# Patient Record
Sex: Female | Born: 1955 | Race: White | Hispanic: No | Smoking: Current every day smoker
Health system: Southern US, Community
[De-identification: ages and names within clinical notes are randomized; demographics above are authoritative.]

## PROBLEM LIST (undated history)

## (undated) DIAGNOSIS — F319 Bipolar disorder, unspecified: Secondary | ICD-10-CM

## (undated) DIAGNOSIS — F309 Manic episode, unspecified: Secondary | ICD-10-CM

## (undated) DIAGNOSIS — M6281 Muscle weakness (generalized): Secondary | ICD-10-CM

## (undated) DIAGNOSIS — F419 Anxiety disorder, unspecified: Secondary | ICD-10-CM

## (undated) DIAGNOSIS — R52 Pain, unspecified: Secondary | ICD-10-CM

## (undated) DIAGNOSIS — J449 Chronic obstructive pulmonary disease, unspecified: Secondary | ICD-10-CM

## (undated) DIAGNOSIS — J45909 Unspecified asthma, uncomplicated: Secondary | ICD-10-CM

## (undated) DIAGNOSIS — I10 Essential (primary) hypertension: Secondary | ICD-10-CM

## (undated) DIAGNOSIS — R569 Unspecified convulsions: Secondary | ICD-10-CM

## (undated) DIAGNOSIS — K5900 Constipation, unspecified: Secondary | ICD-10-CM

## (undated) HISTORY — DX: Anxiety disorder, unspecified: F41.9

## (undated) HISTORY — DX: Pain, unspecified: R52

## (undated) HISTORY — DX: Muscle weakness (generalized): M62.81

## (undated) HISTORY — DX: Unspecified asthma, uncomplicated: J45.909

## (undated) HISTORY — DX: Chronic obstructive pulmonary disease, unspecified: J44.9

## (undated) HISTORY — PX: APPENDECTOMY: SHX54

## (undated) HISTORY — PX: WRIST SURGERY: SHX841

## (undated) HISTORY — PX: OTHER SURGICAL HISTORY: SHX169

## (undated) HISTORY — DX: Manic episode, unspecified: F30.9

## (undated) HISTORY — DX: Essential (primary) hypertension: I10

## (undated) HISTORY — PX: BACK SURGERY: SHX140

---

## 2012-03-09 HISTORY — PX: HIP FRACTURE SURGERY: SHX118

## 2014-03-13 DIAGNOSIS — R52 Pain, unspecified: Secondary | ICD-10-CM | POA: Diagnosis not present

## 2014-03-13 DIAGNOSIS — M25511 Pain in right shoulder: Secondary | ICD-10-CM | POA: Diagnosis not present

## 2014-03-13 DIAGNOSIS — M25561 Pain in right knee: Secondary | ICD-10-CM | POA: Diagnosis not present

## 2014-03-13 DIAGNOSIS — M79621 Pain in right upper arm: Secondary | ICD-10-CM | POA: Diagnosis not present

## 2014-03-13 DIAGNOSIS — F319 Bipolar disorder, unspecified: Secondary | ICD-10-CM | POA: Diagnosis not present

## 2014-03-13 DIAGNOSIS — F4325 Adjustment disorder with mixed disturbance of emotions and conduct: Secondary | ICD-10-CM | POA: Diagnosis not present

## 2014-03-15 DIAGNOSIS — R404 Transient alteration of awareness: Secondary | ICD-10-CM | POA: Diagnosis not present

## 2014-03-15 DIAGNOSIS — F319 Bipolar disorder, unspecified: Secondary | ICD-10-CM | POA: Diagnosis not present

## 2014-03-15 DIAGNOSIS — Z720 Tobacco use: Secondary | ICD-10-CM | POA: Diagnosis not present

## 2014-03-20 DIAGNOSIS — R825 Elevated urine levels of drugs, medicaments and biological substances: Secondary | ICD-10-CM | POA: Diagnosis not present

## 2014-03-27 DIAGNOSIS — R400 Somnolence: Secondary | ICD-10-CM | POA: Diagnosis not present

## 2014-03-28 DIAGNOSIS — F4325 Adjustment disorder with mixed disturbance of emotions and conduct: Secondary | ICD-10-CM | POA: Diagnosis not present

## 2014-03-28 DIAGNOSIS — F319 Bipolar disorder, unspecified: Secondary | ICD-10-CM | POA: Diagnosis not present

## 2014-04-10 DIAGNOSIS — F319 Bipolar disorder, unspecified: Secondary | ICD-10-CM | POA: Diagnosis not present

## 2014-04-10 DIAGNOSIS — F4325 Adjustment disorder with mixed disturbance of emotions and conduct: Secondary | ICD-10-CM | POA: Diagnosis not present

## 2014-04-24 DIAGNOSIS — R52 Pain, unspecified: Secondary | ICD-10-CM | POA: Diagnosis not present

## 2014-04-24 DIAGNOSIS — K5909 Other constipation: Secondary | ICD-10-CM | POA: Diagnosis not present

## 2014-04-24 DIAGNOSIS — S82141D Displaced bicondylar fracture of right tibia, subsequent encounter for closed fracture with routine healing: Secondary | ICD-10-CM | POA: Diagnosis not present

## 2014-04-24 DIAGNOSIS — F319 Bipolar disorder, unspecified: Secondary | ICD-10-CM | POA: Diagnosis not present

## 2014-04-27 DIAGNOSIS — R52 Pain, unspecified: Secondary | ICD-10-CM | POA: Diagnosis not present

## 2014-04-27 DIAGNOSIS — F319 Bipolar disorder, unspecified: Secondary | ICD-10-CM | POA: Diagnosis not present

## 2014-04-27 DIAGNOSIS — J449 Chronic obstructive pulmonary disease, unspecified: Secondary | ICD-10-CM | POA: Diagnosis not present

## 2014-04-27 DIAGNOSIS — R251 Tremor, unspecified: Secondary | ICD-10-CM | POA: Diagnosis not present

## 2014-05-01 DIAGNOSIS — Z5181 Encounter for therapeutic drug level monitoring: Secondary | ICD-10-CM | POA: Diagnosis not present

## 2014-05-01 DIAGNOSIS — R0602 Shortness of breath: Secondary | ICD-10-CM | POA: Diagnosis not present

## 2014-05-01 DIAGNOSIS — F3132 Bipolar disorder, current episode depressed, moderate: Secondary | ICD-10-CM | POA: Diagnosis not present

## 2014-05-01 DIAGNOSIS — E785 Hyperlipidemia, unspecified: Secondary | ICD-10-CM | POA: Diagnosis not present

## 2014-05-01 DIAGNOSIS — Z79899 Other long term (current) drug therapy: Secondary | ICD-10-CM | POA: Diagnosis not present

## 2014-05-05 DIAGNOSIS — R030 Elevated blood-pressure reading, without diagnosis of hypertension: Secondary | ICD-10-CM | POA: Diagnosis not present

## 2014-05-05 DIAGNOSIS — G4700 Insomnia, unspecified: Secondary | ICD-10-CM | POA: Diagnosis not present

## 2014-05-05 DIAGNOSIS — R51 Headache: Secondary | ICD-10-CM | POA: Diagnosis not present

## 2014-05-05 DIAGNOSIS — Z79899 Other long term (current) drug therapy: Secondary | ICD-10-CM | POA: Diagnosis not present

## 2014-05-05 DIAGNOSIS — R531 Weakness: Secondary | ICD-10-CM | POA: Diagnosis not present

## 2014-05-05 DIAGNOSIS — G8929 Other chronic pain: Secondary | ICD-10-CM | POA: Diagnosis not present

## 2014-05-05 DIAGNOSIS — S0990XA Unspecified injury of head, initial encounter: Secondary | ICD-10-CM | POA: Diagnosis not present

## 2014-05-05 DIAGNOSIS — F419 Anxiety disorder, unspecified: Secondary | ICD-10-CM | POA: Diagnosis not present

## 2014-05-05 DIAGNOSIS — R262 Difficulty in walking, not elsewhere classified: Secondary | ICD-10-CM | POA: Diagnosis not present

## 2014-05-05 DIAGNOSIS — Z79891 Long term (current) use of opiate analgesic: Secondary | ICD-10-CM | POA: Diagnosis not present

## 2014-05-05 DIAGNOSIS — Z043 Encounter for examination and observation following other accident: Secondary | ICD-10-CM | POA: Diagnosis not present

## 2014-05-05 DIAGNOSIS — F329 Major depressive disorder, single episode, unspecified: Secondary | ICD-10-CM | POA: Diagnosis not present

## 2014-05-05 DIAGNOSIS — J449 Chronic obstructive pulmonary disease, unspecified: Secondary | ICD-10-CM | POA: Diagnosis not present

## 2014-05-05 DIAGNOSIS — Z720 Tobacco use: Secondary | ICD-10-CM | POA: Diagnosis not present

## 2014-05-06 DIAGNOSIS — Z79899 Other long term (current) drug therapy: Secondary | ICD-10-CM | POA: Diagnosis not present

## 2014-05-06 DIAGNOSIS — S40011A Contusion of right shoulder, initial encounter: Secondary | ICD-10-CM | POA: Diagnosis not present

## 2014-05-06 DIAGNOSIS — S4991XA Unspecified injury of right shoulder and upper arm, initial encounter: Secondary | ICD-10-CM | POA: Diagnosis not present

## 2014-05-06 DIAGNOSIS — R531 Weakness: Secondary | ICD-10-CM | POA: Diagnosis not present

## 2014-05-06 DIAGNOSIS — T426X1A Poisoning by other antiepileptic and sedative-hypnotic drugs, accidental (unintentional), initial encounter: Secondary | ICD-10-CM | POA: Diagnosis not present

## 2014-05-06 DIAGNOSIS — F419 Anxiety disorder, unspecified: Secondary | ICD-10-CM | POA: Diagnosis not present

## 2014-05-06 DIAGNOSIS — S0990XA Unspecified injury of head, initial encounter: Secondary | ICD-10-CM | POA: Diagnosis not present

## 2014-05-06 DIAGNOSIS — R7989 Other specified abnormal findings of blood chemistry: Secondary | ICD-10-CM | POA: Diagnosis not present

## 2014-05-06 DIAGNOSIS — S299XXA Unspecified injury of thorax, initial encounter: Secondary | ICD-10-CM | POA: Diagnosis not present

## 2014-05-06 DIAGNOSIS — J449 Chronic obstructive pulmonary disease, unspecified: Secondary | ICD-10-CM | POA: Diagnosis not present

## 2014-05-06 DIAGNOSIS — Z720 Tobacco use: Secondary | ICD-10-CM | POA: Diagnosis not present

## 2014-05-06 DIAGNOSIS — F319 Bipolar disorder, unspecified: Secondary | ICD-10-CM | POA: Diagnosis not present

## 2014-05-06 DIAGNOSIS — S20211A Contusion of right front wall of thorax, initial encounter: Secondary | ICD-10-CM | POA: Diagnosis not present

## 2014-05-06 DIAGNOSIS — G894 Chronic pain syndrome: Secondary | ICD-10-CM | POA: Diagnosis not present

## 2014-05-06 DIAGNOSIS — M25511 Pain in right shoulder: Secondary | ICD-10-CM | POA: Diagnosis not present

## 2014-05-07 DIAGNOSIS — F319 Bipolar disorder, unspecified: Secondary | ICD-10-CM | POA: Diagnosis not present

## 2014-05-07 DIAGNOSIS — F419 Anxiety disorder, unspecified: Secondary | ICD-10-CM | POA: Diagnosis not present

## 2014-05-08 DIAGNOSIS — K5900 Constipation, unspecified: Secondary | ICD-10-CM | POA: Diagnosis not present

## 2014-05-08 DIAGNOSIS — F319 Bipolar disorder, unspecified: Secondary | ICD-10-CM | POA: Diagnosis not present

## 2014-05-08 DIAGNOSIS — M6281 Muscle weakness (generalized): Secondary | ICD-10-CM | POA: Diagnosis not present

## 2014-05-08 DIAGNOSIS — G4700 Insomnia, unspecified: Secondary | ICD-10-CM | POA: Diagnosis not present

## 2014-05-08 DIAGNOSIS — Z01419 Encounter for gynecological examination (general) (routine) without abnormal findings: Secondary | ICD-10-CM | POA: Diagnosis not present

## 2014-05-08 DIAGNOSIS — F419 Anxiety disorder, unspecified: Secondary | ICD-10-CM | POA: Diagnosis not present

## 2014-05-08 DIAGNOSIS — Z9181 History of falling: Secondary | ICD-10-CM | POA: Diagnosis not present

## 2014-05-08 DIAGNOSIS — R262 Difficulty in walking, not elsewhere classified: Secondary | ICD-10-CM | POA: Diagnosis not present

## 2014-05-08 DIAGNOSIS — Z1231 Encounter for screening mammogram for malignant neoplasm of breast: Secondary | ICD-10-CM | POA: Diagnosis not present

## 2014-05-10 DIAGNOSIS — Z9181 History of falling: Secondary | ICD-10-CM | POA: Diagnosis not present

## 2014-05-10 DIAGNOSIS — R262 Difficulty in walking, not elsewhere classified: Secondary | ICD-10-CM | POA: Diagnosis not present

## 2014-05-10 DIAGNOSIS — Z1231 Encounter for screening mammogram for malignant neoplasm of breast: Secondary | ICD-10-CM | POA: Diagnosis not present

## 2014-05-10 DIAGNOSIS — R251 Tremor, unspecified: Secondary | ICD-10-CM | POA: Diagnosis not present

## 2014-05-10 DIAGNOSIS — M25521 Pain in right elbow: Secondary | ICD-10-CM | POA: Diagnosis not present

## 2014-05-10 DIAGNOSIS — M6281 Muscle weakness (generalized): Secondary | ICD-10-CM | POA: Diagnosis not present

## 2014-05-14 DIAGNOSIS — R262 Difficulty in walking, not elsewhere classified: Secondary | ICD-10-CM | POA: Diagnosis not present

## 2014-05-14 DIAGNOSIS — M6281 Muscle weakness (generalized): Secondary | ICD-10-CM | POA: Diagnosis not present

## 2014-05-14 DIAGNOSIS — Z9181 History of falling: Secondary | ICD-10-CM | POA: Diagnosis not present

## 2014-05-16 DIAGNOSIS — R262 Difficulty in walking, not elsewhere classified: Secondary | ICD-10-CM | POA: Diagnosis not present

## 2014-05-16 DIAGNOSIS — M6281 Muscle weakness (generalized): Secondary | ICD-10-CM | POA: Diagnosis not present

## 2014-05-16 DIAGNOSIS — R569 Unspecified convulsions: Secondary | ICD-10-CM | POA: Diagnosis not present

## 2014-05-16 DIAGNOSIS — Z9181 History of falling: Secondary | ICD-10-CM | POA: Diagnosis not present

## 2014-05-21 DIAGNOSIS — R262 Difficulty in walking, not elsewhere classified: Secondary | ICD-10-CM | POA: Diagnosis not present

## 2014-05-21 DIAGNOSIS — M6281 Muscle weakness (generalized): Secondary | ICD-10-CM | POA: Diagnosis not present

## 2014-05-21 DIAGNOSIS — Z9181 History of falling: Secondary | ICD-10-CM | POA: Diagnosis not present

## 2014-05-24 DIAGNOSIS — Z9181 History of falling: Secondary | ICD-10-CM | POA: Diagnosis not present

## 2014-05-24 DIAGNOSIS — M6281 Muscle weakness (generalized): Secondary | ICD-10-CM | POA: Diagnosis not present

## 2014-05-24 DIAGNOSIS — R262 Difficulty in walking, not elsewhere classified: Secondary | ICD-10-CM | POA: Diagnosis not present

## 2014-05-28 DIAGNOSIS — F419 Anxiety disorder, unspecified: Secondary | ICD-10-CM | POA: Diagnosis not present

## 2014-05-28 DIAGNOSIS — F319 Bipolar disorder, unspecified: Secondary | ICD-10-CM | POA: Diagnosis not present

## 2014-05-29 DIAGNOSIS — M6281 Muscle weakness (generalized): Secondary | ICD-10-CM | POA: Diagnosis not present

## 2014-05-29 DIAGNOSIS — R262 Difficulty in walking, not elsewhere classified: Secondary | ICD-10-CM | POA: Diagnosis not present

## 2014-05-30 DIAGNOSIS — F3132 Bipolar disorder, current episode depressed, moderate: Secondary | ICD-10-CM | POA: Diagnosis not present

## 2014-05-30 DIAGNOSIS — Z79899 Other long term (current) drug therapy: Secondary | ICD-10-CM | POA: Diagnosis not present

## 2014-05-30 DIAGNOSIS — Z5181 Encounter for therapeutic drug level monitoring: Secondary | ICD-10-CM | POA: Diagnosis not present

## 2014-05-30 DIAGNOSIS — R0602 Shortness of breath: Secondary | ICD-10-CM | POA: Diagnosis not present

## 2014-05-30 DIAGNOSIS — E785 Hyperlipidemia, unspecified: Secondary | ICD-10-CM | POA: Diagnosis not present

## 2014-06-06 DIAGNOSIS — Z79891 Long term (current) use of opiate analgesic: Secondary | ICD-10-CM | POA: Diagnosis not present

## 2014-06-06 DIAGNOSIS — S8991XA Unspecified injury of right lower leg, initial encounter: Secondary | ICD-10-CM | POA: Diagnosis not present

## 2014-06-06 DIAGNOSIS — Z23 Encounter for immunization: Secondary | ICD-10-CM | POA: Diagnosis not present

## 2014-06-06 DIAGNOSIS — Z7982 Long term (current) use of aspirin: Secondary | ICD-10-CM | POA: Diagnosis not present

## 2014-06-06 DIAGNOSIS — D62 Acute posthemorrhagic anemia: Secondary | ICD-10-CM | POA: Diagnosis not present

## 2014-06-06 DIAGNOSIS — W19XXXD Unspecified fall, subsequent encounter: Secondary | ICD-10-CM | POA: Diagnosis not present

## 2014-06-06 DIAGNOSIS — S299XXA Unspecified injury of thorax, initial encounter: Secondary | ICD-10-CM | POA: Diagnosis not present

## 2014-06-06 DIAGNOSIS — R1311 Dysphagia, oral phase: Secondary | ICD-10-CM | POA: Diagnosis not present

## 2014-06-06 DIAGNOSIS — S72414A Nondisplaced unspecified condyle fracture of lower end of right femur, initial encounter for closed fracture: Secondary | ICD-10-CM | POA: Diagnosis not present

## 2014-06-06 DIAGNOSIS — S72141A Displaced intertrochanteric fracture of right femur, initial encounter for closed fracture: Secondary | ICD-10-CM | POA: Diagnosis not present

## 2014-06-06 DIAGNOSIS — I739 Peripheral vascular disease, unspecified: Secondary | ICD-10-CM | POA: Diagnosis not present

## 2014-06-06 DIAGNOSIS — W19XXXA Unspecified fall, initial encounter: Secondary | ICD-10-CM | POA: Diagnosis not present

## 2014-06-06 DIAGNOSIS — R404 Transient alteration of awareness: Secondary | ICD-10-CM | POA: Diagnosis not present

## 2014-06-06 DIAGNOSIS — M25561 Pain in right knee: Secondary | ICD-10-CM | POA: Diagnosis not present

## 2014-06-06 DIAGNOSIS — S199XXA Unspecified injury of neck, initial encounter: Secondary | ICD-10-CM | POA: Diagnosis not present

## 2014-06-06 DIAGNOSIS — Z8709 Personal history of other diseases of the respiratory system: Secondary | ICD-10-CM | POA: Diagnosis not present

## 2014-06-06 DIAGNOSIS — S79911A Unspecified injury of right hip, initial encounter: Secondary | ICD-10-CM | POA: Diagnosis not present

## 2014-06-06 DIAGNOSIS — Z87891 Personal history of nicotine dependence: Secondary | ICD-10-CM | POA: Diagnosis not present

## 2014-06-06 DIAGNOSIS — M6281 Muscle weakness (generalized): Secondary | ICD-10-CM | POA: Diagnosis not present

## 2014-06-06 DIAGNOSIS — F418 Other specified anxiety disorders: Secondary | ICD-10-CM | POA: Diagnosis not present

## 2014-06-06 DIAGNOSIS — Z79899 Other long term (current) drug therapy: Secondary | ICD-10-CM | POA: Diagnosis not present

## 2014-06-06 DIAGNOSIS — F0390 Unspecified dementia without behavioral disturbance: Secondary | ICD-10-CM | POA: Diagnosis not present

## 2014-06-06 DIAGNOSIS — J449 Chronic obstructive pulmonary disease, unspecified: Secondary | ICD-10-CM | POA: Diagnosis not present

## 2014-06-06 DIAGNOSIS — M25559 Pain in unspecified hip: Secondary | ICD-10-CM | POA: Diagnosis not present

## 2014-06-06 DIAGNOSIS — R262 Difficulty in walking, not elsewhere classified: Secondary | ICD-10-CM | POA: Diagnosis not present

## 2014-06-06 DIAGNOSIS — Z9181 History of falling: Secondary | ICD-10-CM | POA: Diagnosis not present

## 2014-06-06 DIAGNOSIS — D649 Anemia, unspecified: Secondary | ICD-10-CM | POA: Diagnosis not present

## 2014-06-06 DIAGNOSIS — Z7401 Bed confinement status: Secondary | ICD-10-CM | POA: Diagnosis not present

## 2014-06-06 DIAGNOSIS — F319 Bipolar disorder, unspecified: Secondary | ICD-10-CM | POA: Diagnosis not present

## 2014-06-06 DIAGNOSIS — S72141D Displaced intertrochanteric fracture of right femur, subsequent encounter for closed fracture with routine healing: Secondary | ICD-10-CM | POA: Diagnosis not present

## 2014-06-12 DIAGNOSIS — I10 Essential (primary) hypertension: Secondary | ICD-10-CM | POA: Diagnosis not present

## 2014-06-12 DIAGNOSIS — D62 Acute posthemorrhagic anemia: Secondary | ICD-10-CM | POA: Diagnosis not present

## 2014-06-12 DIAGNOSIS — F411 Generalized anxiety disorder: Secondary | ICD-10-CM | POA: Diagnosis not present

## 2014-06-12 DIAGNOSIS — Z8709 Personal history of other diseases of the respiratory system: Secondary | ICD-10-CM | POA: Diagnosis not present

## 2014-06-12 DIAGNOSIS — S72141A Displaced intertrochanteric fracture of right femur, initial encounter for closed fracture: Secondary | ICD-10-CM | POA: Diagnosis not present

## 2014-06-12 DIAGNOSIS — M6281 Muscle weakness (generalized): Secondary | ICD-10-CM | POA: Diagnosis not present

## 2014-06-12 DIAGNOSIS — Z7401 Bed confinement status: Secondary | ICD-10-CM | POA: Diagnosis not present

## 2014-06-12 DIAGNOSIS — J449 Chronic obstructive pulmonary disease, unspecified: Secondary | ICD-10-CM | POA: Diagnosis not present

## 2014-06-12 DIAGNOSIS — W19XXXD Unspecified fall, subsequent encounter: Secondary | ICD-10-CM | POA: Diagnosis not present

## 2014-06-12 DIAGNOSIS — G20 Parkinson's disease: Secondary | ICD-10-CM | POA: Diagnosis not present

## 2014-06-12 DIAGNOSIS — I2510 Atherosclerotic heart disease of native coronary artery without angina pectoris: Secondary | ICD-10-CM | POA: Diagnosis not present

## 2014-06-12 DIAGNOSIS — S72414A Nondisplaced unspecified condyle fracture of lower end of right femur, initial encounter for closed fracture: Secondary | ICD-10-CM | POA: Diagnosis not present

## 2014-06-12 DIAGNOSIS — Z87891 Personal history of nicotine dependence: Secondary | ICD-10-CM | POA: Diagnosis not present

## 2014-06-12 DIAGNOSIS — Z79899 Other long term (current) drug therapy: Secondary | ICD-10-CM | POA: Diagnosis not present

## 2014-06-12 DIAGNOSIS — D649 Anemia, unspecified: Secondary | ICD-10-CM | POA: Diagnosis not present

## 2014-06-12 DIAGNOSIS — S72141D Displaced intertrochanteric fracture of right femur, subsequent encounter for closed fracture with routine healing: Secondary | ICD-10-CM | POA: Diagnosis not present

## 2014-06-12 DIAGNOSIS — Z9181 History of falling: Secondary | ICD-10-CM | POA: Diagnosis not present

## 2014-06-12 DIAGNOSIS — R262 Difficulty in walking, not elsewhere classified: Secondary | ICD-10-CM | POA: Diagnosis not present

## 2014-06-12 DIAGNOSIS — F418 Other specified anxiety disorders: Secondary | ICD-10-CM | POA: Diagnosis not present

## 2014-06-12 DIAGNOSIS — M25559 Pain in unspecified hip: Secondary | ICD-10-CM | POA: Diagnosis not present

## 2014-06-12 DIAGNOSIS — F319 Bipolar disorder, unspecified: Secondary | ICD-10-CM | POA: Diagnosis not present

## 2014-06-12 DIAGNOSIS — Z7982 Long term (current) use of aspirin: Secondary | ICD-10-CM | POA: Diagnosis not present

## 2014-06-12 DIAGNOSIS — Z79891 Long term (current) use of opiate analgesic: Secondary | ICD-10-CM | POA: Diagnosis not present

## 2014-06-12 DIAGNOSIS — R1311 Dysphagia, oral phase: Secondary | ICD-10-CM | POA: Diagnosis not present

## 2014-06-12 DIAGNOSIS — R404 Transient alteration of awareness: Secondary | ICD-10-CM | POA: Diagnosis not present

## 2014-06-12 DIAGNOSIS — Z23 Encounter for immunization: Secondary | ICD-10-CM | POA: Diagnosis not present

## 2014-06-14 DIAGNOSIS — S72141D Displaced intertrochanteric fracture of right femur, subsequent encounter for closed fracture with routine healing: Secondary | ICD-10-CM | POA: Diagnosis not present

## 2014-06-14 DIAGNOSIS — I10 Essential (primary) hypertension: Secondary | ICD-10-CM | POA: Diagnosis not present

## 2014-06-14 DIAGNOSIS — D649 Anemia, unspecified: Secondary | ICD-10-CM | POA: Diagnosis not present

## 2014-06-14 DIAGNOSIS — J449 Chronic obstructive pulmonary disease, unspecified: Secondary | ICD-10-CM | POA: Diagnosis not present

## 2014-07-04 DIAGNOSIS — G20 Parkinson's disease: Secondary | ICD-10-CM | POA: Diagnosis not present

## 2014-07-04 DIAGNOSIS — F411 Generalized anxiety disorder: Secondary | ICD-10-CM | POA: Diagnosis not present

## 2014-07-04 DIAGNOSIS — I10 Essential (primary) hypertension: Secondary | ICD-10-CM | POA: Diagnosis not present

## 2014-07-04 DIAGNOSIS — S72141D Displaced intertrochanteric fracture of right femur, subsequent encounter for closed fracture with routine healing: Secondary | ICD-10-CM | POA: Diagnosis not present

## 2014-07-09 DIAGNOSIS — D649 Anemia, unspecified: Secondary | ICD-10-CM | POA: Diagnosis not present

## 2014-07-09 DIAGNOSIS — I2510 Atherosclerotic heart disease of native coronary artery without angina pectoris: Secondary | ICD-10-CM | POA: Diagnosis not present

## 2014-07-09 DIAGNOSIS — F319 Bipolar disorder, unspecified: Secondary | ICD-10-CM | POA: Diagnosis not present

## 2014-07-12 DIAGNOSIS — S72141D Displaced intertrochanteric fracture of right femur, subsequent encounter for closed fracture with routine healing: Secondary | ICD-10-CM | POA: Diagnosis not present

## 2014-07-12 DIAGNOSIS — I10 Essential (primary) hypertension: Secondary | ICD-10-CM | POA: Diagnosis not present

## 2014-07-12 DIAGNOSIS — J449 Chronic obstructive pulmonary disease, unspecified: Secondary | ICD-10-CM | POA: Diagnosis not present

## 2014-07-12 DIAGNOSIS — D649 Anemia, unspecified: Secondary | ICD-10-CM | POA: Diagnosis not present

## 2014-07-17 DIAGNOSIS — M25552 Pain in left hip: Secondary | ICD-10-CM | POA: Diagnosis not present

## 2014-07-17 DIAGNOSIS — S299XXA Unspecified injury of thorax, initial encounter: Secondary | ICD-10-CM | POA: Diagnosis not present

## 2014-07-17 DIAGNOSIS — S79911A Unspecified injury of right hip, initial encounter: Secondary | ICD-10-CM | POA: Diagnosis not present

## 2014-07-17 DIAGNOSIS — Z9181 History of falling: Secondary | ICD-10-CM | POA: Diagnosis not present

## 2014-07-17 DIAGNOSIS — S7001XA Contusion of right hip, initial encounter: Secondary | ICD-10-CM | POA: Diagnosis not present

## 2014-07-17 DIAGNOSIS — S79912A Unspecified injury of left hip, initial encounter: Secondary | ICD-10-CM | POA: Diagnosis not present

## 2014-07-17 DIAGNOSIS — R51 Headache: Secondary | ICD-10-CM | POA: Diagnosis not present

## 2014-07-17 DIAGNOSIS — G319 Degenerative disease of nervous system, unspecified: Secondary | ICD-10-CM | POA: Diagnosis not present

## 2014-07-17 DIAGNOSIS — S0990XA Unspecified injury of head, initial encounter: Secondary | ICD-10-CM | POA: Diagnosis not present

## 2014-07-17 DIAGNOSIS — M79651 Pain in right thigh: Secondary | ICD-10-CM | POA: Diagnosis not present

## 2014-07-17 DIAGNOSIS — S79921A Unspecified injury of right thigh, initial encounter: Secondary | ICD-10-CM | POA: Diagnosis not present

## 2014-07-17 DIAGNOSIS — M25551 Pain in right hip: Secondary | ICD-10-CM | POA: Diagnosis not present

## 2014-07-26 DIAGNOSIS — F319 Bipolar disorder, unspecified: Secondary | ICD-10-CM | POA: Diagnosis not present

## 2014-07-26 DIAGNOSIS — F419 Anxiety disorder, unspecified: Secondary | ICD-10-CM | POA: Diagnosis not present

## 2014-07-27 DIAGNOSIS — J449 Chronic obstructive pulmonary disease, unspecified: Secondary | ICD-10-CM | POA: Diagnosis not present

## 2014-07-27 DIAGNOSIS — R269 Unspecified abnormalities of gait and mobility: Secondary | ICD-10-CM | POA: Diagnosis not present

## 2014-07-27 DIAGNOSIS — M25551 Pain in right hip: Secondary | ICD-10-CM | POA: Diagnosis not present

## 2014-07-27 DIAGNOSIS — F319 Bipolar disorder, unspecified: Secondary | ICD-10-CM | POA: Diagnosis not present

## 2014-08-23 DIAGNOSIS — F319 Bipolar disorder, unspecified: Secondary | ICD-10-CM | POA: Diagnosis not present

## 2014-08-23 DIAGNOSIS — I10 Essential (primary) hypertension: Secondary | ICD-10-CM | POA: Diagnosis not present

## 2014-08-23 DIAGNOSIS — R251 Tremor, unspecified: Secondary | ICD-10-CM | POA: Diagnosis not present

## 2014-08-23 DIAGNOSIS — J449 Chronic obstructive pulmonary disease, unspecified: Secondary | ICD-10-CM | POA: Diagnosis not present

## 2014-08-27 DIAGNOSIS — B351 Tinea unguium: Secondary | ICD-10-CM | POA: Diagnosis not present

## 2014-08-27 DIAGNOSIS — E7220 Disorder of urea cycle metabolism, unspecified: Secondary | ICD-10-CM | POA: Diagnosis not present

## 2014-08-27 DIAGNOSIS — Z79899 Other long term (current) drug therapy: Secondary | ICD-10-CM | POA: Diagnosis not present

## 2014-08-27 DIAGNOSIS — Z5181 Encounter for therapeutic drug level monitoring: Secondary | ICD-10-CM | POA: Diagnosis not present

## 2014-08-27 DIAGNOSIS — M79675 Pain in left toe(s): Secondary | ICD-10-CM | POA: Diagnosis not present

## 2014-08-27 DIAGNOSIS — M79674 Pain in right toe(s): Secondary | ICD-10-CM | POA: Diagnosis not present

## 2014-08-27 DIAGNOSIS — I10 Essential (primary) hypertension: Secondary | ICD-10-CM | POA: Diagnosis not present

## 2014-09-03 DIAGNOSIS — F319 Bipolar disorder, unspecified: Secondary | ICD-10-CM | POA: Diagnosis not present

## 2014-09-03 DIAGNOSIS — F419 Anxiety disorder, unspecified: Secondary | ICD-10-CM | POA: Diagnosis not present

## 2014-09-28 DIAGNOSIS — J449 Chronic obstructive pulmonary disease, unspecified: Secondary | ICD-10-CM | POA: Diagnosis not present

## 2014-09-28 DIAGNOSIS — I10 Essential (primary) hypertension: Secondary | ICD-10-CM | POA: Diagnosis not present

## 2014-10-03 DIAGNOSIS — F319 Bipolar disorder, unspecified: Secondary | ICD-10-CM | POA: Diagnosis not present

## 2014-10-03 DIAGNOSIS — F419 Anxiety disorder, unspecified: Secondary | ICD-10-CM | POA: Diagnosis not present

## 2014-10-19 DIAGNOSIS — S62613A Displaced fracture of proximal phalanx of left middle finger, initial encounter for closed fracture: Secondary | ICD-10-CM | POA: Diagnosis not present

## 2014-10-19 DIAGNOSIS — S0180XA Unspecified open wound of other part of head, initial encounter: Secondary | ICD-10-CM | POA: Diagnosis not present

## 2014-10-19 DIAGNOSIS — S0990XA Unspecified injury of head, initial encounter: Secondary | ICD-10-CM | POA: Diagnosis not present

## 2014-10-19 DIAGNOSIS — F419 Anxiety disorder, unspecified: Secondary | ICD-10-CM | POA: Diagnosis not present

## 2014-10-19 DIAGNOSIS — S6992XA Unspecified injury of left wrist, hand and finger(s), initial encounter: Secondary | ICD-10-CM | POA: Diagnosis not present

## 2014-10-19 DIAGNOSIS — S62623A Displaced fracture of medial phalanx of left middle finger, initial encounter for closed fracture: Secondary | ICD-10-CM | POA: Diagnosis not present

## 2014-10-19 DIAGNOSIS — F319 Bipolar disorder, unspecified: Secondary | ICD-10-CM | POA: Diagnosis not present

## 2014-10-19 DIAGNOSIS — Z79899 Other long term (current) drug therapy: Secondary | ICD-10-CM | POA: Diagnosis not present

## 2014-10-19 DIAGNOSIS — S0101XA Laceration without foreign body of scalp, initial encounter: Secondary | ICD-10-CM | POA: Diagnosis not present

## 2014-10-19 DIAGNOSIS — J449 Chronic obstructive pulmonary disease, unspecified: Secondary | ICD-10-CM | POA: Diagnosis not present

## 2014-10-23 DIAGNOSIS — S7001XA Contusion of right hip, initial encounter: Secondary | ICD-10-CM | POA: Diagnosis not present

## 2014-10-23 DIAGNOSIS — M25521 Pain in right elbow: Secondary | ICD-10-CM | POA: Diagnosis not present

## 2014-10-23 DIAGNOSIS — S61209A Unspecified open wound of unspecified finger without damage to nail, initial encounter: Secondary | ICD-10-CM | POA: Diagnosis not present

## 2014-10-23 DIAGNOSIS — M25551 Pain in right hip: Secondary | ICD-10-CM | POA: Diagnosis not present

## 2014-10-25 DIAGNOSIS — S0100XD Unspecified open wound of scalp, subsequent encounter: Secondary | ICD-10-CM | POA: Diagnosis not present

## 2014-10-25 DIAGNOSIS — S7001XD Contusion of right hip, subsequent encounter: Secondary | ICD-10-CM | POA: Diagnosis not present

## 2014-10-25 DIAGNOSIS — I10 Essential (primary) hypertension: Secondary | ICD-10-CM | POA: Diagnosis not present

## 2014-10-30 DIAGNOSIS — F319 Bipolar disorder, unspecified: Secondary | ICD-10-CM | POA: Diagnosis not present

## 2014-10-30 DIAGNOSIS — F419 Anxiety disorder, unspecified: Secondary | ICD-10-CM | POA: Diagnosis not present

## 2014-11-02 DIAGNOSIS — F419 Anxiety disorder, unspecified: Secondary | ICD-10-CM | POA: Diagnosis not present

## 2014-11-02 DIAGNOSIS — F319 Bipolar disorder, unspecified: Secondary | ICD-10-CM | POA: Diagnosis not present

## 2014-11-06 DIAGNOSIS — S61209D Unspecified open wound of unspecified finger without damage to nail, subsequent encounter: Secondary | ICD-10-CM | POA: Diagnosis not present

## 2014-11-06 DIAGNOSIS — F419 Anxiety disorder, unspecified: Secondary | ICD-10-CM | POA: Diagnosis not present

## 2014-11-06 DIAGNOSIS — F319 Bipolar disorder, unspecified: Secondary | ICD-10-CM | POA: Diagnosis not present

## 2014-11-06 DIAGNOSIS — M79674 Pain in right toe(s): Secondary | ICD-10-CM | POA: Diagnosis not present

## 2014-11-06 DIAGNOSIS — B351 Tinea unguium: Secondary | ICD-10-CM | POA: Diagnosis not present

## 2014-11-06 DIAGNOSIS — S7001XD Contusion of right hip, subsequent encounter: Secondary | ICD-10-CM | POA: Diagnosis not present

## 2014-11-06 DIAGNOSIS — M25521 Pain in right elbow: Secondary | ICD-10-CM | POA: Diagnosis not present

## 2014-11-06 DIAGNOSIS — M79675 Pain in left toe(s): Secondary | ICD-10-CM | POA: Diagnosis not present

## 2014-11-07 DIAGNOSIS — F419 Anxiety disorder, unspecified: Secondary | ICD-10-CM | POA: Diagnosis not present

## 2014-11-07 DIAGNOSIS — F319 Bipolar disorder, unspecified: Secondary | ICD-10-CM | POA: Diagnosis not present

## 2014-11-13 DIAGNOSIS — F319 Bipolar disorder, unspecified: Secondary | ICD-10-CM | POA: Diagnosis not present

## 2014-11-13 DIAGNOSIS — F419 Anxiety disorder, unspecified: Secondary | ICD-10-CM | POA: Diagnosis not present

## 2014-12-05 DIAGNOSIS — J449 Chronic obstructive pulmonary disease, unspecified: Secondary | ICD-10-CM | POA: Diagnosis not present

## 2014-12-05 DIAGNOSIS — M6281 Muscle weakness (generalized): Secondary | ICD-10-CM | POA: Diagnosis not present

## 2014-12-05 DIAGNOSIS — F319 Bipolar disorder, unspecified: Secondary | ICD-10-CM | POA: Diagnosis not present

## 2014-12-05 DIAGNOSIS — F17200 Nicotine dependence, unspecified, uncomplicated: Secondary | ICD-10-CM | POA: Diagnosis not present

## 2014-12-20 ENCOUNTER — Encounter (HOSPITAL_COMMUNITY): Payer: Self-pay

## 2014-12-20 ENCOUNTER — Emergency Department (HOSPITAL_COMMUNITY): Payer: Medicare Other

## 2014-12-20 ENCOUNTER — Emergency Department (HOSPITAL_COMMUNITY)
Admission: EM | Admit: 2014-12-20 | Discharge: 2014-12-20 | Disposition: A | Discharge status: Home / Self Care | Payer: Medicare Other | Attending: Emergency Medicine | Admitting: Emergency Medicine

## 2014-12-20 DIAGNOSIS — Z720 Tobacco use: Secondary | ICD-10-CM | POA: Diagnosis not present

## 2014-12-20 DIAGNOSIS — S20212A Contusion of left front wall of thorax, initial encounter: Secondary | ICD-10-CM | POA: Insufficient documentation

## 2014-12-20 DIAGNOSIS — F319 Bipolar disorder, unspecified: Secondary | ICD-10-CM | POA: Insufficient documentation

## 2014-12-20 DIAGNOSIS — Y92007 Garden or yard of unspecified non-institutional (private) residence as the place of occurrence of the external cause: Secondary | ICD-10-CM | POA: Insufficient documentation

## 2014-12-20 DIAGNOSIS — Z79899 Other long term (current) drug therapy: Secondary | ICD-10-CM | POA: Insufficient documentation

## 2014-12-20 DIAGNOSIS — Y998 Other external cause status: Secondary | ICD-10-CM | POA: Insufficient documentation

## 2014-12-20 DIAGNOSIS — Y9389 Activity, other specified: Secondary | ICD-10-CM | POA: Diagnosis not present

## 2014-12-20 DIAGNOSIS — W010XXA Fall on same level from slipping, tripping and stumbling without subsequent striking against object, initial encounter: Secondary | ICD-10-CM | POA: Diagnosis not present

## 2014-12-20 DIAGNOSIS — S299XXA Unspecified injury of thorax, initial encounter: Secondary | ICD-10-CM | POA: Diagnosis present

## 2014-12-20 DIAGNOSIS — Z7982 Long term (current) use of aspirin: Secondary | ICD-10-CM | POA: Diagnosis not present

## 2014-12-20 DIAGNOSIS — J449 Chronic obstructive pulmonary disease, unspecified: Secondary | ICD-10-CM | POA: Diagnosis not present

## 2014-12-20 HISTORY — DX: Bipolar disorder, unspecified: F31.9

## 2014-12-20 MED ORDER — HYDROCODONE-ACETAMINOPHEN 5-325 MG PO TABS
1.0000 | ORAL_TABLET | Freq: Once | ORAL | Status: AC
  Administered 2014-12-20: 1 via ORAL
  Filled 2014-12-20: qty 1

## 2014-12-20 NOTE — ED Notes (Signed)
Patient states she tripped in her yard and fell onto her left side. Patient c/o rib pain and sob. Denies any other injuries.

## 2014-12-20 NOTE — Discharge Instructions (Signed)
Contusion There is no broken ribs.  Use your tramadol as needed for pain. Followup with your doctor. Return to the ED if you develop new or worsening symptoms. A contusion is a deep bruise. Contusions are the result of a blunt injury to tissues and muscle fibers under the skin. The injury causes bleeding under the skin. The skin overlying the contusion may turn blue, purple, or yellow. Minor injuries will give you a painless contusion, but more severe contusions may stay painful and swollen for a few weeks.  CAUSES  This condition is usually caused by a blow, trauma, or direct force to an area of the body. SYMPTOMS  Symptoms of this condition include:  Swelling of the injured area.  Pain and tenderness in the injured area.  Discoloration. The area may have redness and then turn blue, purple, or yellow. DIAGNOSIS  This condition is diagnosed based on a physical exam and medical history. An X-ray, CT scan, or MRI may be needed to determine if there are any associated injuries, such as broken bones (fractures). TREATMENT  Specific treatment for this condition depends on what area of the body was injured. In general, the best treatment for a contusion is resting, icing, applying pressure to (compression), and elevating the injured area. This is often called the RICE strategy. Over-the-counter anti-inflammatory medicines may also be recommended for pain control.  HOME CARE INSTRUCTIONS   Rest the injured area.  If directed, apply ice to the injured area:  Put ice in a plastic bag.  Place a towel between your skin and the bag.  Leave the ice on for 20 minutes, 2-3 times per day.  If directed, apply light compression to the injured area using an elastic bandage. Make sure the bandage is not wrapped too tightly. Remove and reapply the bandage as directed by your health care provider.  If possible, raise (elevate) the injured area above the level of your heart while you are sitting or lying  down.  Take over-the-counter and prescription medicines only as told by your health care provider. SEEK MEDICAL CARE IF:  Your symptoms do not improve after several days of treatment.  Your symptoms get worse.  You have difficulty moving the injured area. SEEK IMMEDIATE MEDICAL CARE IF:   You have severe pain.  You have numbness in a hand or foot.  Your hand or foot turns pale or cold.   This information is not intended to replace advice given to you by your health care provider. Make sure you discuss any questions you have with your health care provider.   Document Released: 12/03/2004 Document Revised: 11/14/2014 Document Reviewed: 07/11/2014 Elsevier Interactive Patient Education [employer].

## 2014-12-20 NOTE — ED Notes (Signed)
Pt alert & oriented x4, stable gait. Patient  given discharge instructions, paperwork & prescription(s). Patient verbalized understanding. Pt left department w/ no further questions. 

## 2014-12-20 NOTE — ED Provider Notes (Signed)
CSN: [id number]     Arrival date & time 12/20/14  1939 History   First MD Initiated Contact with Patient 12/20/14 2032     Chief Complaint  Patient presents with  . Fall     (Consider location/radiation/quality/duration/timing/severity/associated sxs/prior Treatment) HPI Comments: Mechanical fall over a metal pipe tripping and landing on left side. This happened around 10 AM. Did not hit head or lose consciousness. Complains of pain to left breast, ribs or shortness of breath. Taking Tylenol without relief. Denies head, neck, back, chest or abdominal pain. No blood thinner use.  Patient is a 59 y.o. female presenting with fall. The history is provided by the patient.  Fall Pertinent negatives include no chest pain, no abdominal pain, no headaches and no shortness of breath.    Past Medical History  Diagnosis Date  . Bipolar 1 disorder [facility])    Past Surgical History  Procedure Laterality Date  . Back surgery    . Appendectomy     History reviewed. No pertinent family history. Social History  Substance Use Topics  . Smoking status: Current Every Day Smoker -- 1.00 packs/day    Types: Cigarettes  . Smokeless tobacco: None  . Alcohol Use: No   OB History    No data available     Review of Systems  Constitutional: Negative for fever, activity change and appetite change.  HENT: Negative for congestion and postnasal drip.   Eyes: Negative for visual disturbance.  Respiratory: Negative for cough, chest tightness and shortness of breath.   Cardiovascular: Negative for chest pain and leg swelling.  Gastrointestinal: Negative for nausea, vomiting and abdominal pain.  Genitourinary: Negative for dysuria, hematuria, vaginal bleeding and vaginal discharge.  Musculoskeletal: Positive for myalgias and arthralgias. Negative for back pain.  Skin: Negative for wound.  Neurological: Negative for dizziness, syncope, weakness and headaches.  A complete 10 system review of systems was  obtained and all systems are negative except as noted in the HPI and PMH.      Allergies  Neurontin and Septra ds  Home Medications   Prior to Admission medications   Medication Sig Start Date End Date Taking? Authorizing Provider  acetaminophen (TYLENOL) 325 MG tablet Take 975 mg by mouth every 8 (eight) hours as needed for mild pain.   Yes Historical Provider, MD  albuterol (PROVENTIL HFA;VENTOLIN HFA) 108 (90 BASE) MCG/ACT inhaler Inhale 2 puffs into the lungs every 6 (six) hours as needed for wheezing or shortness of breath.   Yes Historical Provider, MD  aspirin 325 MG EC tablet Take 325 mg by mouth 2 (two) times daily.   Yes Historical Provider, MD  B Complex-Folic Acid (B COMPLEX-VITAMIN B12 PO) Take 1 tablet by mouth daily.   Yes Historical Provider, MD  buPROPion (WELLBUTRIN XL) 300 MG 24 hr tablet Take 300 mg by mouth daily.   Yes Historical Provider, MD  calcium-vitamin D (OSCAL WITH D) 500-200 MG-UNIT tablet Take 1 tablet by mouth 2 (two) times daily.   Yes Historical Provider, MD  carbidopa-levodopa (SINEMET IR) 10-100 MG tablet Take 1 tablet by mouth 3 (three) times daily.   Yes Historical Provider, MD  divalproex (DEPAKOTE SPRINKLES) 125 MG capsule Take 500 mg by mouth 2 (two) times daily.   Yes Historical Provider, MD  docusate sodium (COLACE) 100 MG capsule Take 100 mg by mouth daily.   Yes Historical Provider, MD  haloperidol (HALDOL) 1 MG tablet Take 1 mg by mouth 2 (two) times daily.   Yes Historical  Provider, MD  hydrALAZINE (APRESOLINE) 25 MG tablet Take 25 mg by mouth 4 (four) times daily.   Yes Historical Provider, MD  lithium 300 MG tablet Take 300 mg by mouth 2 (two) times daily.   Yes Historical Provider, MD  LORazepam (ATIVAN) 0.5 MG tablet Take 0.25 mg by mouth every 12 (twelve) hours as needed for anxiety.   Yes Historical Provider, MD  Multiple Vitamins-Minerals (MULTIVITAMIN WITH MINERALS) tablet Take 1 tablet by mouth daily.   Yes Historical Provider, MD   PARoxetine (PAXIL) 10 MG tablet Take 10 mg by mouth daily.   Yes Historical Provider, MD  senna (SENOKOT) 8.6 MG TABS tablet Take 1 tablet by mouth daily as needed for mild constipation.   Yes Historical Provider, MD  traMADol (ULTRAM) 50 MG tablet Take 75 mg by mouth every 6 (six) hours as needed for moderate pain.   Yes Historical Provider, MD   BP 127/65 mmHg  Pulse 65  Temp(Src) 98.1 F (36.7 C) (Oral)  Resp 18  Ht 5\' 6"  (1.676 m)  Wt 152 lb (68.947 kg)  BMI 24.55 kg/m2  SpO2 98% Physical Exam  Constitutional: She is oriented to person, place, and time. She appears well-developed and well-nourished. No distress.  HENT:  Head: Normocephalic and atraumatic.  Mouth/Throat: Oropharynx is clear and moist. No oropharyngeal exudate.  Eyes: Conjunctivae and EOM are normal. Pupils are equal, round, and reactive to light.  Neck: Normal range of motion. Neck supple.  No meningismus.  Cardiovascular: Normal rate, regular rhythm, normal heart sounds and intact distal pulses.   No murmur heard. Pulmonary/Chest: Effort normal and breath sounds normal. No respiratory distress. She exhibits tenderness.  Chaperone present. Tenderness to left breast and anterior and lateral ribs. No ecchymosis. No crepitance. Equal breath sounds bilaterally  Abdominal: Soft. There is no tenderness. There is no rebound and no guarding.  Musculoskeletal: Normal range of motion. She exhibits no edema or tenderness.  Neurological: She is alert and oriented to person, place, and time. No cranial nerve deficit. She exhibits normal muscle tone. Coordination normal.  No ataxia on finger to nose bilaterally. No pronator drift. 5/5 strength throughout. CN 2-12 intact. Negative Romberg. Equal grip strength. Sensation intact. Gait is normal.   Skin: Skin is warm.  Psychiatric: She has a normal mood and affect. Her behavior is normal.  Nursing note and vitals reviewed.   ED Course  Procedures (including critical care  time) Labs Review Labs Reviewed - No data to display  Imaging Review Dg Chest 2 View  12/20/2014  CLINICAL DATA:  59 year old female with acute left-sided chest and rib pain following fall today. Initial encounter. EXAM: CHEST  2 VIEW COMPARISON:  None. FINDINGS: The cardiomediastinal silhouette is unremarkable. COPD identified. There is no evidence of focal airspace disease, pulmonary edema, suspicious pulmonary nodule/mass, pleural effusion, or pneumothorax. No acute bony abnormalities are identified. Lower cervical spinal surgical hardware noted. IMPRESSION: COPD without evidence of acute cardiopulmonary disease. Electronically Signed   By: Margarette Canada M.D.   On: 12/20/2014 20:29   I have personally reviewed and evaluated these images and lab results as part of my medical decision-making.   EKG Interpretation None      MDM   Final diagnoses:  Rib contusion, left, initial encounter   Mechanical fall with left rib and breast pain. No loss of consciousness. No head injury. No crepitance. Equal breath sounds.  X-ray in triage is negative for fracture or pneumothorax. Treated with pain medication.  Treat supportively  as contusion.  Rest, NSAIDs, ice, anti-inflammatories, follow-up with PCP. No hypoxia. Return precautions discussed.     Ezequiel Essex, MD 12/20/14 2118

## 2015-01-06 DIAGNOSIS — Z23 Encounter for immunization: Secondary | ICD-10-CM | POA: Diagnosis not present

## 2015-01-15 DIAGNOSIS — Z23 Encounter for immunization: Secondary | ICD-10-CM | POA: Diagnosis not present

## 2015-01-15 DIAGNOSIS — J449 Chronic obstructive pulmonary disease, unspecified: Secondary | ICD-10-CM | POA: Diagnosis not present

## 2015-01-15 DIAGNOSIS — F319 Bipolar disorder, unspecified: Secondary | ICD-10-CM | POA: Diagnosis not present

## 2015-01-15 DIAGNOSIS — F17200 Nicotine dependence, unspecified, uncomplicated: Secondary | ICD-10-CM | POA: Diagnosis not present

## 2015-03-26 DIAGNOSIS — F329 Major depressive disorder, single episode, unspecified: Secondary | ICD-10-CM | POA: Insufficient documentation

## 2015-04-10 ENCOUNTER — Other Ambulatory Visit (HOSPITAL_COMMUNITY): Payer: Self-pay | Admitting: Internal Medicine

## 2015-04-10 DIAGNOSIS — F17200 Nicotine dependence, unspecified, uncomplicated: Secondary | ICD-10-CM | POA: Diagnosis not present

## 2015-04-10 DIAGNOSIS — Z1231 Encounter for screening mammogram for malignant neoplasm of breast: Secondary | ICD-10-CM

## 2015-04-10 DIAGNOSIS — I10 Essential (primary) hypertension: Secondary | ICD-10-CM | POA: Diagnosis not present

## 2015-04-10 DIAGNOSIS — J449 Chronic obstructive pulmonary disease, unspecified: Secondary | ICD-10-CM | POA: Diagnosis not present

## 2015-04-10 DIAGNOSIS — F3164 Bipolar disorder, current episode mixed, severe, with psychotic features: Secondary | ICD-10-CM | POA: Diagnosis not present

## 2015-04-17 ENCOUNTER — Encounter: Payer: Self-pay | Admitting: *Deleted

## 2015-04-18 ENCOUNTER — Encounter: Payer: Self-pay | Admitting: Obstetrics & Gynecology

## 2015-04-18 ENCOUNTER — Ambulatory Visit (INDEPENDENT_AMBULATORY_CARE_PROVIDER_SITE_OTHER): Payer: Medicare Other | Admitting: Obstetrics & Gynecology

## 2015-04-18 ENCOUNTER — Other Ambulatory Visit (HOSPITAL_COMMUNITY)
Admission: RE | Admit: 2015-04-18 | Discharge: 2015-04-18 | Disposition: A | Discharge status: Home / Self Care | Payer: Medicare Other | Source: Ambulatory Visit | Attending: Obstetrics & Gynecology | Admitting: Obstetrics & Gynecology

## 2015-04-18 VITALS — BP 130/80 | HR 72 | Ht 65.0 in | Wt 160.0 lb

## 2015-04-18 DIAGNOSIS — Z01419 Encounter for gynecological examination (general) (routine) without abnormal findings: Secondary | ICD-10-CM | POA: Diagnosis not present

## 2015-04-18 DIAGNOSIS — Z1151 Encounter for screening for human papillomavirus (HPV): Secondary | ICD-10-CM | POA: Insufficient documentation

## 2015-04-18 DIAGNOSIS — Z124 Encounter for screening for malignant neoplasm of cervix: Secondary | ICD-10-CM

## 2015-04-19 ENCOUNTER — Ambulatory Visit (HOSPITAL_COMMUNITY)
Admission: RE | Admit: 2015-04-19 | Discharge: 2015-04-19 | Disposition: A | Discharge status: Home / Self Care | Payer: Medicare Other | Source: Ambulatory Visit | Attending: Internal Medicine | Admitting: Internal Medicine

## 2015-04-19 DIAGNOSIS — Z1231 Encounter for screening mammogram for malignant neoplasm of breast: Secondary | ICD-10-CM | POA: Insufficient documentation

## 2015-04-19 LAB — CYTOLOGY - PAP

## 2015-05-08 NOTE — Progress Notes (Signed)
Patient ID: Karen Keller, female   DOB: June 04, 1955, 60 y.o.   MRN: [id number] Subjective:     Karen Keller is a 60 y.o. female here for a routine exam.  No LMP recorded. Patient is postmenopausal. No obstetric history on file. Birth Control Method:  postmenopausal Menstrual Calendar(currently): amenorrhiec  Current complaints: none.   Current acute medical issues:     Recent Gynecologic History No LMP recorded. Patient is postmenopausal. Last Pap: 2014,  normal Last mammogram: ,    Past Medical History  Diagnosis Date  . Bipolar 1 disorder ([place])   . COPD (chronic obstructive pulmonary disease) ([place])   . Muscle weakness (generalized)   . Asthma   . Anxiety   . Manic episode, unspecified ([place])   . Pain   . Hypertension     Past Surgical History  Procedure Laterality Date  . Back surgery    . Appendectomy    . Closed fracture of shaft of right tibia      OB History    No data available      Social History   Social History  . Marital Status: Divorced    Spouse Name: N/A  . Number of Children: N/A  . Years of Education: N/A   Social History Main Topics  . Smoking status: Current Every Day Smoker -- 1.00 packs/day    Types: Cigarettes  . Smokeless tobacco: None  . Alcohol Use: No  . Drug Use: No  . Sexual Activity: Not Asked   Other Topics Concern  . None   Social History Narrative    Family History  Problem Relation Age of Onset  . Heart disease Father      Current outpatient prescriptions:  .  acetaminophen (TYLENOL) 325 MG tablet, Take 975 mg by mouth every 8 (eight) hours as needed for mild pain., Disp: , Rfl:  .  aspirin 325 MG EC tablet, Take 325 mg by mouth 2 (two) times daily., Disp: , Rfl:  .  buPROPion (WELLBUTRIN XL) 300 MG 24 hr tablet, Take 300 mg by mouth daily., Disp: , Rfl:  .  carbidopa-levodopa (SINEMET IR) 10-100 MG tablet, Take 1 tablet by mouth 3 (three) times daily., Disp: , Rfl:  .  divalproex (DEPAKOTE SPRINKLES) 125  MG capsule, Take 500 mg by mouth 2 (two) times daily., Disp: , Rfl:  .  docusate sodium (COLACE) 100 MG capsule, Take 100 mg by mouth daily., Disp: , Rfl:  .  haloperidol (HALDOL) 1 MG tablet, Take 1 mg by mouth 2 (two) times daily., Disp: , Rfl:  .  hydrALAZINE (APRESOLINE) 25 MG tablet, Take 25 mg by mouth 4 (four) times daily., Disp: , Rfl:  .  lithium 300 MG tablet, Take 300 mg by mouth 2 (two) times daily., Disp: , Rfl:  .  PARoxetine (PAXIL) 10 MG tablet, Take 10 mg by mouth daily., Disp: , Rfl:  .  polyethylene glycol (MIRALAX / GLYCOLAX) packet, Take 17 g by mouth daily., Disp: , Rfl:  .  traMADol (ULTRAM) 50 MG tablet, Take 75 mg by mouth every 6 (six) hours as needed for moderate pain., Disp: , Rfl:   Review of Systems  Review of Systems  Constitutional: Negative for fever, chills, weight loss, malaise/fatigue and diaphoresis.  HENT: Negative for hearing loss, ear pain, nosebleeds, congestion, sore throat, neck pain, tinnitus and ear discharge.   Eyes: Negative for blurred vision, double vision, photophobia, pain, discharge and redness.  Respiratory: Negative for cough, hemoptysis, sputum production, shortness  of breath, wheezing and stridor.   Cardiovascular: Negative for chest pain, palpitations, orthopnea, claudication, leg swelling and PND.  Gastrointestinal: negative for abdominal pain. Negative for heartburn, nausea, vomiting, diarrhea, constipation, blood in stool and melena.  Genitourinary: Negative for dysuria, urgency, frequency, hematuria and flank pain.  Musculoskeletal: Negative for myalgias, back pain, joint pain and falls.  Skin: Negative for itching and rash.  Neurological: Negative for dizziness, tingling, tremors, sensory change, speech change, focal weakness, seizures, loss of consciousness, weakness and headaches.  Endo/Heme/Allergies: Negative for environmental allergies and polydipsia. Does not bruise/bleed easily.  Psychiatric/Behavioral: Negative for  depression, suicidal ideas, hallucinations, memory loss and substance abuse. The patient is not nervous/anxious and does not have insomnia.        Objective:  Blood pressure 130/80, pulse 72, height 5\' 5"  (1.651 m), weight 160 lb (72.576 kg).   Physical Exam  Vitals reviewed. Constitutional: She is oriented to person, place, and time. She appears well-developed and well-nourished.  HENT:  Head: Normocephalic and atraumatic.        Right Ear: External ear normal.  Left Ear: External ear normal.  Nose: Nose normal.  Mouth/Throat: Oropharynx is clear and moist.  Eyes: Conjunctivae and EOM are normal. Pupils are equal, round, and reactive to light. Right eye exhibits no discharge. Left eye exhibits no discharge. No scleral icterus.  Neck: Normal range of motion. Neck supple. No tracheal deviation present. No thyromegaly present.  Cardiovascular: Normal rate, regular rhythm, normal heart sounds and intact distal pulses.  Exam reveals no gallop and no friction rub.   No murmur heard. Respiratory: Effort normal and breath sounds normal. No respiratory distress. She has no wheezes. She has no rales. She exhibits no tenderness.  GI: Soft. Bowel sounds are normal. She exhibits no distension and no mass. There is no tenderness. There is no rebound and no guarding.  Genitourinary:  Breasts no masses skin changes or nipple changes bilaterally      Vulva is normal without lesions Vagina is pink moist without discharge Cervix normal in appearance and pap is done Uterus is normal size shape and contour Adnexa is negative with normal sized ovaries   Musculoskeletal: Normal range of motion. She exhibits no edema and no tenderness.  Neurological: She is alert and oriented to person, place, and time. She has normal reflexes. She displays normal reflexes. No cranial nerve deficit. She exhibits normal muscle tone. Coordination normal.  Skin: Skin is warm and dry. No rash noted. No erythema. No pallor.   Psychiatric: She has a normal mood and affect. Her behavior is normal. Judgment and thought content normal.       Assessment:    Healthy female exam.    Plan:    Mammogram ordered. Follow up in: 3 years.    No orders of the defined types were placed in this encounter.    No orders of the defined types were placed in this encounter.

## 2015-07-10 DIAGNOSIS — N3941 Urge incontinence: Secondary | ICD-10-CM | POA: Diagnosis not present

## 2015-07-10 DIAGNOSIS — F17200 Nicotine dependence, unspecified, uncomplicated: Secondary | ICD-10-CM | POA: Diagnosis not present

## 2015-07-10 DIAGNOSIS — N390 Urinary tract infection, site not specified: Secondary | ICD-10-CM | POA: Diagnosis not present

## 2015-07-10 DIAGNOSIS — J449 Chronic obstructive pulmonary disease, unspecified: Secondary | ICD-10-CM | POA: Diagnosis not present

## 2015-07-10 DIAGNOSIS — F319 Bipolar disorder, unspecified: Secondary | ICD-10-CM | POA: Diagnosis not present

## 2015-10-10 ENCOUNTER — Encounter (HOSPITAL_COMMUNITY): Payer: Self-pay

## 2015-10-10 ENCOUNTER — Emergency Department (HOSPITAL_COMMUNITY)
Admission: EM | Admit: 2015-10-10 | Discharge: 2015-10-10 | Disposition: A | Discharge status: Home / Self Care | Payer: Medicare Other | Attending: Emergency Medicine | Admitting: Emergency Medicine

## 2015-10-10 ENCOUNTER — Emergency Department (HOSPITAL_COMMUNITY): Payer: Medicare Other

## 2015-10-10 DIAGNOSIS — Z7982 Long term (current) use of aspirin: Secondary | ICD-10-CM | POA: Diagnosis not present

## 2015-10-10 DIAGNOSIS — R109 Unspecified abdominal pain: Secondary | ICD-10-CM | POA: Diagnosis not present

## 2015-10-10 DIAGNOSIS — Z79899 Other long term (current) drug therapy: Secondary | ICD-10-CM | POA: Diagnosis not present

## 2015-10-10 DIAGNOSIS — R091 Pleurisy: Secondary | ICD-10-CM | POA: Diagnosis not present

## 2015-10-10 DIAGNOSIS — I10 Essential (primary) hypertension: Secondary | ICD-10-CM | POA: Insufficient documentation

## 2015-10-10 DIAGNOSIS — A156 Tuberculous pleurisy: Secondary | ICD-10-CM | POA: Diagnosis not present

## 2015-10-10 DIAGNOSIS — R079 Chest pain, unspecified: Secondary | ICD-10-CM | POA: Diagnosis not present

## 2015-10-10 DIAGNOSIS — J449 Chronic obstructive pulmonary disease, unspecified: Secondary | ICD-10-CM | POA: Insufficient documentation

## 2015-10-10 DIAGNOSIS — R0789 Other chest pain: Secondary | ICD-10-CM | POA: Diagnosis not present

## 2015-10-10 LAB — BASIC METABOLIC PANEL
Anion gap: 3 — ABNORMAL LOW (ref 5–15)
BUN: 16 mg/dL (ref 6–20)
CO2: 28 mmol/L (ref 22–32)
Calcium: 10.1 mg/dL (ref 8.9–10.3)
Chloride: 107 mmol/L (ref 101–111)
Creatinine, Ser: 0.84 mg/dL (ref 0.44–1.00)
GFR calc Af Amer: >60 mL/min (ref >60)
GFR calc non Af Amer: >60 mL/min (ref >60)
Glucose, Bld: 88 mg/dL (ref 65–99)
Potassium: 4.2 mmol/L (ref 3.5–5.1)
Sodium: 138 mmol/L (ref 135–145)

## 2015-10-10 LAB — CBC
HCT: 34.9 % — ABNORMAL LOW (ref 36.0–46.0)
HEMOGLOBIN: 11.8 g/dL — AB (ref 12.0–15.0)
MCH: 36 pg — AB (ref 26.0–34.0)
MCHC: 33.8 g/dL (ref 30.0–36.0)
MCV: 106.4 fL — ABNORMAL HIGH (ref 78.0–100.0)
PLATELETS: 211 10*3/uL (ref 150–400)
RBC: 3.28 MIL/uL — ABNORMAL LOW (ref 3.87–5.11)
RDW: 13.7 % (ref 11.5–15.5)
WBC: 8 10*3/uL (ref 4.0–10.5)

## 2015-10-10 LAB — I-STAT TROPONIN, ED: TROPONIN I, POC: 0.01 ng/mL (ref 0.00–0.08)

## 2015-10-10 LAB — D-DIMER, QUANTITATIVE: D-Dimer, Quant: 0.32 ug/mL-FEU (ref 0.00–0.50)

## 2015-10-10 MED ORDER — HYDROMORPHONE HCL 1 MG/ML IJ SOLN
1.0000 mg | Freq: Once | INTRAMUSCULAR | Status: AC
  Administered 2015-10-10: 1 mg via INTRAVENOUS
  Filled 2015-10-10: qty 1

## 2015-10-10 MED ORDER — HYDROCODONE-ACETAMINOPHEN 5-325 MG PO TABS: 1.0000 | ORAL_TABLET | Freq: Four times a day (QID) | ORAL | 0 refills | Status: DC | PRN

## 2015-10-10 NOTE — ED Notes (Signed)
Patient transported to X-ray 

## 2015-10-10 NOTE — ED Triage Notes (Signed)
Pt reports onset of mid sternal chest pain last night, states it is sharp pain worse with movement and deep breath.  Pt was given 1 nitro and 4 baby aspirin by ems without change in pain level.  Pt reports pain 9/10

## 2015-10-10 NOTE — ED Notes (Signed)
   10/10/15 1913  Chest Pain Assessment  Occurrence Last night  Chronicity New  Chest Pain Location Central chest  Pain Descriptors / Indicators Stabbing  Associated Symptoms Other (Comment) (increase in pain when takes a deep breath. )  Risk Factors Smoking history  pt states chest pain that started last night gotten worse over the day. Increase in pain when she takes a deep breath.

## 2015-10-10 NOTE — Discharge Instructions (Signed)
Follow up with your md next week for recheck °

## 2015-10-10 NOTE — ED Notes (Signed)
Pt alert & oriented x4, stable gait. Patient  given discharge instructions, paperwork & prescription(s).Patient informed not to drive, operate any equipment & handel any important documents 4 hours after taking pain medication. Patient  verbalized understanding. Pt left department w/ no further questions. 

## 2015-10-10 NOTE — ED Provider Notes (Signed)
[facility] DEPT Provider Note   CSN: [id number] Arrival date & time: 10/10/15  1903  First Provider Contact:  First MD Initiated Contact with Patient 10/10/15 1930        History   Chief Complaint Chief Complaint  Patient presents with  . Chest Pain    HPI Karen Keller is a 60 y.o. female.  Patient states she's been having some chest pains off and on. The pain is worse with inspiration   The history is provided by the patient. No language interpreter was used.  Chest Pain   This is a new problem. The current episode started more than 2 days ago. The problem occurs daily. The problem has not changed since onset.The pain is associated with breathing. The pain is present in the substernal region. The pain is at a severity of 4/10. The pain is mild. The quality of the pain is described as brief. The pain does not radiate. The symptoms are aggravated by deep breathing. Associated symptoms include abdominal pain. Pertinent negatives include no back pain, no cough and no headaches.  Pertinent negatives for past medical history include no seizures.    Past Medical History:  Diagnosis Date  . Anxiety   . Asthma   . Bipolar 1 disorder ([place])   . COPD (chronic obstructive pulmonary disease) ([place])   . Hypertension   . Manic episode, unspecified ([place])   . Muscle weakness (generalized)   . Pain     There are no active problems to display for this patient.   Past Surgical History:  Procedure Laterality Date  . APPENDECTOMY    . BACK SURGERY    . closed fracture of shaft of right tibia      OB History    No data available       Home Medications    Prior to Admission medications   Medication Sig Start Date End Date Taking? Authorizing Provider  acetaminophen (TYLENOL) 325 MG tablet Take 975 mg by mouth every 8 (eight) hours.    Yes Historical Provider, MD  aspirin 325 MG EC tablet Take 325 mg by mouth 2 (two) times daily.   Yes Historical Provider, MD  buPROPion  (WELLBUTRIN XL) 300 MG 24 hr tablet Take 300 mg by mouth daily.   Yes Historical Provider, MD  carbidopa-levodopa (SINEMET IR) 10-100 MG tablet Take 1 tablet by mouth 3 (three) times daily.   Yes Historical Provider, MD  divalproex (DEPAKOTE SPRINKLES) 125 MG capsule Take 500 mg by mouth 2 (two) times daily.   Yes Historical Provider, MD  docusate sodium (COLACE) 100 MG capsule Take 100 mg by mouth daily.   Yes Historical Provider, MD  haloperidol (HALDOL) 1 MG tablet Take 1 mg by mouth 2 (two) times daily.   Yes Historical Provider, MD  hydrALAZINE (APRESOLINE) 25 MG tablet Take 25 mg by mouth 4 (four) times daily.   Yes Historical Provider, MD  lithium 300 MG tablet Take 300 mg by mouth 2 (two) times daily.   Yes Historical Provider, MD  PARoxetine (PAXIL) 10 MG tablet Take 10 mg by mouth daily.   Yes Historical Provider, MD  polyethylene glycol (MIRALAX / GLYCOLAX) packet Take 17 g by mouth daily.   Yes Historical Provider, MD  traMADol (ULTRAM) 50 MG tablet Take 50 mg by mouth 2 (two) times daily.    Yes Historical Provider, MD  HYDROcodone-acetaminophen (NORCO/VICODIN) 5-325 MG tablet Take 1 tablet by mouth every 6 (six) hours as needed. 10/10/15   Karen Keller  Roderic Palau, MD    Family History Family History  Problem Relation Age of Onset  . Heart disease Father     Social History Social History  Substance Use Topics  . Smoking status: Current Every Day Smoker    Packs/day: 1.00    Types: Cigarettes  . Smokeless tobacco: Never Used  . Alcohol use No     Allergies   Neurontin [gabapentin] and Septra ds [sulfamethoxazole-trimethoprim]   Review of Systems Review of Systems  Constitutional: Negative for appetite change and fatigue.  HENT: Negative for congestion, ear discharge and sinus pressure.   Eyes: Negative for discharge.  Respiratory: Negative for cough.   Cardiovascular: Positive for chest pain.  Gastrointestinal: Positive for abdominal pain. Negative for diarrhea.    Genitourinary: Negative for frequency and hematuria.  Musculoskeletal: Negative for back pain.  Skin: Negative for rash.  Neurological: Negative for seizures and headaches.  Psychiatric/Behavioral: Negative for hallucinations.     Physical Exam Updated Vital Signs BP 135/79   Pulse 60   Temp 98.1 F (36.7 C) (Oral)   Resp 21   SpO2 94%   Physical Exam  Constitutional: She is oriented to person, place, and time. She appears well-developed.  HENT:  Head: Normocephalic.  Eyes: Conjunctivae and EOM are normal. No scleral icterus.  Neck: Neck supple. No thyromegaly present.  Cardiovascular: Normal rate and regular rhythm.  Exam reveals no gallop and no friction rub.   No murmur heard. Pulmonary/Chest: No stridor. She has no wheezes. She has no rales. She exhibits no tenderness.  Abdominal: She exhibits no distension. There is no tenderness. There is no rebound.  Musculoskeletal: Normal range of motion. She exhibits no edema.  Lymphadenopathy:    She has no cervical adenopathy.  Neurological: She is oriented to person, place, and time. She exhibits normal muscle tone. Coordination normal.  Skin: No rash noted. No erythema.  Psychiatric: She has a normal mood and affect. Her behavior is normal.     ED Treatments / Results  Labs (all labs ordered are listed, but only abnormal results are displayed) Labs Reviewed  BASIC METABOLIC PANEL - Abnormal; Notable for the following:       Result Value   Anion gap 3 (*)    All other components within normal limits  CBC - Abnormal; Notable for the following:    RBC 3.28 (*)    Hemoglobin 11.8 (*)    HCT 34.9 (*)    MCV 106.4 (*)    MCH 36.0 (*)    All other components within normal limits  D-DIMER, QUANTITATIVE (NOT AT [facility])  I-STAT TROPOININ, ED    EKG  EKG Interpretation  Date/Time:  Thursday October 10 2015 19:05:51 EDT Ventricular Rate:  58 PR Interval:    QRS Duration: 107 QT Interval:  502 QTC Calculation: 494 R  Axis:   34 Text Interpretation:  Sinus rhythm Nonspecific T abnormalities, anterior leads Borderline prolonged QT interval Confirmed by Edyn Popoca  MD, Tanda Morrissey [phone]) on 10/10/2015 7:30:43 PM       Radiology Dg Chest 2 View  Result Date: 10/10/2015 CLINICAL DATA:  Initial evaluation for acute midsternal chest pain. EXAM: CHEST  2 VIEW COMPARISON:  Prior radiograph from 12/20/2014. FINDINGS: Mild cardiomegaly is stable from prior. Mediastinal silhouette within normal limits. Lungs are normally inflated. Changes related COPD noted. Scattered left basilar atelectasis/ scarring present, similar to prior. No other focal airspace disease. No pulmonary edema or pleural effusion. No pneumothorax. No acute osseous abnormality.  Cervical ACDF  noted. IMPRESSION: 1. No active cardiopulmonary disease. 2. Mild left basilar atelectasis/scarring, similar to prior. 3. COPD. Electronically Signed   By: Jeannine Boga M.D.   On: 10/10/2015 19:33    Procedures Procedures (including critical care time)  Medications Ordered in ED Medications  HYDROmorphone (DILAUDID) injection 1 mg (1 mg Intravenous Given 10/10/15 2038)     Initial Impression / Assessment and Plan / ED Course  I have reviewed the triage vital signs and the nursing notes.  Pertinent labs & imaging results that were available during my care of the patient were reviewed by me and considered in my medical decision making (see chart for details).  Clinical Course    Labs EKG chest x-ray unremarkable. Suspect pleuritis patient given medicine for pain and will follow-up with PCP  Final Clinical Impressions(s) / ED Diagnoses   Final diagnoses:  Pleuritis    New Prescriptions New Prescriptions   HYDROCODONE-ACETAMINOPHEN (NORCO/VICODIN) 5-325 MG TABLET    Take 1 tablet by mouth every 6 (six) hours as needed.  Milton Ferguson, MD 10/10/15 2251

## 2015-10-16 DIAGNOSIS — R0789 Other chest pain: Secondary | ICD-10-CM | POA: Diagnosis not present

## 2015-10-16 DIAGNOSIS — F319 Bipolar disorder, unspecified: Secondary | ICD-10-CM | POA: Diagnosis not present

## 2015-10-16 DIAGNOSIS — J449 Chronic obstructive pulmonary disease, unspecified: Secondary | ICD-10-CM | POA: Diagnosis not present

## 2015-10-16 DIAGNOSIS — F17200 Nicotine dependence, unspecified, uncomplicated: Secondary | ICD-10-CM | POA: Diagnosis not present

## 2015-10-25 ENCOUNTER — Emergency Department (HOSPITAL_COMMUNITY)
Admission: EM | Admit: 2015-10-25 | Discharge: 2015-10-25 | Disposition: A | Discharge status: Home / Self Care | Payer: Medicare Other | Attending: Emergency Medicine | Admitting: Emergency Medicine

## 2015-10-25 ENCOUNTER — Emergency Department (HOSPITAL_COMMUNITY): Payer: Medicare Other

## 2015-10-25 ENCOUNTER — Encounter (HOSPITAL_COMMUNITY): Payer: Self-pay

## 2015-10-25 DIAGNOSIS — Y92002 Bathroom of unspecified non-institutional (private) residence single-family (private) house as the place of occurrence of the external cause: Secondary | ICD-10-CM | POA: Insufficient documentation

## 2015-10-25 DIAGNOSIS — R52 Pain, unspecified: Secondary | ICD-10-CM | POA: Diagnosis not present

## 2015-10-25 DIAGNOSIS — S40022A Contusion of left upper arm, initial encounter: Secondary | ICD-10-CM

## 2015-10-25 DIAGNOSIS — J45909 Unspecified asthma, uncomplicated: Secondary | ICD-10-CM | POA: Insufficient documentation

## 2015-10-25 DIAGNOSIS — S5012XA Contusion of left forearm, initial encounter: Secondary | ICD-10-CM | POA: Diagnosis not present

## 2015-10-25 DIAGNOSIS — Z79899 Other long term (current) drug therapy: Secondary | ICD-10-CM | POA: Diagnosis not present

## 2015-10-25 DIAGNOSIS — I10 Essential (primary) hypertension: Secondary | ICD-10-CM | POA: Insufficient documentation

## 2015-10-25 DIAGNOSIS — Y999 Unspecified external cause status: Secondary | ICD-10-CM | POA: Insufficient documentation

## 2015-10-25 DIAGNOSIS — W19XXXA Unspecified fall, initial encounter: Secondary | ICD-10-CM | POA: Diagnosis not present

## 2015-10-25 DIAGNOSIS — M79602 Pain in left arm: Secondary | ICD-10-CM | POA: Diagnosis not present

## 2015-10-25 DIAGNOSIS — Y939 Activity, unspecified: Secondary | ICD-10-CM | POA: Diagnosis not present

## 2015-10-25 DIAGNOSIS — Z7982 Long term (current) use of aspirin: Secondary | ICD-10-CM | POA: Insufficient documentation

## 2015-10-25 DIAGNOSIS — S59912A Unspecified injury of left forearm, initial encounter: Secondary | ICD-10-CM | POA: Diagnosis not present

## 2015-10-25 DIAGNOSIS — F17210 Nicotine dependence, cigarettes, uncomplicated: Secondary | ICD-10-CM | POA: Diagnosis not present

## 2015-10-25 DIAGNOSIS — M7989 Other specified soft tissue disorders: Secondary | ICD-10-CM | POA: Diagnosis not present

## 2015-10-25 DIAGNOSIS — J449 Chronic obstructive pulmonary disease, unspecified: Secondary | ICD-10-CM | POA: Insufficient documentation

## 2015-10-25 DIAGNOSIS — S4992XA Unspecified injury of left shoulder and upper arm, initial encounter: Secondary | ICD-10-CM | POA: Diagnosis present

## 2015-10-25 DIAGNOSIS — S6992XA Unspecified injury of left wrist, hand and finger(s), initial encounter: Secondary | ICD-10-CM | POA: Diagnosis not present

## 2015-10-25 MED ORDER — FENTANYL CITRATE (PF) 100 MCG/2ML IJ SOLN
50.0000 ug | INTRAMUSCULAR | Status: DC | PRN
  Administered 2015-10-25: 50 ug via INTRAVENOUS
  Filled 2015-10-25: qty 2

## 2015-10-25 NOTE — ED Provider Notes (Signed)
[facility] DEPT Provider Note   CSN: [id number] Arrival date & time: 10/25/15  1543     History   Chief Complaint Chief Complaint  Patient presents with  . Arm Pain    HPI Karen Keller is a 60 y.o. female.  60 yo female with bipolar, htn presents after mechanical fall with left arm injury and deformity.  No head injury or loc.  Pain with rom.     Arm Pain  Pertinent negatives include no chest pain, no abdominal pain, no headaches and no shortness of breath.    Past Medical History:  Diagnosis Date  . Anxiety   . Asthma   . Bipolar 1 disorder ([place])   . COPD (chronic obstructive pulmonary disease) ([place])   . Hypertension   . Manic episode, unspecified ([place])   . Muscle weakness (generalized)   . Pain     There are no active problems to display for this patient.   Past Surgical History:  Procedure Laterality Date  . APPENDECTOMY    . BACK SURGERY    . closed fracture of shaft of right tibia      OB History    No data available       Home Medications    Prior to Admission medications   Medication Sig Start Date End Date Taking? Authorizing Provider  acetaminophen (TYLENOL) 325 MG tablet Take 975 mg by mouth every 8 (eight) hours.    Yes Historical Provider, MD  albuterol (PROAIR HFA) 108 (90 Base) MCG/ACT inhaler Inhale 2 puffs into the lungs every 4 (four) hours as needed for wheezing or shortness of breath.   Yes Historical Provider, MD  aspirin 325 MG EC tablet Take 325 mg by mouth 2 (two) times daily.   Yes Historical Provider, MD  B Complex-Biotin-FA (B-COMPLEX PO) Take 1 tablet by mouth daily.   Yes Historical Provider, MD  buPROPion (WELLBUTRIN XL) 300 MG 24 hr tablet Take 300 mg by mouth daily.   Yes Historical Provider, MD  calcium-vitamin D (OSCAL WITH D) 500-200 MG-UNIT tablet Take 1 tablet by mouth 2 (two) times daily with a meal.   Yes Historical Provider, MD  carbidopa-levodopa (SINEMET IR) 10-100 MG tablet Take 1 tablet by mouth 3  (three) times daily.   Yes Historical Provider, MD  divalproex (DEPAKOTE SPRINKLES) 125 MG capsule Take 500 mg by mouth 2 (two) times daily.   Yes Historical Provider, MD  docusate sodium (COLACE) 100 MG capsule Take 100 mg by mouth daily.   Yes Historical Provider, MD  haloperidol (HALDOL) 1 MG tablet Take 1 mg by mouth 2 (two) times daily.   Yes Historical Provider, MD  hydrALAZINE (APRESOLINE) 25 MG tablet Take 25 mg by mouth 4 (four) times daily.   Yes Historical Provider, MD  lithium 300 MG tablet Take 300 mg by mouth 2 (two) times daily.   Yes Historical Provider, MD  LORazepam (ATIVAN) 0.5 MG tablet Take 0.5 mg by mouth every 12 (twelve) hours as needed for anxiety.   Yes Historical Provider, MD  Multiple Vitamin (MULTIVITAMIN WITH MINERALS) TABS tablet Take 1 tablet by mouth daily.   Yes Historical Provider, MD  naproxen (NAPROSYN) 500 MG tablet Take 500 mg by mouth 2 (two) times daily as needed for mild pain ((chest pain)).   Yes Historical Provider, MD  oxybutynin (DITROPAN-XL) 10 MG 24 hr tablet Take 10 mg by mouth daily.   Yes Historical Provider, MD  PARoxetine (PAXIL) 10 MG tablet Take 10 mg by  mouth daily. Takes 10mg  and 40mg  for a total of 40mg  daily   Yes Historical Provider, MD  PARoxetine (PAXIL) 40 MG tablet Take 40 mg by mouth daily. Takes 10mg  and 40mg  for a total of 40mg  daily   Yes Historical Provider, MD  polyethylene glycol (MIRALAX / GLYCOLAX) packet Take 17 g by mouth daily.   Yes Historical Provider, MD  senna-docusate (SENNA-PLUS) 8.6-50 MG tablet Take 1 tablet by mouth daily as needed for mild constipation.   Yes Historical Provider, MD  traMADol (ULTRAM) 50 MG tablet Take 50 mg by mouth 2 (two) times daily.    Yes Historical Provider, MD    Family History Family History  Problem Relation Age of Onset  . Heart disease Father     Social History Social History  Substance Use Topics  . Smoking status: Current Every Day Smoker    Packs/day: 1.00    Types:  Cigarettes  . Smokeless tobacco: Never Used  . Alcohol use No     Allergies   Neurontin [gabapentin] and Septra ds [sulfamethoxazole-trimethoprim]   Review of Systems Review of Systems  Constitutional: Negative for chills and fever.  HENT: Negative for congestion.   Eyes: Negative for visual disturbance.  Respiratory: Negative for shortness of breath.   Cardiovascular: Negative for chest pain.  Gastrointestinal: Negative for abdominal pain and vomiting.  Genitourinary: Negative for dysuria and flank pain.  Musculoskeletal: Negative for back pain, neck pain and neck stiffness.  Skin: Positive for wound. Negative for rash.  Neurological: Negative for light-headedness and headaches.     Physical Exam Updated Vital Signs BP (!) 111/50   Pulse 70   Temp 98.8 F (37.1 C) (Oral)   Resp 16   Ht 5\' 9"  (1.753 m)   Wt 158 lb (71.7 kg)   SpO2 92%   BMI 23.33 kg/m   Physical Exam  Constitutional: She is oriented to person, place, and time. She appears well-developed and well-nourished.  HENT:  Head: Normocephalic and atraumatic.  Eyes: Conjunctivae are normal. Right eye exhibits no discharge. Left eye exhibits no discharge.  Neck: Normal range of motion. Neck supple. No tracheal deviation present.  Cardiovascular: Normal rate and regular rhythm.   Pulmonary/Chest: Effort normal and breath sounds normal.  Abdominal: Soft. She exhibits no distension. There is no tenderness. There is no guarding.  Musculoskeletal: She exhibits edema and tenderness.  Pt has tenderness, swelling and deformity to left distal/ mid ulna, nv intact distally.  No elbow or shoulder tenderness.   Neurological: She is alert and oriented to person, place, and time.  Skin: Skin is warm. No rash noted.  Psychiatric: She has a normal mood and affect.  Nursing note and vitals reviewed.    ED Treatments / Results  Labs (all labs ordered are listed, but only abnormal results are displayed) Labs Reviewed -  No data to display  EKG  EKG Interpretation None       Radiology Dg Forearm Left  Result Date: 10/25/2015 CLINICAL DATA:  Fall. EXAM: LEFT FOREARM - 2 VIEW COMPARISON:  None. FINDINGS: There is soft tissue swelling throughout the distal volar ulnar aspect of the left forearm. No fracture, suspicious focal osseous lesion or radiopaque foreign body. No evidence of malalignment at the left wrist or left elbow on these views. IMPRESSION: Distal left forearm soft tissue swelling, with no fracture. Electronically Signed   By: Ilona Sorrel M.D.   On: 10/25/2015 16:59   Dg Wrist Complete Left  Result Date: 10/25/2015  CLINICAL DATA:  Fall. EXAM: LEFT WRIST - COMPLETE 3+ VIEW COMPARISON:  None. FINDINGS: There is soft tissue swelling throughout the volar left wrist. No fracture, dislocation or suspicious focal osseous lesion. 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 No radiopaque foreign body. IMPRESSION: Volar left wrist soft tissue swelling, with no fracture or malalignment. Electronically Signed   By: Ilona Sorrel M.D.   On: 10/25/2015 17:00    Procedures Procedures (including critical care time)  Medications Ordered in ED Medications  fentaNYL (SUBLIMAZE) injection 50 mcg (50 mcg Intravenous Given 10/25/15 1621)     Initial Impression / Assessment and Plan / ED Course  I have reviewed the triage vital signs and the nursing notes.  Pertinent labs & imaging results that were available during my care of the patient were reviewed by me and considered in my medical decision making (see chart for details).  Clinical Course   Fall, concern for forearm fracture.   Pain meds given.   No fx seen.  Wrist splint and outpt fup.     Final Clinical Impressions(s) / ED Diagnoses   Final diagnoses:  Arm contusion, left, initial encounter    New Prescriptions New Prescriptions   No medications on file     Elnora Morrison, MD 10/25/15 1737

## 2015-10-25 NOTE — ED Notes (Signed)
Patient given discharge instruction, verbalized understand. IV removed, band aid applied. Patient wheelchair out of the department with caregiver

## 2015-10-25 NOTE — Discharge Instructions (Signed)
Ice, motrin and tylenol for pain. Elevate as needed. Wear wrist splint or ace wrap for comfort.

## 2015-10-25 NOTE — ED Triage Notes (Signed)
Pt states she was attempting to go to the bathroom early this morning and fell. Complain of pain and swelling in left arm.

## 2015-11-08 DIAGNOSIS — J449 Chronic obstructive pulmonary disease, unspecified: Secondary | ICD-10-CM | POA: Diagnosis not present

## 2015-11-08 DIAGNOSIS — F319 Bipolar disorder, unspecified: Secondary | ICD-10-CM | POA: Diagnosis not present

## 2015-11-08 DIAGNOSIS — T148 Other injury of unspecified body region: Secondary | ICD-10-CM | POA: Diagnosis not present

## 2015-11-22 ENCOUNTER — Telehealth: Payer: Self-pay

## 2015-11-22 DIAGNOSIS — J441 Chronic obstructive pulmonary disease with (acute) exacerbation: Secondary | ICD-10-CM | POA: Diagnosis not present

## 2015-11-22 NOTE — Telephone Encounter (Signed)
Pt received letter (February) to be triaged and needs to schedule her colonoscopy. Please call [phone]

## 2015-11-28 NOTE — Telephone Encounter (Signed)
Pt is at one of the [facility] is faxing his current med list to me.

## 2015-12-06 NOTE — Telephone Encounter (Signed)
Letter mailed for pt to have [facility] send me the med list to triage for the colonoscopy.

## 2015-12-11 NOTE — Telephone Encounter (Signed)
Received the med list from [facility] and pt needs appt due to meds.  Appt with Neil Crouch, PA on 01/01/2016 at 2:00 PM.

## 2015-12-12 DIAGNOSIS — F319 Bipolar disorder, unspecified: Secondary | ICD-10-CM | POA: Diagnosis not present

## 2016-01-01 ENCOUNTER — Encounter: Payer: Self-pay | Admitting: Gastroenterology

## 2016-01-01 ENCOUNTER — Ambulatory Visit (INDEPENDENT_AMBULATORY_CARE_PROVIDER_SITE_OTHER): Payer: Medicare Other | Admitting: Gastroenterology

## 2016-01-01 ENCOUNTER — Other Ambulatory Visit (HOSPITAL_COMMUNITY)
Admission: RE | Admit: 2016-01-01 | Discharge: 2016-01-01 | Disposition: A | Discharge status: Home / Self Care | Payer: Medicare Other | Source: Ambulatory Visit | Attending: Gastroenterology | Admitting: Gastroenterology

## 2016-01-01 ENCOUNTER — Ambulatory Visit (HOSPITAL_COMMUNITY)
Admission: RE | Admit: 2016-01-01 | Discharge: 2016-01-01 | Disposition: A | Discharge status: Home / Self Care | Payer: Medicare Other | Source: Ambulatory Visit | Attending: Gastroenterology | Admitting: Gastroenterology

## 2016-01-01 DIAGNOSIS — Z1211 Encounter for screening for malignant neoplasm of colon: Secondary | ICD-10-CM

## 2016-01-01 DIAGNOSIS — D539 Nutritional anemia, unspecified: Secondary | ICD-10-CM | POA: Diagnosis not present

## 2016-01-01 DIAGNOSIS — R600 Localized edema: Secondary | ICD-10-CM | POA: Insufficient documentation

## 2016-01-01 LAB — CBC WITH DIFFERENTIAL/PLATELET
Basophils Absolute: 0.1 10*3/uL (ref 0.0–0.1)
Basophils Relative: 1 %
EOS ABS: 0.4 10*3/uL (ref 0.0–0.7)
Eosinophils Relative: 5 %
HEMATOCRIT: 37.5 % (ref 36.0–46.0)
HEMOGLOBIN: 12.2 g/dL (ref 12.0–15.0)
LYMPHS ABS: 2.5 10*3/uL (ref 0.7–4.0)
LYMPHS PCT: 31 %
MCH: 35 pg — AB (ref 26.0–34.0)
MCHC: 32.5 g/dL (ref 30.0–36.0)
MCV: 107.4 fL — AB (ref 78.0–100.0)
MONOS PCT: 12 %
Monocytes Absolute: 1 10*3/uL (ref 0.1–1.0)
NEUTROS ABS: 4.2 10*3/uL (ref 1.7–7.7)
NEUTROS PCT: 51 %
Platelets: 264 10*3/uL (ref 150–400)
RBC: 3.49 MIL/uL — AB (ref 3.87–5.11)
RDW: 14.5 % (ref 11.5–15.5)
WBC: 8 10*3/uL (ref 4.0–10.5)

## 2016-01-01 LAB — FOLATE: Folate: 33.2 ng/mL (ref >5.9)

## 2016-01-01 LAB — VITAMIN B12: Vitamin B-12: 1325 pg/mL — ABNORMAL HIGH (ref 180–914)

## 2016-01-01 NOTE — Progress Notes (Signed)
Primary Care Physician:  Rosita Fire, MD  Primary Gastroenterologist:  Garfield Cornea, MD   Chief Complaint  Patient presents with  . Colonoscopy    never had one, no problems    HPI:  Karen Keller is a 60 y.o. female here to schedule first ever screening colonoscopy. Patient resides at [facility]. She denies constipation, diarrhea, melena, rectal bleeding, abdominal pain, heartburn, dysphagia, vomiting, weight loss. She has noted some lower extremity edema, left greater than right. She takes MiraLAX daily which keeps her bowels regular.   Current Outpatient Prescriptions  Medication Sig Dispense Refill  . acetaminophen (TYLENOL) 325 MG tablet Take 975 mg by mouth every 8 (eight) hours.     Marland Kitchen albuterol (PROAIR HFA) 108 (90 Base) MCG/ACT inhaler Inhale 2 puffs into the lungs every 4 (four) hours as needed for wheezing or shortness of breath.    Marland Kitchen aspirin 325 MG EC tablet Take 325 mg by mouth 2 (two) times daily.    . B Complex-Biotin-FA (B-COMPLEX PO) Take 1 tablet by mouth daily.    Marland Kitchen buPROPion (WELLBUTRIN XL) 300 MG 24 hr tablet Take 300 mg by mouth daily.    . calcium-vitamin D (OSCAL WITH D) 500-200 MG-UNIT tablet Take 1 tablet by mouth 2 (two) times daily with a meal.    . carbidopa-levodopa (SINEMET IR) 10-100 MG tablet Take 1 tablet by mouth 3 (three) times daily.    . divalproex (DEPAKOTE SPRINKLES) 125 MG capsule Take 500 mg by mouth 2 (two) times daily.    Marland Kitchen docusate sodium (COLACE) 100 MG capsule Take 100 mg by mouth daily.    . haloperidol (HALDOL) 1 MG tablet Take 1 mg by mouth 2 (two) times daily.    . hydrALAZINE (APRESOLINE) 25 MG tablet Take 25 mg by mouth 4 (four) times daily.    Marland Kitchen lithium 300 MG tablet Take 300 mg by mouth 2 (two) times daily.    Marland Kitchen LORazepam (ATIVAN) 0.5 MG tablet Take 0.5 mg by mouth every 12 (twelve) hours as needed for anxiety.    . Multiple Vitamin (MULTIVITAMIN WITH MINERALS) TABS tablet Take 1 tablet by mouth daily.    . naproxen  (NAPROSYN) 500 MG tablet Take 500 mg by mouth 2 (two) times daily as needed for mild pain ((chest pain)).    Marland Kitchen oxybutynin (DITROPAN-XL) 10 MG 24 hr tablet Take 10 mg by mouth daily.    Marland Kitchen PARoxetine (PAXIL) 10 MG tablet Take 10 mg by mouth daily. Takes 10 mg and 40 mg= 50 mg    . PARoxetine (PAXIL) 40 MG tablet Take 40 mg by mouth daily. Takes 10mg  and 40mg  for a total of 40mg  daily    . polyethylene glycol (MIRALAX / GLYCOLAX) packet Take 17 g by mouth daily.    Marland Kitchen senna-docusate (SENNA-PLUS) 8.6-50 MG tablet Take 1 tablet by mouth daily as needed for mild constipation.    . traMADol (ULTRAM) 50 MG tablet Take 50 mg by mouth 2 (two) times daily.      No current facility-administered medications for this visit.     Allergies as of 01/01/2016 - Review Complete 01/01/2016  Allergen Reaction Noted  . Neurontin [gabapentin] Other (See Comments) 12/20/2014  . Septra ds [sulfamethoxazole-trimethoprim] Other (See Comments) 12/20/2014    Past Medical History:  Diagnosis Date  . Anxiety   . Asthma   . Bipolar 1 disorder ([place])   . COPD (chronic obstructive pulmonary disease) ([place])   . Hypertension   . Manic episode, unspecified   .  Muscle weakness (generalized)   . Pain     Past Surgical History:  Procedure Laterality Date  . APPENDECTOMY    . BACK SURGERY    . closed fracture of shaft of right tibia    . WRIST SURGERY Right     Family History  Problem Relation Age of Onset  . Heart disease Father   . Colon cancer Neg Hx     Social History   Social History  . Marital status: Divorced    Spouse name: N/A  . Number of children: 0  . Years of education: N/A   Occupational History  . Not on file.   Social History Main Topics  . Smoking status: Current Every Day Smoker    Packs/day: 1.00    Types: Cigarettes  . Smokeless tobacco: Never Used  . Alcohol use No  . Drug use: No  . Sexual activity: Not on file   Other Topics Concern  . Not on file   Social History  Narrative  . No narrative on file      [id number] provides negative review of systems except for lower extremity edema as outlined above.  General: Negative for anorexia, weight loss, fever, chills, fatigue, weakness. Eyes: Negative for vision changes.  ENT: Negative for hoarseness, difficulty swallowing , nasal congestion. CV: Negative for chest pain, angina, palpitations, dyspnea on exertion, positive peripheral edema.  Respiratory: Negative for dyspnea at rest, dyspnea on exertion, cough, sputum, wheezing.  GI: See history of present illness. GU:  Negative for dysuria, hematuria, urinary incontinence, urinary frequency, nocturnal urination.  MS: Negative for joint pain, low back pain.  Derm: Negative for rash or itching.  Neuro: Negative for weakness, abnormal sensation, seizure, frequent headaches, memory loss, confusion.  Psych: Negative for anxiety, depression, suicidal ideation, hallucinations.  Endo: Negative for unusual weight change.  Heme: Negative for bruising or bleeding. Allergy: Negative for rash or hives.    Physical Examination:  BP 128/64   Pulse (!) 58   Temp 97.9 F (36.6 C) (Oral)   Ht 5\' 9"  (1.753 m)   Wt 156 lb 9.6 oz (71 kg)   BMI 23.13 kg/m    General: Well-nourished, well-developed in no acute distress.  Head: Normocephalic, atraumatic.   Eyes: Conjunctiva pink, no icterus. Mouth: Oropharyngeal mucosa moist and pink , no lesions erythema or exudate. Neck: Supple without thyromegaly, masses, or lymphadenopathy.  Lungs: Clear to auscultation bilaterally.  Heart: Regular rate and rhythm, no murmurs rubs or gallops.  Abdomen: Bowel sounds are normal, nontender, nondistended, no hepatosplenomegaly or masses, no abdominal bruits or    hernia , no rebound or guarding.   Rectal: Deferred Extremities: 2+ pitting edema lower extremity on the left, 1+ on the right. No clubbing or deformities.  Neuro: Alert and oriented x 4 , grossly normal neurologically.   Skin: Warm and dry, no rash or jaundice.   Psych: Alert and cooperative, normal mood and affect.  Labs: Lab Results  Component Value Date   CREATININE 0.84 10/10/2015   BUN 16 10/10/2015   NA 138 10/10/2015   K 4.2 10/10/2015   CL 107 10/10/2015   CO2 28 10/10/2015   Lab Results  Component Value Date   WBC 8.0 10/10/2015   HGB 11.8 (L) 10/10/2015   HCT 34.9 (L) 10/10/2015   MCV 106.4 (H) 10/10/2015   PLT 211 10/10/2015     Imaging Studies: No results found.

## 2016-01-01 NOTE — Patient Instructions (Signed)
1. Please have labs done in next couple of days. 2. Ultrasound of legs to rule out blood clot today.  3. Once we have results back, we will arrange for colonoscopy.

## 2016-01-02 ENCOUNTER — Other Ambulatory Visit: Payer: Self-pay

## 2016-01-02 DIAGNOSIS — Z1211 Encounter for screening for malignant neoplasm of colon: Secondary | ICD-10-CM

## 2016-01-02 NOTE — Progress Notes (Signed)
See LE doppl er result note.

## 2016-01-02 NOTE — Progress Notes (Signed)
Please let patient know her labs and u/s were ok. No DVT. No anemia. B12/folate are normal.  Schedule colonoscopy for screening purposes in OR with RMR. In OR due to polypharmacy.

## 2016-01-07 NOTE — Assessment & Plan Note (Signed)
Macrocytic anemia, obtain further labs for evaluation.

## 2016-01-07 NOTE — Assessment & Plan Note (Signed)
Screening colonoscopy in the near future, deep sedation in the OR given polypharmacy.  I have discussed the risks, alternatives, benefits with regards to but not limited to the risk of reaction to medication, bleeding, infection, perforation and the patient is agreeable to proceed. Written consent to be obtained.

## 2016-01-07 NOTE — Progress Notes (Signed)
cc'ed to pcp °

## 2016-01-07 NOTE — Assessment & Plan Note (Signed)
Recent onset bilateral lower extremity edema, left greater than right. Plan on Doppler study to rule out DVT. If negative, recommend she seek further assistance for edema from PCP. Discussed at length with patient and caregiver.

## 2016-01-09 DIAGNOSIS — F319 Bipolar disorder, unspecified: Secondary | ICD-10-CM | POA: Diagnosis not present

## 2016-01-14 DIAGNOSIS — F319 Bipolar disorder, unspecified: Secondary | ICD-10-CM | POA: Diagnosis not present

## 2016-01-21 NOTE — Patient Instructions (Signed)
Karen Keller  [id number]     @PREFPERIOPPHARMACY @   Your procedure is scheduled on 01/27/2016.  Report to [facility] at 9:30 A.M.  Call this number if you have problems the morning of surgery:  [phone]   Remember:  Do not eat food or drink liquids after midnight.  Take these medicines the morning of surgery with A SIP OF WATER Albuterol inhaler and bring with you to hospital, Wellbutrin, Sinemet, Haldol, Lithium, Ativan, Ditropan  Paxil, Ultram   Do not wear jewelry, make-up or nail polish.  Do not wear lotions, powders, or perfumes, or deoderant.  Do not shave 48 hours prior to surgery.  Men may shave face and neck.  Do not bring valuables to the hospital.  [facility] is not responsible for any belongings or valuables.  Contacts, dentures or bridgework may not be worn into surgery.  Leave your suitcase in the car.  After surgery it may be brought to your room.  For patients admitted to the hospital, discharge time will be determined by your treatment team.  Patients discharged the day of surgery will not be allowed to drive home.    Please read over the following fact sheets that you were given. Anesthesia Post-op Instructions     PATIENT INSTRUCTIONS POST-ANESTHESIA  IMMEDIATELY FOLLOWING SURGERY:  Do not drive or operate machinery for the first twenty four hours after surgery.  Do not make any important decisions for twenty four hours after surgery or while taking narcotic pain medications or sedatives.  If you develop intractable nausea and vomiting or a severe headache please notify your doctor immediately.  FOLLOW-UP:  Please make an appointment with your surgeon as instructed. You do not need to follow up with anesthesia unless specifically instructed to do so.  WOUND CARE INSTRUCTIONS (if applicable):  Keep a dry clean dressing on the anesthesia/puncture wound site if there is drainage.  Once the wound has quit draining you may leave it open to air.   Generally you should leave the bandage intact for twenty four hours unless there is drainage.  If the epidural site drains for more than 36-48 hours please call the anesthesia department.  QUESTIONS?:  Please feel free to call your physician or the hospital operator if you have any questions, and they will be happy to assist you.      Colonoscopy, Adult A colonoscopy is an exam to look at the entire large intestine. During the exam, a lubricated, bendable tube is inserted into the anus and then passed into the rectum, colon, and other parts of the large intestine. A colonoscopy is often done as a part of normal colorectal screening or in response to certain symptoms, such as anemia, persistent diarrhea, abdominal pain, and blood in the stool. The exam can help screen for and diagnose medical problems, including:  Tumors.  Polyps.  Inflammation.  Areas of bleeding. Tell a health care provider about:  Any allergies you have.  All medicines you are taking, including vitamins, herbs, eye drops, creams, and over-the-counter medicines.  Any problems you or family members have had with anesthetic medicines.  Any blood disorders you have.  Any surgeries you have had.  Any medical conditions you have.  Any problems you have had passing stool. What are the risks? Generally, this is a safe procedure. However, problems may occur, including:  Bleeding.  A tear in the intestine.  A reaction to medicines given during the exam.  Infection (rare). What happens before the procedure?  Eating and drinking restrictions  Follow instructions from your health care provider about eating and drinking, which may include:  A few days before the procedure - follow a low-fiber diet. Avoid nuts, seeds, dried fruit, raw fruits, and vegetables.  1-3 days before the procedure - follow a clear liquid diet. Drink only clear liquids, such as clear broth or bouillon, black coffee or tea, clear juice, clear  soft drinks or sports drinks, gelatin desert, and popsicles. Avoid any liquids that contain red or purple dye.  On the day of the procedure - do not eat or drink anything during the 2 hours before the procedure, or within the time period that your health care provider recommends. Bowel prep  If you were prescribed an oral bowel prep to clean out your colon:  Take it as told by your health care provider. Starting the day before your procedure, you will need to drink a large amount of medicated liquid. The liquid will cause you to have multiple loose stools until your stool is almost clear or light green.  If your skin or anus gets irritated from diarrhea, you may use these to relieve the irritation:  Medicated wipes, such as adult wet wipes with aloe and vitamin E.  A skin soothing-product like petroleum jelly.  If you vomit while drinking the bowel prep, take a break for up to 60 minutes and then begin the bowel prep again. If vomiting continues and you cannot take the bowel prep without vomiting, call your health care provider. General instructions  Ask your health care provider about changing or stopping your regular medicines. This is especially important if you are taking diabetes medicines or blood thinners.  Plan to have someone take you home from the hospital or clinic. What happens during the procedure?  An IV tube may be inserted into one of your veins.  You will be given medicine to help you relax (sedative).  To reduce your risk of infection:  Your health care team will wash or sanitize their hands.  Your anal area will be washed with soap.  You will be asked to lie on your side with your knees bent.  Your health care provider will lubricate a long, thin, flexible tube. The tube will have a camera and a light on the end.  The tube will be inserted into your anus.  The tube will be gently eased through your rectum and colon.  Air will be delivered into your colon to  keep it open. You may feel some pressure or cramping.  The camera will be used to take images during the procedure.  A small tissue sample may be removed from your body to be examined under a microscope (biopsy). If any potential problems are found, the tissue will be sent to a lab for testing.  If small polyps are found, your health care provider may remove them and have them checked for cancer cells.  The tube that was inserted into your anus will be slowly removed. The procedure may vary among health care providers and hospitals. What happens after the procedure?  Your blood pressure, heart rate, breathing rate, and blood oxygen level will be monitored until the medicines you were given have worn off.  Do not drive for 24 hours after the exam.  You may have a small amount of blood in your stool.  You may pass gas and have mild abdominal cramping or bloating due to the air that was used to inflate your colon during  the exam.  It is up to you to get the results of your procedure. Ask your health care provider, or the department performing the procedure, when your results will be ready. This information is not intended to replace advice given to you by your health care provider. Make sure you discuss any questions you have with your health care provider. Document Released: 02/21/2000 Document Revised: 09/13/2015 Document Reviewed: 05/07/2015 Elsevier Interactive Patient Education  2017 [employer].

## 2016-01-22 ENCOUNTER — Encounter (HOSPITAL_COMMUNITY)
Admission: RE | Admit: 2016-01-22 | Discharge: 2016-01-22 | Disposition: A | Discharge status: Home / Self Care | Payer: Medicare Other | Source: Ambulatory Visit | Attending: Internal Medicine | Admitting: Internal Medicine

## 2016-01-22 ENCOUNTER — Encounter (HOSPITAL_COMMUNITY): Payer: Self-pay

## 2016-01-22 DIAGNOSIS — F17210 Nicotine dependence, cigarettes, uncomplicated: Secondary | ICD-10-CM | POA: Insufficient documentation

## 2016-01-22 DIAGNOSIS — Z01812 Encounter for preprocedural laboratory examination: Secondary | ICD-10-CM | POA: Diagnosis not present

## 2016-01-22 DIAGNOSIS — F319 Bipolar disorder, unspecified: Secondary | ICD-10-CM | POA: Insufficient documentation

## 2016-01-22 DIAGNOSIS — J449 Chronic obstructive pulmonary disease, unspecified: Secondary | ICD-10-CM | POA: Insufficient documentation

## 2016-01-22 DIAGNOSIS — D649 Anemia, unspecified: Secondary | ICD-10-CM | POA: Diagnosis not present

## 2016-01-22 DIAGNOSIS — I10 Essential (primary) hypertension: Secondary | ICD-10-CM | POA: Insufficient documentation

## 2016-01-22 HISTORY — DX: Unspecified convulsions: R56.9

## 2016-01-22 LAB — BASIC METABOLIC PANEL
Anion gap: 7 (ref 5–15)
BUN: 15 mg/dL (ref 6–20)
CHLORIDE: 107 mmol/L (ref 101–111)
CO2: 25 mmol/L (ref 22–32)
CREATININE: 0.81 mg/dL (ref 0.44–1.00)
Calcium: 10 mg/dL (ref 8.9–10.3)
GFR calc Af Amer: >60 mL/min (ref >60)
GLUCOSE: 77 mg/dL (ref 65–99)
Potassium: 4.4 mmol/L (ref 3.5–5.1)
SODIUM: 139 mmol/L (ref 135–145)

## 2016-01-22 LAB — CBC
HCT: 37.3 % (ref 36.0–46.0)
Hemoglobin: 12.8 g/dL (ref 12.0–15.0)
MCH: 35.5 pg — AB (ref 26.0–34.0)
MCHC: 34.3 g/dL (ref 30.0–36.0)
MCV: 103.3 fL — AB (ref 78.0–100.0)
PLATELETS: 266 10*3/uL (ref 150–400)
RBC: 3.61 MIL/uL — ABNORMAL LOW (ref 3.87–5.11)
RDW: 13.3 % (ref 11.5–15.5)
WBC: 8.8 10*3/uL (ref 4.0–10.5)

## 2016-01-27 ENCOUNTER — Ambulatory Visit (HOSPITAL_COMMUNITY): Payer: Medicare Other | Admitting: Anesthesiology

## 2016-01-27 ENCOUNTER — Ambulatory Visit (HOSPITAL_COMMUNITY)
Admission: RE | Admit: 2016-01-27 | Discharge: 2016-01-27 | Disposition: A | Discharge status: Home / Self Care | Payer: Medicare Other | Source: Ambulatory Visit | Attending: Internal Medicine | Admitting: Internal Medicine

## 2016-01-27 ENCOUNTER — Encounter (HOSPITAL_COMMUNITY): Payer: Self-pay | Admitting: *Deleted

## 2016-01-27 ENCOUNTER — Encounter (HOSPITAL_COMMUNITY)
Admission: RE | Disposition: A | Discharge status: Home / Self Care | Payer: Self-pay | Source: Ambulatory Visit | Attending: Internal Medicine

## 2016-01-27 DIAGNOSIS — Z883 Allergy status to other anti-infective agents status: Secondary | ICD-10-CM | POA: Diagnosis not present

## 2016-01-27 DIAGNOSIS — Z9889 Other specified postprocedural states: Secondary | ICD-10-CM | POA: Insufficient documentation

## 2016-01-27 DIAGNOSIS — M6281 Muscle weakness (generalized): Secondary | ICD-10-CM | POA: Insufficient documentation

## 2016-01-27 DIAGNOSIS — Z79899 Other long term (current) drug therapy: Secondary | ICD-10-CM | POA: Insufficient documentation

## 2016-01-27 DIAGNOSIS — Z7982 Long term (current) use of aspirin: Secondary | ICD-10-CM | POA: Diagnosis not present

## 2016-01-27 DIAGNOSIS — Z9049 Acquired absence of other specified parts of digestive tract: Secondary | ICD-10-CM | POA: Diagnosis not present

## 2016-01-27 DIAGNOSIS — I10 Essential (primary) hypertension: Secondary | ICD-10-CM | POA: Insufficient documentation

## 2016-01-27 DIAGNOSIS — Z888 Allergy status to other drugs, medicaments and biological substances status: Secondary | ICD-10-CM | POA: Insufficient documentation

## 2016-01-27 DIAGNOSIS — Z1211 Encounter for screening for malignant neoplasm of colon: Secondary | ICD-10-CM | POA: Insufficient documentation

## 2016-01-27 DIAGNOSIS — Z1212 Encounter for screening for malignant neoplasm of rectum: Secondary | ICD-10-CM | POA: Diagnosis not present

## 2016-01-27 DIAGNOSIS — J449 Chronic obstructive pulmonary disease, unspecified: Secondary | ICD-10-CM | POA: Insufficient documentation

## 2016-01-27 DIAGNOSIS — F419 Anxiety disorder, unspecified: Secondary | ICD-10-CM | POA: Diagnosis not present

## 2016-01-27 DIAGNOSIS — Z8249 Family history of ischemic heart disease and other diseases of the circulatory system: Secondary | ICD-10-CM | POA: Diagnosis not present

## 2016-01-27 HISTORY — PX: COLONOSCOPY WITH PROPOFOL: SHX5780

## 2016-01-27 SURGERY — COLONOSCOPY WITH PROPOFOL
Anesthesia: Monitor Anesthesia Care

## 2016-01-27 MED ORDER — FENTANYL CITRATE (PF) 100 MCG/2ML IJ SOLN
INTRAMUSCULAR | Status: AC
  Filled 2016-01-27: qty 2

## 2016-01-27 MED ORDER — PROPOFOL 500 MG/50ML IV EMUL
INTRAVENOUS | Status: DC | PRN
  Administered 2016-01-27: 13:00:00 via INTRAVENOUS
  Administered 2016-01-27: 100 ug/kg/min via INTRAVENOUS

## 2016-01-27 MED ORDER — MIDAZOLAM HCL 2 MG/2ML IJ SOLN
INTRAMUSCULAR | Status: AC
  Filled 2016-01-27: qty 2

## 2016-01-27 MED ORDER — MIDAZOLAM HCL 2 MG/2ML IJ SOLN
1.0000 mg | INTRAMUSCULAR | Status: DC | PRN
  Administered 2016-01-27: 2 mg via INTRAVENOUS

## 2016-01-27 MED ORDER — GLYCOPYRROLATE 0.2 MG/ML IJ SOLN
0.2000 mg | Freq: Once | INTRAMUSCULAR | Status: AC | PRN
Start: 2016-01-27 — End: 2016-01-27
  Administered 2016-01-27: 0.2 mg via INTRAVENOUS

## 2016-01-27 MED ORDER — GLYCOPYRROLATE 0.2 MG/ML IJ SOLN
INTRAMUSCULAR | Status: AC
Start: 2016-01-27 — End: 2016-01-27
  Filled 2016-01-27: qty 1

## 2016-01-27 MED ORDER — MIDAZOLAM HCL 5 MG/5ML IJ SOLN
INTRAMUSCULAR | Status: DC | PRN
  Administered 2016-01-27: 2 mg via INTRAVENOUS

## 2016-01-27 MED ORDER — PROPOFOL 10 MG/ML IV BOLUS
INTRAVENOUS | Status: AC
  Filled 2016-01-27: qty 40

## 2016-01-27 MED ORDER — CHLORHEXIDINE GLUCONATE CLOTH 2 % EX PADS: 6.0000 | MEDICATED_PAD | Freq: Once | CUTANEOUS | Status: DC

## 2016-01-27 MED ORDER — FENTANYL CITRATE (PF) 100 MCG/2ML IJ SOLN
25.0000 ug | INTRAMUSCULAR | Status: AC | PRN
  Administered 2016-01-27 (×2): 25 ug via INTRAVENOUS

## 2016-01-27 MED ORDER — LACTATED RINGERS IV SOLN
INTRAVENOUS | Status: DC
  Administered 2016-01-27: 1000 mL via INTRAVENOUS

## 2016-01-27 NOTE — Interval H&P Note (Signed)
History and Physical Interval Note:  [id number] AM  Karen Keller  has presented today for surgery, with the diagnosis of SCREENING  The various methods of treatment have been discussed with the patient and family. After consideration of risks, benefits and other options for treatment, the patient has consented to  Procedure(s) with comments: COLONOSCOPY WITH PROPOFOL (N/A) - 1100 as a surgical intervention .  The patient's history has been reviewed, patient examined, no change in status, stable for surgery.  I have reviewed the patient's chart and labs.  Questions were answered to the patient's satisfaction.     Karen Keller  No change. Screening colonoscopy per plan.  The risks, benefits, limitations, alternatives and imponderables have been reviewed with the patient. Questions have been answered. All parties are agreeable.

## 2016-01-27 NOTE — H&P (View-Only) (Signed)
See LE doppl er result note.

## 2016-01-27 NOTE — Transfer of Care (Signed)
Immediate Anesthesia Transfer of Care Note  Patient: Karen Keller  Procedure(s) Performed: Procedure(s) with comments: COLONOSCOPY WITH PROPOFOL (N/A) - 1100  Patient Location: PACU  Anesthesia Type:MAC  Level of Consciousness: awake and patient cooperative  Airway & Oxygen Therapy: Patient Spontanous Breathing and Patient connected to face mask oxygen  Post-op Assessment: Report given to RN, Post -op Vital signs reviewed and stable and Patient moving all extremities  Post vital signs: Reviewed and stable  Last Vitals:  Vitals:   01/27/16 1205 01/27/16 1210  BP: 130/65 135/67  Pulse:    Resp: 14 (!) 30  Temp:      Last Pain:  Vitals:   01/27/16 1059  TempSrc: Oral      Patients Stated Pain Goal: 7 ([id number])  Complications: No apparent anesthesia complications

## 2016-01-27 NOTE — Discharge Instructions (Signed)
°  Colonoscopy Discharge Instructions  Read the instructions outlined below and refer to this sheet in the next few weeks. These discharge instructions provide you with general information on caring for yourself after you leave the hospital. Your doctor may also give you specific instructions. While your treatment has been planned according to the most current medical practices available, unavoidable complications occasionally occur. If you have any problems or questions after discharge, call Dr. Gala Romney at [phone]. ACTIVITY  You may resume your regular activity, but move at a slower pace for the next 24 hours.   Take frequent rest periods for the next 24 hours.   Walking will help get rid of the air and reduce the bloated feeling in your belly (abdomen).   No driving for 24 hours (because of the medicine (anesthesia) used during the test).    Do not sign any important legal documents or operate any machinery for 24 hours (because of the anesthesia used during the test).  NUTRITION  Drink plenty of fluids.   You may resume your normal diet as instructed by your doctor.   Begin with a light meal and progress to your normal diet. Heavy or fried foods are harder to digest and may make you feel sick to your stomach (nauseated).   Avoid alcoholic beverages for 24 hours or as instructed.  MEDICATIONS  You may resume your normal medications unless your doctor tells you otherwise.  WHAT YOU CAN EXPECT TODAY  Some feelings of bloating in the abdomen.   Passage of more gas than usual.   Spotting of blood in your stool or on the toilet paper.  IF YOU HAD POLYPS REMOVED DURING THE COLONOSCOPY:  No aspirin products for 7 days or as instructed.   No alcohol for 7 days or as instructed.   Eat a soft diet for the next 24 hours.  FINDING OUT THE RESULTS OF YOUR TEST Not all test results are available during your visit. If your test results are not back during the visit, make an appointment  with your caregiver to find out the results. Do not assume everything is normal if you have not heard from your caregiver or the medical facility. It is important for you to follow up on all of your test results.  SEEK IMMEDIATE MEDICAL ATTENTION IF:  You have more than a spotting of blood in your stool.   Your belly is swollen (abdominal distention).   You are nauseated or vomiting.   You have a temperature over 101.   You have abdominal pain or discomfort that is severe or gets worse throughout the day.      Your colonoscopy preparation was totally inadequate to do perform a thorough examination of your colon.  I recommend you  return in one year to have a repeat screening colonoscopy.  Office visit with [place] in one year

## 2016-01-27 NOTE — Anesthesia Preprocedure Evaluation (Addendum)
Anesthesia Evaluation  Patient identified by MRN, date of birth, ID band Patient awake    Reviewed: Allergy & Precautions, NPO status , Patient's Chart, lab work & pertinent test results  Airway Mallampati: II  TM Distance: >3 FB     Dental  (+) Poor Dentition, Chipped, Missing   Pulmonary asthma , COPD (chronic cough), Current Smoker,   Clear after coughing   breath sounds clear to auscultation       Cardiovascular hypertension, Pt. on medications  Rhythm:Regular Rate:Normal     Neuro/Psych Seizures -, Well Controlled,  PSYCHIATRIC DISORDERS Anxiety Bipolar Disorder    GI/Hepatic negative GI ROS,   Endo/Other    Renal/GU      Musculoskeletal   Abdominal   Peds  Hematology  (+) anemia ,   Anesthesia Other Findings   Reproductive/Obstetrics                             Anesthesia Physical Anesthesia Plan  ASA: III  Anesthesia Plan: MAC   Post-op Pain Management:    Induction: Intravenous  Airway Management Planned: Simple Face Mask  Additional Equipment:   Intra-op Plan:   Post-operative Plan:   Informed Consent: I have reviewed the patients History and Physical, chart, labs and discussed the procedure including the risks, benefits and alternatives for the proposed anesthesia with the patient or authorized representative who has indicated his/her understanding and acceptance.     Plan Discussed with:   Anesthesia Plan Comments:         Anesthesia Quick Evaluation

## 2016-01-27 NOTE — Anesthesia Postprocedure Evaluation (Signed)
Anesthesia Post Note  Patient: Karen Keller  Procedure(s) Performed: Procedure(s) (LRB): COLONOSCOPY WITH PROPOFOL (N/A)  Patient location during evaluation: PACU Anesthesia Type: MAC Level of consciousness: awake, oriented and patient cooperative Pain management: pain level controlled Vital Signs Assessment: post-procedure vital signs reviewed and stable Respiratory status: spontaneous breathing, nonlabored ventilation and respiratory function stable Cardiovascular status: blood pressure returned to baseline Postop Assessment: no signs of nausea or vomiting Anesthetic complications: no    Last Vitals:  Vitals:   01/27/16 1205 01/27/16 1210  BP: 130/65 135/67  Pulse:    Resp: 14 (!) 30  Temp:      Last Pain:  Vitals:   01/27/16 1059  TempSrc: Oral                 Roselin Wiemann J

## 2016-01-27 NOTE — Op Note (Signed)
[facility] Patient Name: Karen Keller Procedure Date: 01/27/2016 10:20 AM MRN: [id number] Date of Birth: 1956/02/17 Attending MD: Norvel Richards , MD CSN: [id number] Age: 60 Admit Type: Outpatient Procedure:                Colonoscopy Indications:              Screening for colorectal malignant neoplasm Providers:                Norvel Richards, MD, Hinton Rao, RN, Sherlyn Lees, Technician Referring MD:              Medicines:                Propofol per Anesthesia Complications:            No immediate complications. Estimated Blood Loss:     Estimated blood loss: none. Procedure:                Pre-Anesthesia Assessment:                           - Prior to the procedure, a History and Physical                            was performed, and patient medications and                            allergies were reviewed. The patient's tolerance of                            previous anesthesia was also reviewed. The risks                            and benefits of the procedure and the sedation                            options and risks were discussed with the patient.                            All questions were answered, and informed consent                            was obtained. Prior Anticoagulants: The patient has                            taken no previous anticoagulant or antiplatelet                            agents. ASA Grade Assessment: III - A patient with                            severe systemic disease. After reviewing the risks  and benefits, the patient was deemed in                            satisfactory condition to undergo the procedure.                           After obtaining informed consent, the colonoscope                            was passed under direct vision. Throughout the                            procedure, the patient's blood pressure, pulse, and           oxygen saturations were monitored continuously. The                            [id number] scope was introduced through                            the and advanced to the the cecum, identified by                            appendiceal orifice and ileocecal valve. The entire                            colon was not well visualized. The ileocecal valve,                            appendiceal orifice, and rectum were photographed.                            The colonoscopy was performed without difficulty.                            The quality of the bowel preparation was inadequate. Scope In: 12:24:09 PM Scope Out: 12:46:45 PM Scope Withdrawal Time: 0 hours 4 minutes 43 seconds  Total Procedure Duration: 0 hours 22 minutes 36 seconds  Findings:      The perianal and digital rectal examinations were normal.      Colonoscopy preparation inadequate. Vegetable matter /viscous stool       hampered exam throughout colon. Much of the colonic mucosa not seen. Impression:               - Preparation of the colon was inadequate.                           - No specimens collected. Moderate Sedation:      Moderate (conscious) sedation was personally administered by an       anesthesia professional. The following parameters were monitored: oxygen       saturation, heart rate, blood pressure, respiratory rate, EKG, adequacy       of pulmonary ventilation, and response to care. Total physician       intraservice time was 28 minutes. Recommendation:           -  Patient has a contact number available for                            emergencies. The signs and symptoms of potential                            delayed complications were discussed with the                            patient. Return to normal activities tomorrow.                            Written discharge instructions were provided to the                            patient.                           - Resume previous diet.                            - Continue present medications.                           - Repeat colonoscopy in 1 year for screening                            purposes.                           - Return to GI office in 1 year. Procedure Code(s):        --- Professional ---                           [phone], Colonoscopy, flexible; diagnostic, including                            collection of specimen(s) by brushing or washing,                            when performed (separate procedure) Diagnosis Code(s):        --- Professional ---                           Z12.11, Encounter for screening for malignant                            neoplasm of colon CPT copyright 2016 American Medical Association. All rights reserved. The codes documented in this report are preliminary and upon coder review may  be revised to meet current compliance requirements. [name]. [name], MD Norvel Richards, MD 01/27/2016 1:16:27 PM This report has been signed electronically. Number of Addenda: 0

## 2016-01-29 ENCOUNTER — Encounter: Payer: Self-pay | Admitting: Podiatry

## 2016-01-29 ENCOUNTER — Ambulatory Visit (INDEPENDENT_AMBULATORY_CARE_PROVIDER_SITE_OTHER): Payer: Medicare Other | Admitting: Podiatry

## 2016-01-29 DIAGNOSIS — B351 Tinea unguium: Secondary | ICD-10-CM | POA: Diagnosis not present

## 2016-01-29 DIAGNOSIS — L603 Nail dystrophy: Secondary | ICD-10-CM

## 2016-01-29 DIAGNOSIS — M79609 Pain in unspecified limb: Secondary | ICD-10-CM | POA: Diagnosis not present

## 2016-01-29 DIAGNOSIS — L608 Other nail disorders: Secondary | ICD-10-CM | POA: Diagnosis not present

## 2016-01-29 DIAGNOSIS — M79676 Pain in unspecified toe(s): Secondary | ICD-10-CM

## 2016-01-29 NOTE — Interval H&P Note (Signed)
History and Physical Interval Note:  [id number] PM  Karen Keller  has presented today for surgery, with the diagnosis of * No surgery found *  The various methods of treatment have been discussed with the patient and family. After consideration of risks, benefits and other options for treatment, the patient has consented to  * No surgery found * as a surgical intervention .  The patient's history has been reviewed, patient examined, no change in status, stable for surgery.  I have reviewed the patient's chart and labs.  Questions were answered to the patient's satisfaction.     Karen Keller  No change. The risks, benefits, limitations, alternatives and imponderables have been reviewed with the patient. Questions have been answered. All parties are agreeable.

## 2016-01-29 NOTE — H&P (Signed)
error 

## 2016-01-29 NOTE — Progress Notes (Signed)
   Subjective:    Patient ID: Karen Keller, female    DOB: [birth year], 60 y.o.   MRN: [id number]  HPI    Review of Systems  All other systems reviewed and are negative.      Objective:   Physical Exam        Assessment & Plan:

## 2016-01-29 NOTE — Progress Notes (Signed)
Patient ID: Karen Keller, female   DOB: [birth year], 60 y.o.   MRN: [id number] SUBJECTIVE Patient  presents to office today complaining of elongated, thickened nails. Pain while ambulating in shoes. Patient is unable to trim their own nails.   OBJECTIVE General Patient is awake, alert, and oriented x 3 and in no acute distress. Derm Skin is dry and supple bilateral. Negative open lesions or macerations. Remaining integument unremarkable. Nails are tender, long, thickened and dystrophic with subungual debris, consistent with onychomycosis, 1-5 bilateral. No signs of infection noted. Vasc  DP and PT pedal pulses palpable bilaterally. Temperature gradient within normal limits.  Neuro Epicritic and protective threshold sensation diminished bilaterally.  Musculoskeletal Exam No symptomatic pedal deformities noted bilateral. Muscular strength within normal limits.  ASSESSMENT 1. Onychodystrophic nails 1-5 bilateral with hyperkeratosis of nails.  2. Onychomycosis of nail due to dermatophyte bilateral 3. Pain in foot bilateral  PLAN OF CARE 1. Patient evaluated today.  2. Instructed to maintain good pedal hygiene and foot care.  3. Mechanical debridement of nails 1-5 bilaterally performed using a nail nipper. Filed with dremel without incident.  4. Return to clinic in 3 mos.    Edrick Kins, DPM

## 2016-01-29 NOTE — H&P (View-Only) (Signed)
Primary Care Physician:  Rosita Fire, MD  Primary Gastroenterologist:  Garfield Cornea, MD   Chief Complaint  Patient presents with  . Colonoscopy    never had one, no problems    HPI:  Karen Keller is a 60 y.o. female here to schedule first ever screening colonoscopy. Patient resides at [facility]. She denies constipation, diarrhea, melena, rectal bleeding, abdominal pain, heartburn, dysphagia, vomiting, weight loss. She has noted some lower extremity edema, left greater than right. She takes MiraLAX daily which keeps her bowels regular.   Current Outpatient Prescriptions  Medication Sig Dispense Refill  . acetaminophen (TYLENOL) 325 MG tablet Take 975 mg by mouth every 8 (eight) hours.     Marland Kitchen albuterol (PROAIR HFA) 108 (90 Base) MCG/ACT inhaler Inhale 2 puffs into the lungs every 4 (four) hours as needed for wheezing or shortness of breath.    Marland Kitchen aspirin 325 MG EC tablet Take 325 mg by mouth 2 (two) times daily.    . B Complex-Biotin-FA (B-COMPLEX PO) Take 1 tablet by mouth daily.    Marland Kitchen buPROPion (WELLBUTRIN XL) 300 MG 24 hr tablet Take 300 mg by mouth daily.    . calcium-vitamin D (OSCAL WITH D) 500-200 MG-UNIT tablet Take 1 tablet by mouth 2 (two) times daily with a meal.    . carbidopa-levodopa (SINEMET IR) 10-100 MG tablet Take 1 tablet by mouth 3 (three) times daily.    . divalproex (DEPAKOTE SPRINKLES) 125 MG capsule Take 500 mg by mouth 2 (two) times daily.    Marland Kitchen docusate sodium (COLACE) 100 MG capsule Take 100 mg by mouth daily.    . haloperidol (HALDOL) 1 MG tablet Take 1 mg by mouth 2 (two) times daily.    . hydrALAZINE (APRESOLINE) 25 MG tablet Take 25 mg by mouth 4 (four) times daily.    Marland Kitchen lithium 300 MG tablet Take 300 mg by mouth 2 (two) times daily.    Marland Kitchen LORazepam (ATIVAN) 0.5 MG tablet Take 0.5 mg by mouth every 12 (twelve) hours as needed for anxiety.    . Multiple Vitamin (MULTIVITAMIN WITH MINERALS) TABS tablet Take 1 tablet by mouth daily.    . naproxen  (NAPROSYN) 500 MG tablet Take 500 mg by mouth 2 (two) times daily as needed for mild pain ((chest pain)).    Marland Kitchen oxybutynin (DITROPAN-XL) 10 MG 24 hr tablet Take 10 mg by mouth daily.    Marland Kitchen PARoxetine (PAXIL) 10 MG tablet Take 10 mg by mouth daily. Takes 10 mg and 40 mg= 50 mg    . PARoxetine (PAXIL) 40 MG tablet Take 40 mg by mouth daily. Takes 10mg  and 40mg  for a total of 40mg  daily    . polyethylene glycol (MIRALAX / GLYCOLAX) packet Take 17 g by mouth daily.    Marland Kitchen senna-docusate (SENNA-PLUS) 8.6-50 MG tablet Take 1 tablet by mouth daily as needed for mild constipation.    . traMADol (ULTRAM) 50 MG tablet Take 50 mg by mouth 2 (two) times daily.      No current facility-administered medications for this visit.     Allergies as of 01/01/2016 - Review Complete 01/01/2016  Allergen Reaction Noted  . Neurontin [gabapentin] Other (See Comments) 12/20/2014  . Septra ds [sulfamethoxazole-trimethoprim] Other (See Comments) 12/20/2014    Past Medical History:  Diagnosis Date  . Anxiety   . Asthma   . Bipolar 1 disorder ([place])   . COPD (chronic obstructive pulmonary disease) ([place])   . Hypertension   . Manic episode, unspecified   .  Muscle weakness (generalized)   . Pain     Past Surgical History:  Procedure Laterality Date  . APPENDECTOMY    . BACK SURGERY    . closed fracture of shaft of right tibia    . WRIST SURGERY Right     Family History  Problem Relation Age of Onset  . Heart disease Father   . Colon cancer Neg Hx     Social History   Social History  . Marital status: Divorced    Spouse name: N/A  . Number of children: 0  . Years of education: N/A   Occupational History  . Not on file.   Social History Main Topics  . Smoking status: Current Every Day Smoker    Packs/day: 1.00    Types: Cigarettes  . Smokeless tobacco: Never Used  . Alcohol use No  . Drug use: No  . Sexual activity: Not on file   Other Topics Concern  . Not on file   Social History  Narrative  . No narrative on file      [id number] provides negative review of systems except for lower extremity edema as outlined above.  General: Negative for anorexia, weight loss, fever, chills, fatigue, weakness. Eyes: Negative for vision changes.  ENT: Negative for hoarseness, difficulty swallowing , nasal congestion. CV: Negative for chest pain, angina, palpitations, dyspnea on exertion, positive peripheral edema.  Respiratory: Negative for dyspnea at rest, dyspnea on exertion, cough, sputum, wheezing.  GI: See history of present illness. GU:  Negative for dysuria, hematuria, urinary incontinence, urinary frequency, nocturnal urination.  MS: Negative for joint pain, low back pain.  Derm: Negative for rash or itching.  Neuro: Negative for weakness, abnormal sensation, seizure, frequent headaches, memory loss, confusion.  Psych: Negative for anxiety, depression, suicidal ideation, hallucinations.  Endo: Negative for unusual weight change.  Heme: Negative for bruising or bleeding. Allergy: Negative for rash or hives.    Physical Examination:  BP 128/64   Pulse (!) 58   Temp 97.9 F (36.6 C) (Oral)   Ht 5\' 9"  (1.753 m)   Wt 156 lb 9.6 oz (71 kg)   BMI 23.13 kg/m    General: Well-nourished, well-developed in no acute distress.  Head: Normocephalic, atraumatic.   Eyes: Conjunctiva pink, no icterus. Mouth: Oropharyngeal mucosa moist and pink , no lesions erythema or exudate. Neck: Supple without thyromegaly, masses, or lymphadenopathy.  Lungs: Clear to auscultation bilaterally.  Heart: Regular rate and rhythm, no murmurs rubs or gallops.  Abdomen: Bowel sounds are normal, nontender, nondistended, no hepatosplenomegaly or masses, no abdominal bruits or    hernia , no rebound or guarding.   Rectal: Deferred Extremities: 2+ pitting edema lower extremity on the left, 1+ on the right. No clubbing or deformities.  Neuro: Alert and oriented x 4 , grossly normal neurologically.   Skin: Warm and dry, no rash or jaundice.   Psych: Alert and cooperative, normal mood and affect.  Labs: Lab Results  Component Value Date   CREATININE 0.84 10/10/2015   BUN 16 10/10/2015   NA 138 10/10/2015   K 4.2 10/10/2015   CL 107 10/10/2015   CO2 28 10/10/2015   Lab Results  Component Value Date   WBC 8.0 10/10/2015   HGB 11.8 (L) 10/10/2015   HCT 34.9 (L) 10/10/2015   MCV 106.4 (H) 10/10/2015   PLT 211 10/10/2015     Imaging Studies: No results found.

## 2016-02-04 DIAGNOSIS — F17200 Nicotine dependence, unspecified, uncomplicated: Secondary | ICD-10-CM | POA: Diagnosis not present

## 2016-02-04 DIAGNOSIS — Z23 Encounter for immunization: Secondary | ICD-10-CM | POA: Diagnosis not present

## 2016-02-04 DIAGNOSIS — J441 Chronic obstructive pulmonary disease with (acute) exacerbation: Secondary | ICD-10-CM | POA: Diagnosis not present

## 2016-02-05 ENCOUNTER — Encounter (HOSPITAL_COMMUNITY): Payer: Self-pay | Admitting: Internal Medicine

## 2016-02-05 ENCOUNTER — Ambulatory Visit (HOSPITAL_COMMUNITY)
Admission: RE | Admit: 2016-02-05 | Discharge: 2016-02-05 | Disposition: A | Discharge status: Home / Self Care | Payer: Medicare Other | Source: Ambulatory Visit | Attending: Internal Medicine | Admitting: Internal Medicine

## 2016-02-05 ENCOUNTER — Other Ambulatory Visit (HOSPITAL_COMMUNITY): Payer: Self-pay | Admitting: Internal Medicine

## 2016-02-05 DIAGNOSIS — J441 Chronic obstructive pulmonary disease with (acute) exacerbation: Secondary | ICD-10-CM | POA: Insufficient documentation

## 2016-02-05 DIAGNOSIS — R05 Cough: Secondary | ICD-10-CM | POA: Diagnosis not present

## 2016-02-05 DIAGNOSIS — R0602 Shortness of breath: Secondary | ICD-10-CM | POA: Diagnosis not present

## 2016-02-12 DIAGNOSIS — F319 Bipolar disorder, unspecified: Secondary | ICD-10-CM | POA: Diagnosis not present

## 2016-02-28 ENCOUNTER — Ambulatory Visit (HOSPITAL_COMMUNITY)
Admission: RE | Admit: 2016-02-28 | Discharge: 2016-02-28 | Disposition: A | Discharge status: Home / Self Care | Payer: Medicare Other | Source: Ambulatory Visit | Attending: Internal Medicine | Admitting: Internal Medicine

## 2016-02-28 ENCOUNTER — Other Ambulatory Visit (HOSPITAL_COMMUNITY): Payer: Self-pay | Admitting: Internal Medicine

## 2016-02-28 DIAGNOSIS — J1100 Influenza due to unidentified influenza virus with unspecified type of pneumonia: Secondary | ICD-10-CM

## 2016-02-28 DIAGNOSIS — J449 Chronic obstructive pulmonary disease, unspecified: Secondary | ICD-10-CM | POA: Diagnosis not present

## 2016-02-28 DIAGNOSIS — F17200 Nicotine dependence, unspecified, uncomplicated: Secondary | ICD-10-CM | POA: Diagnosis not present

## 2016-02-28 DIAGNOSIS — Z8701 Personal history of pneumonia (recurrent): Secondary | ICD-10-CM | POA: Diagnosis not present

## 2016-02-28 DIAGNOSIS — R05 Cough: Secondary | ICD-10-CM | POA: Diagnosis not present

## 2016-02-28 DIAGNOSIS — F319 Bipolar disorder, unspecified: Secondary | ICD-10-CM | POA: Diagnosis not present

## 2016-02-28 DIAGNOSIS — J189 Pneumonia, unspecified organism: Secondary | ICD-10-CM | POA: Diagnosis not present

## 2016-04-09 DIAGNOSIS — F319 Bipolar disorder, unspecified: Secondary | ICD-10-CM | POA: Diagnosis not present

## 2016-04-10 ENCOUNTER — Emergency Department (HOSPITAL_COMMUNITY)
Admission: EM | Admit: 2016-04-10 | Discharge: 2016-04-10 | Disposition: A | Discharge status: Home / Self Care | Payer: Medicare Other | Attending: Emergency Medicine | Admitting: Emergency Medicine

## 2016-04-10 ENCOUNTER — Emergency Department (HOSPITAL_COMMUNITY): Payer: Medicare Other

## 2016-04-10 ENCOUNTER — Encounter (HOSPITAL_COMMUNITY): Payer: Self-pay

## 2016-04-10 DIAGNOSIS — J45909 Unspecified asthma, uncomplicated: Secondary | ICD-10-CM | POA: Diagnosis not present

## 2016-04-10 DIAGNOSIS — W2201XA Walked into wall, initial encounter: Secondary | ICD-10-CM | POA: Diagnosis not present

## 2016-04-10 DIAGNOSIS — R531 Weakness: Secondary | ICD-10-CM | POA: Diagnosis not present

## 2016-04-10 DIAGNOSIS — Y929 Unspecified place or not applicable: Secondary | ICD-10-CM | POA: Diagnosis not present

## 2016-04-10 DIAGNOSIS — Y999 Unspecified external cause status: Secondary | ICD-10-CM | POA: Diagnosis not present

## 2016-04-10 DIAGNOSIS — M19031 Primary osteoarthritis, right wrist: Secondary | ICD-10-CM | POA: Insufficient documentation

## 2016-04-10 DIAGNOSIS — Z7982 Long term (current) use of aspirin: Secondary | ICD-10-CM | POA: Diagnosis not present

## 2016-04-10 DIAGNOSIS — S6991XA Unspecified injury of right wrist, hand and finger(s), initial encounter: Secondary | ICD-10-CM | POA: Diagnosis present

## 2016-04-10 DIAGNOSIS — F17210 Nicotine dependence, cigarettes, uncomplicated: Secondary | ICD-10-CM | POA: Insufficient documentation

## 2016-04-10 DIAGNOSIS — J449 Chronic obstructive pulmonary disease, unspecified: Secondary | ICD-10-CM | POA: Insufficient documentation

## 2016-04-10 DIAGNOSIS — I10 Essential (primary) hypertension: Secondary | ICD-10-CM | POA: Insufficient documentation

## 2016-04-10 DIAGNOSIS — S60211A Contusion of right wrist, initial encounter: Secondary | ICD-10-CM

## 2016-04-10 DIAGNOSIS — Y939 Activity, unspecified: Secondary | ICD-10-CM | POA: Insufficient documentation

## 2016-04-10 DIAGNOSIS — Z79899 Other long term (current) drug therapy: Secondary | ICD-10-CM | POA: Diagnosis not present

## 2016-04-10 MED ORDER — ACETAMINOPHEN 500 MG PO TABS
1000.0000 mg | ORAL_TABLET | Freq: Once | ORAL | Status: AC
  Administered 2016-04-10: 1000 mg via ORAL
  Filled 2016-04-10: qty 2
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
## 2016-04-10 NOTE — Discharge Instructions (Signed)
° °  Your vital signs within normal limits. The x-ray of your wrist is negative for new fracture. The previous surgical repair remains intact. There is noted some arthritis in your wrist area. Your examination favors a bruise or contusion to your wrist. Please use the Ace wrap for the next 3 or 4 days. Please see Dr. Legrand Rams for additional evaluation and management if not improving.

## 2016-04-29 ENCOUNTER — Ambulatory Visit (INDEPENDENT_AMBULATORY_CARE_PROVIDER_SITE_OTHER): Payer: Medicare Other | Admitting: Podiatry

## 2016-04-29 DIAGNOSIS — F319 Bipolar disorder, unspecified: Secondary | ICD-10-CM | POA: Diagnosis not present

## 2016-04-29 NOTE — Progress Notes (Signed)
ERRONEOUS ENCOUNTER NO SHOW  

## 2016-05-22 DIAGNOSIS — F319 Bipolar disorder, unspecified: Secondary | ICD-10-CM | POA: Diagnosis not present

## 2016-05-29 ENCOUNTER — Other Ambulatory Visit (HOSPITAL_COMMUNITY): Payer: Self-pay | Admitting: Internal Medicine

## 2016-05-29 DIAGNOSIS — F319 Bipolar disorder, unspecified: Secondary | ICD-10-CM | POA: Diagnosis not present

## 2016-05-29 DIAGNOSIS — I10 Essential (primary) hypertension: Secondary | ICD-10-CM | POA: Diagnosis not present

## 2016-05-29 DIAGNOSIS — E785 Hyperlipidemia, unspecified: Secondary | ICD-10-CM | POA: Diagnosis not present

## 2016-05-29 DIAGNOSIS — G20 Parkinson's disease: Secondary | ICD-10-CM | POA: Diagnosis not present

## 2016-05-29 DIAGNOSIS — J449 Chronic obstructive pulmonary disease, unspecified: Secondary | ICD-10-CM | POA: Diagnosis not present

## 2016-05-29 DIAGNOSIS — Z1231 Encounter for screening mammogram for malignant neoplasm of breast: Secondary | ICD-10-CM

## 2016-05-29 DIAGNOSIS — F17218 Nicotine dependence, cigarettes, with other nicotine-induced disorders: Secondary | ICD-10-CM | POA: Diagnosis not present

## 2016-05-29 DIAGNOSIS — F17200 Nicotine dependence, unspecified, uncomplicated: Secondary | ICD-10-CM | POA: Diagnosis not present

## 2016-05-29 DIAGNOSIS — F3162 Bipolar disorder, current episode mixed, moderate: Secondary | ICD-10-CM | POA: Diagnosis not present

## 2016-06-05 ENCOUNTER — Ambulatory Visit (HOSPITAL_COMMUNITY): Payer: Medicare Other

## 2016-06-10 ENCOUNTER — Encounter (HOSPITAL_COMMUNITY): Payer: Self-pay | Admitting: *Deleted

## 2016-06-10 ENCOUNTER — Emergency Department (HOSPITAL_COMMUNITY): Payer: Medicare Other

## 2016-06-10 ENCOUNTER — Emergency Department (HOSPITAL_COMMUNITY)
Admission: EM | Admit: 2016-06-10 | Discharge: 2016-06-10 | Disposition: A | Discharge status: Home / Self Care | Payer: Medicare Other | Attending: Emergency Medicine | Admitting: Emergency Medicine

## 2016-06-10 DIAGNOSIS — Y929 Unspecified place or not applicable: Secondary | ICD-10-CM | POA: Insufficient documentation

## 2016-06-10 DIAGNOSIS — Y999 Unspecified external cause status: Secondary | ICD-10-CM | POA: Insufficient documentation

## 2016-06-10 DIAGNOSIS — Z79899 Other long term (current) drug therapy: Secondary | ICD-10-CM | POA: Diagnosis not present

## 2016-06-10 DIAGNOSIS — F17210 Nicotine dependence, cigarettes, uncomplicated: Secondary | ICD-10-CM | POA: Insufficient documentation

## 2016-06-10 DIAGNOSIS — S0993XA Unspecified injury of face, initial encounter: Secondary | ICD-10-CM | POA: Diagnosis present

## 2016-06-10 DIAGNOSIS — Y9389 Activity, other specified: Secondary | ICD-10-CM | POA: Diagnosis not present

## 2016-06-10 DIAGNOSIS — I10 Essential (primary) hypertension: Secondary | ICD-10-CM | POA: Insufficient documentation

## 2016-06-10 DIAGNOSIS — J449 Chronic obstructive pulmonary disease, unspecified: Secondary | ICD-10-CM | POA: Insufficient documentation

## 2016-06-10 DIAGNOSIS — Z7982 Long term (current) use of aspirin: Secondary | ICD-10-CM | POA: Diagnosis not present

## 2016-06-10 DIAGNOSIS — S0083XA Contusion of other part of head, initial encounter: Secondary | ICD-10-CM | POA: Insufficient documentation

## 2016-06-10 DIAGNOSIS — J45909 Unspecified asthma, uncomplicated: Secondary | ICD-10-CM | POA: Diagnosis not present

## 2016-06-10 MED ORDER — ACETAMINOPHEN 325 MG PO TABS
650.0000 mg | ORAL_TABLET | Freq: Once | ORAL | Status: AC
  Administered 2016-06-10: 650 mg via ORAL
  Filled 2016-06-10: qty 2

## 2016-06-10 MED ORDER — TRAMADOL HCL 50 MG PO TABS: 50.0000 mg | ORAL_TABLET | Freq: Four times a day (QID) | ORAL | 0 refills | Status: DC | PRN

## 2016-06-10 NOTE — Discharge Instructions (Signed)
Apply ice packs on/off to your face to help reduce swelling.  Follow-up with your primary doctor for recheck.  Return here for any worsening symtpoms
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
## 2016-06-11 ENCOUNTER — Ambulatory Visit: Payer: Medicare Other | Admitting: Podiatry
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
## 2016-06-23 ENCOUNTER — Encounter (HOSPITAL_COMMUNITY): Payer: Self-pay

## 2016-06-23 ENCOUNTER — Emergency Department (HOSPITAL_COMMUNITY)
Admission: EM | Admit: 2016-06-23 | Discharge: 2016-06-23 | Disposition: A | Discharge status: Home / Self Care | Payer: Medicare Other | Attending: Emergency Medicine | Admitting: Emergency Medicine

## 2016-06-23 DIAGNOSIS — Y999 Unspecified external cause status: Secondary | ICD-10-CM | POA: Insufficient documentation

## 2016-06-23 DIAGNOSIS — F17210 Nicotine dependence, cigarettes, uncomplicated: Secondary | ICD-10-CM | POA: Insufficient documentation

## 2016-06-23 DIAGNOSIS — Z7982 Long term (current) use of aspirin: Secondary | ICD-10-CM | POA: Insufficient documentation

## 2016-06-23 DIAGNOSIS — Y939 Activity, unspecified: Secondary | ICD-10-CM | POA: Diagnosis not present

## 2016-06-23 DIAGNOSIS — Z79899 Other long term (current) drug therapy: Secondary | ICD-10-CM | POA: Insufficient documentation

## 2016-06-23 DIAGNOSIS — W19XXXA Unspecified fall, initial encounter: Secondary | ICD-10-CM | POA: Insufficient documentation

## 2016-06-23 DIAGNOSIS — M791 Myalgia: Secondary | ICD-10-CM | POA: Diagnosis not present

## 2016-06-23 DIAGNOSIS — S0993XA Unspecified injury of face, initial encounter: Secondary | ICD-10-CM | POA: Diagnosis present

## 2016-06-23 DIAGNOSIS — S0083XA Contusion of other part of head, initial encounter: Secondary | ICD-10-CM | POA: Diagnosis not present

## 2016-06-23 DIAGNOSIS — J449 Chronic obstructive pulmonary disease, unspecified: Secondary | ICD-10-CM | POA: Insufficient documentation

## 2016-06-23 DIAGNOSIS — Y929 Unspecified place or not applicable: Secondary | ICD-10-CM | POA: Diagnosis not present

## 2016-06-23 DIAGNOSIS — J45909 Unspecified asthma, uncomplicated: Secondary | ICD-10-CM | POA: Diagnosis not present

## 2016-06-23 DIAGNOSIS — I10 Essential (primary) hypertension: Secondary | ICD-10-CM | POA: Diagnosis not present

## 2016-06-23 MED ORDER — POVIDONE-IODINE 10 % EX SOLN
CUTANEOUS | Status: AC
  Filled 2016-06-23: qty 118

## 2016-06-23 MED ORDER — LIDOCAINE-EPINEPHRINE (PF) 2 %-1:200000 IJ SOLN
10.0000 mL | Freq: Once | INTRAMUSCULAR | Status: AC
  Administered 2016-06-23: 10 mL via INTRADERMAL
  Filled 2016-06-23: qty 20

## 2016-06-23 NOTE — Discharge Instructions (Signed)
If you were given medicines take as directed.  If you are on coumadin or contraceptives realize their levels and effectiveness is altered by many different medicines.  If you have any reaction (rash, tongues swelling, other) to the medicines stop taking and see a physician.    If your blood pressure was elevated in the ER make sure you follow up for management with a primary doctor or return for chest pain, shortness of breath or stroke symptoms.  Please follow up as directed and return to the ER or see a physician for new or worsening symptoms.  Thank you. Vitals:   06/23/16 1117  BP: (!) 153/80  Pulse: 61  Resp: 16  Temp: 97.7 F (36.5 C)  TempSrc: Oral  SpO2: 95%  Weight: 150 lb (68 kg)

## 2016-06-23 NOTE — ED Triage Notes (Signed)
Pt in for follow up due to contusion to right cheek. Seen 06/10/16. Area to right cheek remains swollen and firm to touch

## 2016-06-23 NOTE — ED Provider Notes (Signed)
[facility] DEPT Provider Note   CSN: [id number] Arrival date & time: 06/23/16  1058   By signing my name below, I, Avnee Patel, attest that this documentation has been prepared under the direction and in the presence of Elnora Morrison, MD  Electronically Signed: Delton Prairie, ED Scribe. 06/23/16. 11:59 AM.   History   Chief Complaint Chief Complaint  Patient presents with  . Follow-up    HPI Comments:  Karen Keller is a 61 y.o. female who presents to the Emergency Department complaining of continued swelling to her right cheek s/p a fall which occurred 2 weeks ago. She also reports associated pain and bruising to the area. Her pain is worse with palpation. No alleviating factors noted. Pt denies fevers, chills or any other associated symptoms. Pt also denies blood thinner use or a hx of abscesses. No other complaints noted at this time.   The history is provided by the patient. No language interpreter was used.    Past Medical History:  Diagnosis Date  . Anxiety   . Asthma   . Bipolar 1 disorder ([place])   . COPD (chronic obstructive pulmonary disease) ([place])   . Hypertension   . Manic episode, unspecified ([place])   . Muscle weakness (generalized)   . Pain   . Seizures ([place])    had 1 seizure "many years ago" etiology unknown and never on any meds for this.    Patient Active Problem List   Diagnosis Date Noted  . Bilateral lower extremity edema 01/01/2016  . Encounter for screening colonoscopy 01/01/2016  . Macrocytic anemia 01/01/2016    Past Surgical History:  Procedure Laterality Date  . APPENDECTOMY    . BACK SURGERY     cervical fusion with cage  . closed fracture of shaft of right tibia    . COLONOSCOPY WITH PROPOFOL N/A 01/27/2016   Procedure: COLONOSCOPY WITH PROPOFOL;  Surgeon: Daneil Dolin, MD;  [facility];  Service: Endoscopy;  Laterality: N/A;  1100  . WRIST SURGERY Right     OB History    No data available       Home  Medications    Prior to Admission medications   Medication Sig Start Date End Date Taking? Authorizing Provider  acetaminophen (TYLENOL) 325 MG tablet Take 975 mg by mouth every 8 (eight) hours.    Yes Historical Provider, MD  albuterol (PROAIR HFA) 108 (90 Base) MCG/ACT inhaler Inhale 2 puffs into the lungs every 4 (four) hours as needed for wheezing or shortness of breath.   Yes Historical Provider, MD  aspirin 325 MG EC tablet Take 325 mg by mouth 2 (two) times daily.   Yes Historical Provider, MD  B Complex-Biotin-FA (B-COMPLEX PO) Take 1 tablet by mouth daily.   Yes Historical Provider, MD  buPROPion (WELLBUTRIN XL) 300 MG 24 hr tablet Take 300 mg by mouth daily.   Yes Historical Provider, MD  calcium-vitamin D (OSCAL WITH D) 500-200 MG-UNIT tablet Take 1 tablet by mouth 2 (two) times daily with a meal.   Yes Historical Provider, MD  carbidopa-levodopa (SINEMET IR) 10-100 MG tablet Take 1 tablet by mouth 3 (three) times daily.   Yes Historical Provider, MD  divalproex (DEPAKOTE SPRINKLES) 125 MG capsule Take 500 mg by mouth 2 (two) times daily.   Yes Historical Provider, MD  docusate sodium (COLACE) 100 MG capsule Take 100 mg by mouth daily.   Yes Historical Provider, MD  haloperidol (HALDOL) 0.5 MG tablet Take 0.5 mg by  mouth 2 (two) times daily.   Yes Historical Provider, MD  hydrALAZINE (APRESOLINE) 25 MG tablet Take 25 mg by mouth 4 (four) times daily.   Yes Historical Provider, MD  hydrOXYzine (ATARAX/VISTARIL) 25 MG tablet Take 25 mg by mouth at bedtime as needed (sleep).   Yes Historical Provider, MD  lithium 300 MG tablet Take 300 mg by mouth 2 (two) times daily.   Yes Historical Provider, MD  LORazepam (ATIVAN) 0.5 MG tablet Take 0.25 mg by mouth every 12 (twelve) hours as needed for anxiety.    Yes Historical Provider, MD  Multiple Vitamin (MULTIVITAMIN WITH MINERALS) TABS tablet Take 1 tablet by mouth daily.   Yes Historical Provider, MD  naproxen (NAPROSYN) 500 MG tablet Take 500  mg by mouth 2 (two) times daily as needed for mild pain ((chest pain)).   Yes Historical Provider, MD  oxybutynin (DITROPAN-XL) 10 MG 24 hr tablet Take 10 mg by mouth daily.   Yes Historical Provider, MD  PARoxetine (PAXIL) 10 MG tablet Take 10 mg by mouth at bedtime. Takes 10 mg and 40 mg= 50 mg   Yes Historical Provider, MD  PARoxetine (PAXIL) 30 MG tablet Take 30 mg by mouth every morning.   Yes Historical Provider, MD  polyethylene glycol (MIRALAX / GLYCOLAX) packet Take 17 g by mouth daily.   Yes Historical Provider, MD  senna-docusate (SENNA-PLUS) 8.6-50 MG tablet Take 1 tablet by mouth daily as needed for mild constipation.   Yes Historical Provider, MD  traMADol (ULTRAM) 50 MG tablet Take 1 tablet (50 mg total) by mouth every 6 (six) hours as needed. 06/10/16  Yes Tammy Triplett, PA-C    Family History Family History  Problem Relation Age of Onset  . Heart disease Father   . Colon cancer Neg Hx     Social History Social History  Substance Use Topics  . Smoking status: Current Every Day Smoker    Packs/day: 0.50    Years: 40.00    Types: Cigarettes  . Smokeless tobacco: Never Used  . Alcohol use No     Allergies   Neurontin [gabapentin] and Septra ds [sulfamethoxazole-trimethoprim]   Review of Systems Review of Systems  Constitutional: Negative for chills and fever.  HENT: Positive for facial swelling.   Musculoskeletal: Positive for myalgias.  Skin: Positive for color change.  All other systems reviewed and are negative.    Physical Exam Updated Vital Signs BP (!) 153/80 (BP Location: Right Arm)   Pulse 61   Temp 97.7 F (36.5 C) (Oral)   Resp 16   Wt 150 lb (68 kg)   SpO2 95%   BMI 22.81 kg/m   Physical Exam  Constitutional: She is oriented to person, place, and time. She appears well-developed and well-nourished. No distress.  HENT:  Head: Normocephalic and atraumatic.  Periorbital ecchymosis most significant in right medial maxilla. Significant  swelling on the right lateral maxilla region. Mild TTP. Fluid filled.   Eyes: Conjunctivae and EOM are normal.  Cardiovascular: Normal rate.   Pulmonary/Chest: Effort normal.  Abdominal: She exhibits no distension.  Neurological: She is alert and oriented to person, place, and time.  Skin: Skin is warm and dry.  Psychiatric: She has a normal mood and affect.  Nursing note and vitals reviewed.    ED Treatments / Results  DIAGNOSTIC STUDIES:  Oxygen Saturation is 95% on RA, normal by my interpretation.    COORDINATION OF CARE:  11:50 AM Discussed treatment plan with pt at bedside and  pt agreed to plan.  Labs (all labs ordered are listed, but only abnormal results are displayed) Labs Reviewed - No data to display  EKG  EKG Interpretation None       Radiology No results found.  Procedures Procedures (including critical care time) EMERGENCY DEPARTMENT US SOFT TISSUE INTERPRETATION "Study: Limited Soft Tissue Ultrasound"  INDICATIONS: Pain Multiple views of the body part were obtained in real-time with a multi-frequency linear probe  PERFORMED BY: Myself IMAGES ARCHIVED?: Yes SIDE:Right  BODY PART:face INTERPRETATION:  hematoma 3 cm diameter  Hematoma drainage Discussed risk and benefits Lidocaine used to anesthetize the skin of right face Area cleaned prior 16-gauge needle used to drain 5 mL of hematoma. Performed by myself patient tolerated well. Medications Ordered in ED Medications  lidocaine-EPINEPHrine (XYLOCAINE W/EPI) 2 %-1:200000 (PF) injection 10 mL (not administered)  povidone-iodine (BETADINE) 10 % external solution (not administered)     Initial Impression / Assessment and Plan / ED Course  I have reviewed the triage vital signs and the nursing notes.  Pertinent labs & imaging results that were available during my care of the patient were reviewed by me and considered in my medical decision making (see chart for details).    11:44 AM  Discussed risks regarding procedure with pt. Pt understands there is a chance of pain, structural damage or even infection.   Discussed risks and benefits of needle drainage of hematoma. Patient agrees.  Patient tolerated drainage of 5 mL of hematoma and felt significantly improved.  Results and differential diagnosis were discussed with the patient/parent/guardian. Xrays were independently reviewed by myself.  Close follow up outpatient was discussed, comfortable with the plan.   Medications  lidocaine-EPINEPHrine (XYLOCAINE W/EPI) 2 %-1:200000 (PF) injection 10 mL (not administered)  povidone-iodine (BETADINE) 10 % external solution (not administered)    Vitals:   06/23/16 1117  BP: (!) 153/80  Pulse: 61  Resp: 16  Temp: 97.7 F (36.5 C)  TempSrc: Oral  SpO2: 95%  Weight: 150 lb (68 kg)    Final diagnoses:  Facial hematoma, initial encounter     Final Clinical Impressions(s) / ED Diagnoses   Final diagnoses:  Facial hematoma, initial encounter    New Prescriptions New Prescriptions   No medications on file      Elnora Morrison, MD 06/23/16 1255

## 2016-06-24 ENCOUNTER — Encounter: Payer: Self-pay | Admitting: Podiatry

## 2016-06-24 ENCOUNTER — Ambulatory Visit (INDEPENDENT_AMBULATORY_CARE_PROVIDER_SITE_OTHER): Payer: Medicare Other | Admitting: Podiatry

## 2016-06-24 DIAGNOSIS — B351 Tinea unguium: Secondary | ICD-10-CM

## 2016-06-24 DIAGNOSIS — M79676 Pain in unspecified toe(s): Secondary | ICD-10-CM

## 2016-06-24 NOTE — Progress Notes (Addendum)
Complaint:  Visit Type: Patient returns to my office for continued preventative foot care services. Complaint: Patient states" my nails have grown long and thick and become painful to walk and wear shoes" . The patient presents for preventative foot care services. No changes to ROS  Podiatric Exam: Vascular: dorsalis pedis  are palpable bilateral. Posterior tibial pulses are not palpable. Capillary return is immediate. Temperature gradient is WNL. Skin turgor WNL  Sensorium: Normal Semmes Weinstein monofilament test. Normal tactile sensation bilaterally. Nail Exam: Pt has thick disfigured discolored nails with subungual debris noted bilateral entire nail hallux through fifth toenails Ulcer Exam: There is no evidence of ulcer or pre-ulcerative changes or infection. Orthopedic Exam: Muscle tone and strength are WNL. No limitations in general ROM. No crepitus or effusions noted. Hammer toe second left. Bony prominences are unremarkable. Skin: No Porokeratosis. No infection or ulcers  Diagnosis:  Onychomycosis, , Pain in right toe, pain in left toes  Treatment & Plan Procedures and Treatment: Consent by patient was obtained for treatment procedures. The patient understood the discussion of treatment and procedures well. All questions were answered thoroughly reviewed. Debridement of mycotic and hypertrophic toenails, 1 through 5 bilateral and clearing of subungual debris. No ulceration, no infection noted.  Return Visit-Office Procedure: Patient instructed to return to the office for a follow up visit 3 months for continued evaluation and treatment.    Gardiner Barefoot DPM

## 2016-07-08 DIAGNOSIS — F319 Bipolar disorder, unspecified: Secondary | ICD-10-CM | POA: Diagnosis not present

## 2016-07-10 DIAGNOSIS — F319 Bipolar disorder, unspecified: Secondary | ICD-10-CM | POA: Diagnosis not present

## 2016-07-10 DIAGNOSIS — Z79899 Other long term (current) drug therapy: Secondary | ICD-10-CM | POA: Diagnosis not present

## 2016-07-22 DIAGNOSIS — F319 Bipolar disorder, unspecified: Secondary | ICD-10-CM | POA: Diagnosis not present

## 2016-08-05 ENCOUNTER — Emergency Department (HOSPITAL_COMMUNITY): Payer: Medicare Other

## 2016-08-05 ENCOUNTER — Emergency Department (HOSPITAL_COMMUNITY)
Admission: EM | Admit: 2016-08-05 | Discharge: 2016-08-05 | Disposition: A | Discharge status: Home / Self Care | Payer: Medicare Other | Attending: Emergency Medicine | Admitting: Emergency Medicine

## 2016-08-05 ENCOUNTER — Encounter (HOSPITAL_COMMUNITY): Payer: Self-pay | Admitting: Emergency Medicine

## 2016-08-05 DIAGNOSIS — R410 Disorientation, unspecified: Secondary | ICD-10-CM | POA: Diagnosis not present

## 2016-08-05 DIAGNOSIS — W1839XA Other fall on same level, initial encounter: Secondary | ICD-10-CM | POA: Insufficient documentation

## 2016-08-05 DIAGNOSIS — M25561 Pain in right knee: Secondary | ICD-10-CM | POA: Insufficient documentation

## 2016-08-05 DIAGNOSIS — Y929 Unspecified place or not applicable: Secondary | ICD-10-CM | POA: Insufficient documentation

## 2016-08-05 DIAGNOSIS — Z7982 Long term (current) use of aspirin: Secondary | ICD-10-CM | POA: Diagnosis not present

## 2016-08-05 DIAGNOSIS — Y9301 Activity, walking, marching and hiking: Secondary | ICD-10-CM | POA: Insufficient documentation

## 2016-08-05 DIAGNOSIS — F17210 Nicotine dependence, cigarettes, uncomplicated: Secondary | ICD-10-CM | POA: Insufficient documentation

## 2016-08-05 DIAGNOSIS — J449 Chronic obstructive pulmonary disease, unspecified: Secondary | ICD-10-CM | POA: Diagnosis not present

## 2016-08-05 DIAGNOSIS — M25551 Pain in right hip: Secondary | ICD-10-CM | POA: Diagnosis not present

## 2016-08-05 DIAGNOSIS — I10 Essential (primary) hypertension: Secondary | ICD-10-CM | POA: Insufficient documentation

## 2016-08-05 DIAGNOSIS — S2241XA Multiple fractures of ribs, right side, initial encounter for closed fracture: Secondary | ICD-10-CM | POA: Insufficient documentation

## 2016-08-05 DIAGNOSIS — R0781 Pleurodynia: Secondary | ICD-10-CM | POA: Diagnosis not present

## 2016-08-05 DIAGNOSIS — S299XXA Unspecified injury of thorax, initial encounter: Secondary | ICD-10-CM | POA: Diagnosis not present

## 2016-08-05 DIAGNOSIS — S79911A Unspecified injury of right hip, initial encounter: Secondary | ICD-10-CM | POA: Diagnosis not present

## 2016-08-05 DIAGNOSIS — J45909 Unspecified asthma, uncomplicated: Secondary | ICD-10-CM | POA: Insufficient documentation

## 2016-08-05 DIAGNOSIS — Y999 Unspecified external cause status: Secondary | ICD-10-CM | POA: Insufficient documentation

## 2016-08-05 DIAGNOSIS — Z79899 Other long term (current) drug therapy: Secondary | ICD-10-CM | POA: Insufficient documentation

## 2016-08-05 DIAGNOSIS — M25559 Pain in unspecified hip: Secondary | ICD-10-CM

## 2016-08-05 LAB — URINALYSIS, ROUTINE W REFLEX MICROSCOPIC
BILIRUBIN URINE: NEGATIVE
Bacteria, UA: NONE SEEN
GLUCOSE, UA: NEGATIVE mg/dL
Hgb urine dipstick: NEGATIVE
KETONES UR: NEGATIVE mg/dL
NITRITE: NEGATIVE
PH: 6 (ref 5.0–8.0)
Protein, ur: NEGATIVE mg/dL
Specific Gravity, Urine: 1.012 (ref 1.005–1.030)

## 2016-08-05 LAB — CBC WITH DIFFERENTIAL/PLATELET
Basophils Absolute: 0 10*3/uL (ref 0.0–0.1)
Basophils Relative: 1 %
EOS PCT: 3 %
Eosinophils Absolute: 0.2 10*3/uL (ref 0.0–0.7)
HCT: 35.2 % — ABNORMAL LOW (ref 36.0–46.0)
Hemoglobin: 11.5 g/dL — ABNORMAL LOW (ref 12.0–15.0)
LYMPHS ABS: 1.7 10*3/uL (ref 0.7–4.0)
LYMPHS PCT: 30 %
MCH: 35.2 pg — ABNORMAL HIGH (ref 26.0–34.0)
MCHC: 32.7 g/dL (ref 30.0–36.0)
MCV: 107.6 fL — AB (ref 78.0–100.0)
MONO ABS: 0.8 10*3/uL (ref 0.1–1.0)
Monocytes Relative: 15 %
Neutro Abs: 2.8 10*3/uL (ref 1.7–7.7)
Neutrophils Relative %: 51 %
PLATELETS: 216 10*3/uL (ref 150–400)
RBC: 3.27 MIL/uL — ABNORMAL LOW (ref 3.87–5.11)
RDW: 12.4 % (ref 11.5–15.5)
WBC: 5.5 10*3/uL (ref 4.0–10.5)

## 2016-08-05 LAB — COMPREHENSIVE METABOLIC PANEL
ALT: 7 U/L — ABNORMAL LOW (ref 14–54)
ANION GAP: 7 (ref 5–15)
AST: 37 U/L (ref 15–41)
Albumin: 3.7 g/dL (ref 3.5–5.0)
Alkaline Phosphatase: 80 U/L (ref 38–126)
BUN: 17 mg/dL (ref 6–20)
CHLORIDE: 104 mmol/L (ref 101–111)
CO2: 25 mmol/L (ref 22–32)
Calcium: 10.3 mg/dL (ref 8.9–10.3)
Creatinine, Ser: 1.22 mg/dL — ABNORMAL HIGH (ref 0.44–1.00)
GFR calc Af Amer: 54 mL/min — ABNORMAL LOW (ref >60)
GFR, EST NON AFRICAN AMERICAN: 46 mL/min — AB (ref >60)
Glucose, Bld: 99 mg/dL (ref 65–99)
POTASSIUM: 4.1 mmol/L (ref 3.5–5.1)
Sodium: 136 mmol/L (ref 135–145)
Total Bilirubin: 0.6 mg/dL (ref 0.3–1.2)
Total Protein: 6.4 g/dL — ABNORMAL LOW (ref 6.5–8.1)

## 2016-08-05 LAB — LITHIUM LEVEL: LITHIUM LVL: 0.87 mmol/L (ref 0.60–1.20)
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
## 2016-08-05 NOTE — ED Triage Notes (Addendum)
Per [facility] facility staff, pt has had increased confusion and recent falls. Pt reports right arm pain from fall. Facility reports pt followed up with mental health yesterday and denies loc or hitting head at time of fall on 08/04/16. Pt alert and oriented to self, time, and place.
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
## 2016-08-06 DIAGNOSIS — Z23 Encounter for immunization: Secondary | ICD-10-CM | POA: Diagnosis not present

## 2016-08-11 DIAGNOSIS — F319 Bipolar disorder, unspecified: Secondary | ICD-10-CM | POA: Diagnosis not present

## 2016-08-13 DIAGNOSIS — F319 Bipolar disorder, unspecified: Secondary | ICD-10-CM | POA: Diagnosis not present

## 2016-08-17 DIAGNOSIS — F319 Bipolar disorder, unspecified: Secondary | ICD-10-CM | POA: Diagnosis not present

## 2016-08-17 DIAGNOSIS — J449 Chronic obstructive pulmonary disease, unspecified: Secondary | ICD-10-CM | POA: Diagnosis not present

## 2016-08-17 DIAGNOSIS — R2681 Unsteadiness on feet: Secondary | ICD-10-CM | POA: Diagnosis not present

## 2016-08-17 DIAGNOSIS — R296 Repeated falls: Secondary | ICD-10-CM | POA: Diagnosis not present

## 2016-08-18 DIAGNOSIS — S8392XD Sprain of unspecified site of left knee, subsequent encounter: Secondary | ICD-10-CM | POA: Diagnosis not present

## 2016-08-18 DIAGNOSIS — M24562 Contracture, left knee: Secondary | ICD-10-CM | POA: Diagnosis not present

## 2016-08-20 DIAGNOSIS — M24562 Contracture, left knee: Secondary | ICD-10-CM | POA: Diagnosis not present

## 2016-08-20 DIAGNOSIS — S8392XD Sprain of unspecified site of left knee, subsequent encounter: Secondary | ICD-10-CM | POA: Diagnosis not present

## 2016-08-24 DIAGNOSIS — S8392XD Sprain of unspecified site of left knee, subsequent encounter: Secondary | ICD-10-CM | POA: Diagnosis not present

## 2016-08-24 DIAGNOSIS — J449 Chronic obstructive pulmonary disease, unspecified: Secondary | ICD-10-CM | POA: Diagnosis not present

## 2016-08-24 DIAGNOSIS — F319 Bipolar disorder, unspecified: Secondary | ICD-10-CM | POA: Diagnosis not present

## 2016-08-24 DIAGNOSIS — R2681 Unsteadiness on feet: Secondary | ICD-10-CM | POA: Diagnosis not present

## 2016-08-24 DIAGNOSIS — M24562 Contracture, left knee: Secondary | ICD-10-CM | POA: Diagnosis not present

## 2016-08-25 DIAGNOSIS — M24562 Contracture, left knee: Secondary | ICD-10-CM | POA: Diagnosis not present

## 2016-08-25 DIAGNOSIS — S8392XD Sprain of unspecified site of left knee, subsequent encounter: Secondary | ICD-10-CM | POA: Diagnosis not present

## 2016-08-26 DIAGNOSIS — S8392XD Sprain of unspecified site of left knee, subsequent encounter: Secondary | ICD-10-CM | POA: Diagnosis not present

## 2016-08-26 DIAGNOSIS — M24562 Contracture, left knee: Secondary | ICD-10-CM | POA: Diagnosis not present

## 2016-08-28 DIAGNOSIS — M24562 Contracture, left knee: Secondary | ICD-10-CM | POA: Diagnosis not present

## 2016-08-28 DIAGNOSIS — S8392XD Sprain of unspecified site of left knee, subsequent encounter: Secondary | ICD-10-CM | POA: Diagnosis not present

## 2016-09-01 DIAGNOSIS — G2111 Neuroleptic induced parkinsonism: Secondary | ICD-10-CM | POA: Diagnosis not present

## 2016-09-01 DIAGNOSIS — G20 Parkinson's disease: Secondary | ICD-10-CM | POA: Diagnosis not present

## 2016-09-01 DIAGNOSIS — M24562 Contracture, left knee: Secondary | ICD-10-CM | POA: Diagnosis not present

## 2016-09-01 DIAGNOSIS — M79601 Pain in right arm: Secondary | ICD-10-CM | POA: Diagnosis not present

## 2016-09-01 DIAGNOSIS — S8392XD Sprain of unspecified site of left knee, subsequent encounter: Secondary | ICD-10-CM | POA: Diagnosis not present

## 2016-09-01 DIAGNOSIS — R269 Unspecified abnormalities of gait and mobility: Secondary | ICD-10-CM | POA: Diagnosis not present

## 2016-09-01 DIAGNOSIS — Z79899 Other long term (current) drug therapy: Secondary | ICD-10-CM | POA: Diagnosis not present

## 2016-09-03 DIAGNOSIS — M24562 Contracture, left knee: Secondary | ICD-10-CM | POA: Diagnosis not present

## 2016-09-03 DIAGNOSIS — S8392XD Sprain of unspecified site of left knee, subsequent encounter: Secondary | ICD-10-CM | POA: Diagnosis not present

## 2016-09-04 ENCOUNTER — Emergency Department (HOSPITAL_COMMUNITY): Payer: Medicare Other

## 2016-09-04 ENCOUNTER — Emergency Department (HOSPITAL_COMMUNITY)
Admission: EM | Admit: 2016-09-04 | Discharge: 2016-09-04 | Disposition: A | Discharge status: Home / Self Care | Payer: Medicare Other | Attending: Emergency Medicine | Admitting: Emergency Medicine

## 2016-09-04 ENCOUNTER — Encounter (HOSPITAL_COMMUNITY): Payer: Self-pay | Admitting: Emergency Medicine

## 2016-09-04 DIAGNOSIS — Z79899 Other long term (current) drug therapy: Secondary | ICD-10-CM | POA: Insufficient documentation

## 2016-09-04 DIAGNOSIS — J45909 Unspecified asthma, uncomplicated: Secondary | ICD-10-CM | POA: Diagnosis not present

## 2016-09-04 DIAGNOSIS — Z7982 Long term (current) use of aspirin: Secondary | ICD-10-CM | POA: Diagnosis not present

## 2016-09-04 DIAGNOSIS — R531 Weakness: Secondary | ICD-10-CM

## 2016-09-04 DIAGNOSIS — R260 Ataxic gait: Secondary | ICD-10-CM | POA: Diagnosis not present

## 2016-09-04 DIAGNOSIS — F17210 Nicotine dependence, cigarettes, uncomplicated: Secondary | ICD-10-CM | POA: Diagnosis not present

## 2016-09-04 DIAGNOSIS — I10 Essential (primary) hypertension: Secondary | ICD-10-CM | POA: Diagnosis not present

## 2016-09-04 DIAGNOSIS — J449 Chronic obstructive pulmonary disease, unspecified: Secondary | ICD-10-CM | POA: Insufficient documentation

## 2016-09-04 DIAGNOSIS — M6281 Muscle weakness (generalized): Secondary | ICD-10-CM | POA: Diagnosis not present

## 2016-09-04 DIAGNOSIS — F319 Bipolar disorder, unspecified: Secondary | ICD-10-CM | POA: Diagnosis not present

## 2016-09-04 HISTORY — DX: Constipation, unspecified: K59.00

## 2016-09-04 LAB — COMPREHENSIVE METABOLIC PANEL
ALK PHOS: 96 U/L (ref 38–126)
ALT: 36 U/L (ref 14–54)
AST: 40 U/L (ref 15–41)
Albumin: 4 g/dL (ref 3.5–5.0)
Anion gap: 8 (ref 5–15)
BUN: 22 mg/dL — AB (ref 6–20)
CALCIUM: 10.3 mg/dL (ref 8.9–10.3)
CO2: 25 mmol/L (ref 22–32)
CREATININE: 1.09 mg/dL — AB (ref 0.44–1.00)
Chloride: 104 mmol/L (ref 101–111)
GFR calc non Af Amer: 53 mL/min — ABNORMAL LOW (ref >60)
GLUCOSE: 92 mg/dL (ref 65–99)
Potassium: 4.2 mmol/L (ref 3.5–5.1)
SODIUM: 137 mmol/L (ref 135–145)
Total Bilirubin: 0.4 mg/dL (ref 0.3–1.2)
Total Protein: 7.1 g/dL (ref 6.5–8.1)

## 2016-09-04 LAB — CBC WITH DIFFERENTIAL/PLATELET
Basophils Absolute: 0.1 10*3/uL (ref 0.0–0.1)
Basophils Relative: 1 %
EOS ABS: 0.3 10*3/uL (ref 0.0–0.7)
Eosinophils Relative: 3 %
HCT: 39 % (ref 36.0–46.0)
HEMOGLOBIN: 12.5 g/dL (ref 12.0–15.0)
LYMPHS ABS: 2 10*3/uL (ref 0.7–4.0)
LYMPHS PCT: 27 %
MCH: 34.5 pg — AB (ref 26.0–34.0)
MCHC: 32.1 g/dL (ref 30.0–36.0)
MCV: 107.7 fL — ABNORMAL HIGH (ref 78.0–100.0)
Monocytes Absolute: 0.9 10*3/uL (ref 0.1–1.0)
Monocytes Relative: 12 %
NEUTROS PCT: 57 %
Neutro Abs: 4.1 10*3/uL (ref 1.7–7.7)
Platelets: 259 10*3/uL (ref 150–400)
RBC: 3.62 MIL/uL — AB (ref 3.87–5.11)
RDW: 13.6 % (ref 11.5–15.5)
WBC: 7.3 10*3/uL (ref 4.0–10.5)

## 2016-09-04 LAB — LITHIUM LEVEL: Lithium Lvl: 1.04 mmol/L (ref 0.60–1.20)

## 2016-09-04 NOTE — ED Triage Notes (Signed)
Per [facility] pt was at [facility] day center today and was told to come to ED d/t generalized weakness, tremors and slow ambulation. PT states she was seen at Dr. Freddie Apley office on 09/01/16 and had new orders to wean off Sinemet and Ariprazole medication. PT came with paper from her psychologist and it is recommending a need for a head CT today.

## 2016-09-04 NOTE — Discharge Instructions (Signed)
Follow up dr. Freddie Apley advice and return to him as planned.  Your CT did not show any stroke or tumors

## 2016-09-04 NOTE — ED Notes (Signed)
Patient transported to CT 

## 2016-09-05 NOTE — ED Provider Notes (Signed)
[facility] DEPT Provider Note   CSN: [id number] Arrival date & time: 09/04/16  1356     History   Chief Complaint Chief Complaint  Patient presents with  . Weakness    HPI Karen Keller is a 61 y.o. female.  Patient states that she's been getting weaker for over a month she feels ataxic. She saw her neurologist yesterday and her urologist made changes in her medicine to try to help with her Parkinson's disease. Patient wanted to make sure she did not have a stroke so came to the emergency department today   The history is provided by the patient. No language interpreter was used.  Weakness  Primary symptoms include loss of balance. This is a chronic problem. The current episode started more than 2 days ago. The problem has been gradually worsening. There was no focality noted. There has been no fever. Pertinent negatives include no chest pain and no headaches. Associated medical issues do not include trauma.    Past Medical History:  Diagnosis Date  . Anxiety   . Asthma   . Bipolar 1 disorder ([place])   . Constipation   . COPD (chronic obstructive pulmonary disease) ([place])   . Hypertension   . Manic episode, unspecified ([place])   . Muscle weakness (generalized)   . Pain   . Seizures ([place])    had 1 seizure "many years ago" etiology unknown and never on any meds for this.    Patient Active Problem List   Diagnosis Date Noted  . Bilateral lower extremity edema 01/01/2016  . Encounter for screening colonoscopy 01/01/2016  . Macrocytic anemia 01/01/2016    Past Surgical History:  Procedure Laterality Date  . APPENDECTOMY    . BACK SURGERY     cervical fusion with cage  . closed fracture of shaft of right tibia    . COLONOSCOPY WITH PROPOFOL N/A 01/27/2016   Procedure: COLONOSCOPY WITH PROPOFOL;  Surgeon: Daneil Dolin, MD;  [facility];  Service: Endoscopy;  Laterality: N/A;  1100  . WRIST SURGERY Right     OB History    No data available        Home Medications    Prior to Admission medications   Medication Sig Start Date End Date Taking? Authorizing Provider  acetaminophen (TYLENOL) 325 MG tablet Take 975 mg by mouth every 8 (eight) hours.    Yes [provider]  albuterol (PROAIR HFA) 108 (90 Base) MCG/ACT inhaler Inhale 2 puffs into the lungs every 4 (four) hours as needed for wheezing or shortness of breath.   Yes [provider]  ARIPiprazole (ABILIFY) 5 MG tablet Take 1 tablet by mouth at bedtime. 08/04/16  Yes [provider]  aspirin 325 MG EC tablet Take 325 mg by mouth 2 (two) times daily.   Yes [provider]  B Complex-Biotin-FA (B-COMPLEX PO) Take 1 tablet by mouth daily.   Yes [provider]  benztropine (COGENTIN) 0.5 MG tablet Take 1 tablet by mouth at bedtime. 08/04/16  Yes [provider]  buPROPion (WELLBUTRIN XL) 300 MG 24 hr tablet Take 300 mg by mouth daily.   Yes [provider]  calcium-vitamin D (OSCAL WITH D) 500-200 MG-UNIT tablet Take 1 tablet by mouth 2 (two) times daily with a meal.   Yes [provider]  carbidopa-levodopa (SINEMET IR) 10-100 MG tablet Take 1 tablet by mouth daily.    Yes [provider]  divalproex (DEPAKOTE SPRINKLES) 125 MG capsule Take 500  mg by mouth 2 (two) times daily.   Yes [provider]  docusate sodium (COLACE) 100 MG capsule Take 100 mg by mouth daily.   Yes [provider]  hydrALAZINE (APRESOLINE) 25 MG tablet Take 25 mg by mouth 4 (four) times daily.   Yes [provider]  lithium 300 MG tablet Take 300 mg by mouth 2 (two) times daily.   Yes [provider]  LORazepam (ATIVAN) 0.5 MG tablet Take 0.25 mg by mouth every 12 (twelve) hours as needed for anxiety.    Yes [provider]  Multiple Vitamin (MULTIVITAMIN WITH MINERALS) TABS tablet Take 1 tablet by mouth daily.   Yes [provider]  naproxen (NAPROSYN) 500 MG tablet Take 500  mg by mouth 2 (two) times daily as needed for mild pain ((chest pain)).   Yes [provider]  oxybutynin (DITROPAN-XL) 10 MG 24 hr tablet Take 10 mg by mouth daily.   Yes [provider]  PARoxetine (PAXIL) 10 MG tablet Take 10 mg by mouth at bedtime.    Yes [provider]  PARoxetine (PAXIL) 30 MG tablet Take 30 mg by mouth every morning.   Yes [provider]  polyethylene glycol (MIRALAX / GLYCOLAX) packet Take 17 g by mouth daily.   Yes [provider]  senna-docusate (SENNA-PLUS) 8.6-50 MG tablet Take 1 tablet by mouth daily as needed for mild constipation.   Yes [provider]  traMADol (ULTRAM) 50 MG tablet Take 1 tablet (50 mg total) by mouth every 6 (six) hours as needed. Patient taking differently: Take 50 mg by mouth 2 (two) times daily.  06/10/16  Yes Triplett, Tammy, PA-C    Family History Family History  Problem Relation Age of Onset  . Heart disease Father   . Colon cancer Neg Hx     Social History Social History  Substance Use Topics  . Smoking status: Current Every Day Smoker    Packs/day: 0.50    Years: 40.00    Types: Cigarettes  . Smokeless tobacco: Never Used  . Alcohol use No     Allergies   Neurontin [gabapentin] and Septra ds [sulfamethoxazole-trimethoprim]   Review of Systems Review of Systems  Constitutional: Negative for appetite change and fatigue.  HENT: Negative for congestion, ear discharge and sinus pressure.   Eyes: Negative for discharge.  Respiratory: Negative for cough.   Cardiovascular: Negative for chest pain.  Gastrointestinal: Negative for abdominal pain and diarrhea.  Genitourinary: Negative for frequency and hematuria.  Musculoskeletal: Negative for back pain.  Skin: Negative for rash.  Neurological: Positive for weakness and loss of balance. Negative for seizures and headaches.       Ataxia  Psychiatric/Behavioral: Negative for hallucinations.     Physical Exam Updated  Vital Signs BP (!) 143/75   Pulse (!) 59   Temp 98.1 F (36.7 C) (Oral)   Resp 16   Ht 5\' 8"  (1.727 m)   Wt 66.2 kg (146 lb)   SpO2 98%   BMI 22.20 kg/m   Physical Exam  Constitutional: She is oriented to person, place, and time. She appears well-developed.  Patient has mild general fatigue  HENT:  Head: Normocephalic.  Eyes: Conjunctivae and EOM are normal. No scleral icterus.  Neck: Neck supple. No thyromegaly present.  Cardiovascular: Normal rate and regular rhythm.  Exam reveals no gallop and no friction rub.   No murmur heard. Pulmonary/Chest: No stridor. She has no wheezes. She has no rales. She exhibits  no tenderness.  Abdominal: She exhibits no distension. There is no tenderness. There is no rebound.  Musculoskeletal: Normal range of motion. She exhibits no edema.  Patient has mild decrease coordination in arms and legs  Lymphadenopathy:    She has no cervical adenopathy.  Neurological: She is oriented to person, place, and time. She exhibits normal muscle tone. Coordination normal.  Skin: No rash noted. No erythema.  Psychiatric: She has a normal mood and affect. Her behavior is normal.     ED Treatments / Results  Labs (all labs ordered are listed, but only abnormal results are displayed) Labs Reviewed  CBC WITH DIFFERENTIAL/PLATELET - Abnormal; Notable for the following:       Result Value   RBC 3.62 (*)    MCV 107.7 (*)    MCH 34.5 (*)    All other components within normal limits  COMPREHENSIVE METABOLIC PANEL - Abnormal; Notable for the following:    BUN 22 (*)    Creatinine, Ser 1.09 (*)    GFR calc non Af Amer 53 (*)    All other components within normal limits  LITHIUM LEVEL    EKG  EKG Interpretation None       Radiology Ct Head Wo Contrast  Result Date: 09/04/2016 CLINICAL DATA:  Weakness.  Difficulty walking. EXAM: CT HEAD WITHOUT CONTRAST TECHNIQUE: Contiguous axial images were obtained from the base of the skull through the vertex  without intravenous contrast. COMPARISON:  08/05/2016 FINDINGS: Brain: Stable age advanced cerebral atrophy, ventriculomegaly and periventricular white matter disease. No findings for acute hemispheric infarction or intracranial hemorrhage. No mass lesions. The brainstem and cerebellum are grossly normal in stable. Vascular: No hyperdense vessel or unexpected calcification. Skull: No skull fracture or bone lesion. Sinuses/Orbits: The paranasal sinuses and mastoid air cells are clear. The globes are intact. Other: No scalp lesions. IMPRESSION: Stable age advanced cerebral atrophy, ventriculomegaly and periventricular white matter disease. No acute intracranial findings or mass lesions.  The Electronically Signed   By: Marijo Sanes M.D.   On: 09/04/2016 18:08    Procedures Procedures (including critical care time)  Medications Ordered in ED Medications - No data to display   Initial Impression / Assessment and Plan / ED Course  I have reviewed the triage vital signs and the nursing notes.  Pertinent labs & imaging results that were available during my care of the patient were reviewed by me and considered in my medical decision making (see chart for details).     CT scan of the head did not show any stroke. I suspect her symptoms are related to her Parkinson's disease. I instructed her to do what her neurologist suggested and follow-up with him for frequency  Final Clinical Impressions(s) / ED Diagnoses   Final diagnoses:  Weakness    New Prescriptions Discharge Medication List as of 09/04/2016  6:31 PM       Milton Ferguson, MD 09/05/16 2056

## 2016-09-08 DIAGNOSIS — M24562 Contracture, left knee: Secondary | ICD-10-CM | POA: Diagnosis not present

## 2016-09-08 DIAGNOSIS — S8392XD Sprain of unspecified site of left knee, subsequent encounter: Secondary | ICD-10-CM | POA: Diagnosis not present

## 2016-09-16 DIAGNOSIS — S8392XD Sprain of unspecified site of left knee, subsequent encounter: Secondary | ICD-10-CM | POA: Diagnosis not present

## 2016-09-16 DIAGNOSIS — M24562 Contracture, left knee: Secondary | ICD-10-CM | POA: Diagnosis not present

## 2016-09-17 DIAGNOSIS — Z79899 Other long term (current) drug therapy: Secondary | ICD-10-CM | POA: Diagnosis not present

## 2016-09-17 DIAGNOSIS — F319 Bipolar disorder, unspecified: Secondary | ICD-10-CM | POA: Diagnosis not present

## 2016-09-23 ENCOUNTER — Ambulatory Visit: Payer: Medicare Other | Admitting: Podiatry

## 2016-09-23 DIAGNOSIS — M24562 Contracture, left knee: Secondary | ICD-10-CM | POA: Diagnosis not present

## 2016-09-23 DIAGNOSIS — S8392XD Sprain of unspecified site of left knee, subsequent encounter: Secondary | ICD-10-CM | POA: Diagnosis not present

## 2016-09-29 DIAGNOSIS — Z79899 Other long term (current) drug therapy: Secondary | ICD-10-CM | POA: Diagnosis not present

## 2016-09-29 DIAGNOSIS — G2111 Neuroleptic induced parkinsonism: Secondary | ICD-10-CM | POA: Diagnosis not present

## 2016-09-29 DIAGNOSIS — M24562 Contracture, left knee: Secondary | ICD-10-CM | POA: Diagnosis not present

## 2016-09-29 DIAGNOSIS — M79601 Pain in right arm: Secondary | ICD-10-CM | POA: Diagnosis not present

## 2016-09-29 DIAGNOSIS — S8392XD Sprain of unspecified site of left knee, subsequent encounter: Secondary | ICD-10-CM | POA: Diagnosis not present

## 2016-09-29 DIAGNOSIS — G20 Parkinson's disease: Secondary | ICD-10-CM | POA: Diagnosis not present

## 2016-09-29 DIAGNOSIS — R269 Unspecified abnormalities of gait and mobility: Secondary | ICD-10-CM | POA: Diagnosis not present

## 2016-10-01 DIAGNOSIS — F319 Bipolar disorder, unspecified: Secondary | ICD-10-CM | POA: Diagnosis not present

## 2016-10-22 DIAGNOSIS — F319 Bipolar disorder, unspecified: Secondary | ICD-10-CM | POA: Diagnosis not present

## 2016-10-26 ENCOUNTER — Encounter (HOSPITAL_COMMUNITY): Payer: Self-pay | Admitting: Emergency Medicine

## 2016-10-26 ENCOUNTER — Emergency Department (HOSPITAL_COMMUNITY)
Admission: EM | Admit: 2016-10-26 | Discharge: 2016-10-26 | Disposition: A | Discharge status: Home / Self Care | Payer: Medicare Other | Attending: Emergency Medicine | Admitting: Emergency Medicine

## 2016-10-26 DIAGNOSIS — F17210 Nicotine dependence, cigarettes, uncomplicated: Secondary | ICD-10-CM | POA: Diagnosis not present

## 2016-10-26 DIAGNOSIS — Z79899 Other long term (current) drug therapy: Secondary | ICD-10-CM | POA: Insufficient documentation

## 2016-10-26 DIAGNOSIS — K1370 Unspecified lesions of oral mucosa: Secondary | ICD-10-CM | POA: Diagnosis present

## 2016-10-26 DIAGNOSIS — J449 Chronic obstructive pulmonary disease, unspecified: Secondary | ICD-10-CM | POA: Diagnosis not present

## 2016-10-26 DIAGNOSIS — K121 Other forms of stomatitis: Secondary | ICD-10-CM | POA: Diagnosis not present

## 2016-10-26 DIAGNOSIS — J45909 Unspecified asthma, uncomplicated: Secondary | ICD-10-CM | POA: Diagnosis not present

## 2016-10-26 MED ORDER — MAGIC MOUTHWASH W/LIDOCAINE: 5.0000 mL | Freq: Three times a day (TID) | ORAL | 0 refills | Status: DC | PRN

## 2016-10-26 MED ORDER — LIDOCAINE VISCOUS 2 % MT SOLN
15.0000 mL | Freq: Once | OROMUCOSAL | Status: AC
  Administered 2016-10-26: 15 mL via OROMUCOSAL
  Filled 2016-10-26: qty 15

## 2016-10-26 NOTE — ED Triage Notes (Signed)
Patient from [facility] complaining of mouth sores x 5 days.

## 2016-10-26 NOTE — Discharge Instructions (Signed)
Apply a small amt of the lidocaine every 3-4 hours to the affected areas using a cotton swab.  Follow-up with your doctor or dentist for recheck

## 2016-10-27 DIAGNOSIS — M24562 Contracture, left knee: Secondary | ICD-10-CM | POA: Diagnosis not present

## 2016-10-28 DIAGNOSIS — F319 Bipolar disorder, unspecified: Secondary | ICD-10-CM | POA: Diagnosis not present

## 2016-10-29 NOTE — ED Provider Notes (Signed)
[facility] DEPT Provider Note   CSN: [id number] Arrival date & time: 10/26/16  1726     History   Chief Complaint Chief Complaint  Patient presents with  . Mouth Lesions    HPI Karen Keller is a 61 y.o. female.  HPI  Karen Keller is a 61 y.o. female who presents to the Emergency Department complaining of painful "sores" in her mouth and on her tongue.  She describes sharp pain to her tongue and pain associated with brushing her teeth, chewing and eating certain foods.  Symptoms have been present for 5 days.  She denies bleeding, sore throat, fever, new medications and recent illness.  She has tried mouthwash without relief. No oral swelling.  Past Medical History:  Diagnosis Date  . Anxiety   . Asthma   . Bipolar 1 disorder ([place])   . Constipation   . COPD (chronic obstructive pulmonary disease) ([place])   . Hypertension   . Manic episode, unspecified ([place])   . Muscle weakness (generalized)   . Pain   . Seizures ([place])    had 1 seizure "many years ago" etiology unknown and never on any meds for this.    Patient Active Problem List   Diagnosis Date Noted  . Bilateral lower extremity edema 01/01/2016  . Encounter for screening colonoscopy 01/01/2016  . Macrocytic anemia 01/01/2016    Past Surgical History:  Procedure Laterality Date  . APPENDECTOMY    . BACK SURGERY     cervical fusion with cage  . closed fracture of shaft of right tibia    . COLONOSCOPY WITH PROPOFOL N/A 01/27/2016   Procedure: COLONOSCOPY WITH PROPOFOL;  Surgeon: Daneil Dolin, MD;  Location: [facility];  Service: Endoscopy;  Laterality: N/A;  1100  . WRIST SURGERY Right     OB History    No data available       Home Medications    Prior to Admission medications   Medication Sig Start Date End Date Taking? Authorizing Provider  acetaminophen (TYLENOL) 325 MG tablet Take 975 mg by mouth every 8 (eight) hours.     [provider]  albuterol (PROAIR HFA) 108 (90  Base) MCG/ACT inhaler Inhale 2 puffs into the lungs every 4 (four) hours as needed for wheezing or shortness of breath.    [provider]  ARIPiprazole (ABILIFY) 5 MG tablet Take 1 tablet by mouth at bedtime. 08/04/16   [provider]  aspirin 325 MG EC tablet Take 325 mg by mouth 2 (two) times daily.    [provider]  B Complex-Biotin-FA (B-COMPLEX PO) Take 1 tablet by mouth daily.    [provider]  benztropine (COGENTIN) 0.5 MG tablet Take 1 tablet by mouth at bedtime. 08/04/16   [provider]  buPROPion (WELLBUTRIN XL) 300 MG 24 hr tablet Take 300 mg by mouth daily.    [provider]  calcium-vitamin D (OSCAL WITH D) 500-200 MG-UNIT tablet Take 1 tablet by mouth 2 (two) times daily with a meal.    [provider]  carbidopa-levodopa (SINEMET IR) 10-100 MG tablet Take 1 tablet by mouth daily.     [provider]  divalproex (DEPAKOTE SPRINKLES) 125 MG capsule Take 500 mg by mouth 2 (two) times daily.    [provider]  docusate sodium (COLACE) 100 MG capsule Take 100 mg by mouth daily.    [provider]  hydrALAZINE (APRESOLINE) 25 MG tablet Take 25 mg by mouth 4 (four) times daily.  [provider]  lithium 300 MG tablet Take 300 mg by mouth 2 (two) times daily.    [provider]  LORazepam (ATIVAN) 0.5 MG tablet Take 0.25 mg by mouth every 12 (twelve) hours as needed for anxiety.     [provider]  magic mouthwash w/lidocaine SOLN Take 5 mLs by mouth 3 (three) times daily as needed for mouth pain. Swish and spit, do not swallow 10/26/16   Ramal Eckhardt, PA-C  Multiple Vitamin (MULTIVITAMIN WITH MINERALS) TABS tablet Take 1 tablet by mouth daily.    [provider]  naproxen (NAPROSYN) 500 MG tablet Take 500 mg by mouth 2 (two) times daily as needed for mild pain ((chest pain)).    [provider]  oxybutynin (DITROPAN-XL) 10 MG 24 hr tablet Take 10  mg by mouth daily.    [provider]  PARoxetine (PAXIL) 10 MG tablet Take 10 mg by mouth at bedtime.     [provider]  PARoxetine (PAXIL) 30 MG tablet Take 30 mg by mouth every morning.    [provider]  polyethylene glycol (MIRALAX / GLYCOLAX) packet Take 17 g by mouth daily.    [provider]  senna-docusate (SENNA-PLUS) 8.6-50 MG tablet Take 1 tablet by mouth daily as needed for mild constipation.    [provider]  traMADol (ULTRAM) 50 MG tablet Take 1 tablet (50 mg total) by mouth every 6 (six) hours as needed. Patient taking differently: Take 50 mg by mouth 2 (two) times daily.  06/10/16   Kem Parkinson, PA-C    Family History Family History  Problem Relation Age of Onset  . Heart disease Father   . Colon cancer Neg Hx     Social History Social History  Substance Use Topics  . Smoking status: Current Every Day Smoker    Packs/day: 0.50    Years: 40.00    Types: Cigarettes  . Smokeless tobacco: Never Used  . Alcohol use No     Allergies   Neurontin [gabapentin] and Septra ds [sulfamethoxazole-trimethoprim]   Review of Systems Review of Systems  Constitutional: Positive for appetite change. Negative for fever.  HENT: Positive for mouth sores. Negative for congestion, dental problem, facial swelling, rhinorrhea, sore throat and trouble swallowing.   Respiratory: Negative for cough.   Cardiovascular: Negative for chest pain.  Gastrointestinal: Negative for abdominal pain, nausea and vomiting.  Skin: Negative for rash.  Neurological: Negative for dizziness, speech difficulty and headaches.  Hematological: Negative for adenopathy.     Physical Exam Updated Vital Signs BP (!) 167/79 (BP Location: Right Arm)   Pulse 63   Temp 99.1 F (37.3 C) (Oral)   Resp 20   Ht 5\' 9"  (1.753 m)   Wt 70.3 kg (155 lb)   SpO2 95%   BMI 22.89 kg/m   Physical Exam  Constitutional: She is oriented to person, place, and time.  She appears well-developed and well-nourished. No distress.  HENT:  Head: Atraumatic.  Right Ear: Tympanic membrane and ear canal normal.  Left Ear: Tympanic membrane and ear canal normal.  Mouth/Throat: Uvula is midline and oropharynx is clear and moist. Mucous membranes are not dry. Oral lesions present. No trismus in the jaw.  Several small ulcerations to the lateral tongue and mucosa of the lower lip.  No edema.  Neck: Normal range of motion.  Cardiovascular: Normal rate and regular rhythm.   Pulmonary/Chest: Effort normal. No respiratory distress.  Lymphadenopathy:    She has no  cervical adenopathy.  Neurological: She is alert and oriented to person, place, and time. No sensory deficit.  Skin: Skin is warm. No rash noted.  Psychiatric: She has a normal mood and affect.  Nursing note and vitals reviewed.    ED Treatments / Results  Labs (all labs ordered are listed, but only abnormal results are displayed) Labs Reviewed - No data to display  EKG  EKG Interpretation None       Radiology No results found.  Procedures Procedures (including critical care time)  Medications Ordered in ED Medications  lidocaine (XYLOCAINE) 2 % viscous mouth solution 15 mL (15 mLs Mouth/Throat Given 10/26/16 1845)     Initial Impression / Assessment and Plan / ED Course  I have reviewed the triage vital signs and the nursing notes.  Pertinent labs & imaging results that were available during my care of the patient were reviewed by me and considered in my medical decision making (see chart for details).     Airway patent, no edema.  Handles own secretions well.  Few oral ulcerations.  Likely viral.  Pt stable for d/c.    Final Clinical Impressions(s) / ED Diagnoses   Final diagnoses:  Stomatitis, ulcerative    New Prescriptions Discharge Medication List as of 10/26/2016  6:45 PM    START taking these medications   Details  magic mouthwash w/lidocaine SOLN Take 5 mLs by mouth 3  (three) times daily as needed for mouth pain. Swish and spit, do not swallow, Starting Mon 10/26/2016, Print         Vanessa Melvin [place], [place] 10/29/16 1452    Dorie Rank, MD 10/29/16 1459

## 2016-10-30 ENCOUNTER — Ambulatory Visit (HOSPITAL_COMMUNITY): Payer: Medicare Other

## 2016-11-12 ENCOUNTER — Ambulatory Visit (HOSPITAL_COMMUNITY)
Admission: RE | Admit: 2016-11-12 | Discharge: 2016-11-12 | Disposition: A | Discharge status: Home / Self Care | Payer: Medicare Other | Source: Ambulatory Visit | Attending: Internal Medicine | Admitting: Internal Medicine

## 2016-11-12 DIAGNOSIS — Z1231 Encounter for screening mammogram for malignant neoplasm of breast: Secondary | ICD-10-CM | POA: Diagnosis not present

## 2016-11-25 DIAGNOSIS — Z23 Encounter for immunization: Secondary | ICD-10-CM | POA: Diagnosis not present

## 2016-11-25 DIAGNOSIS — J449 Chronic obstructive pulmonary disease, unspecified: Secondary | ICD-10-CM | POA: Diagnosis not present

## 2016-11-25 DIAGNOSIS — I10 Essential (primary) hypertension: Secondary | ICD-10-CM | POA: Diagnosis not present

## 2016-11-25 DIAGNOSIS — A690 Necrotizing ulcerative stomatitis: Secondary | ICD-10-CM | POA: Diagnosis not present

## 2016-12-14 ENCOUNTER — Encounter: Payer: Self-pay | Admitting: Internal Medicine

## 2016-12-28 DIAGNOSIS — Z79899 Other long term (current) drug therapy: Secondary | ICD-10-CM | POA: Diagnosis not present

## 2016-12-28 DIAGNOSIS — G2111 Neuroleptic induced parkinsonism: Secondary | ICD-10-CM | POA: Diagnosis not present

## 2016-12-28 DIAGNOSIS — I951 Orthostatic hypotension: Secondary | ICD-10-CM | POA: Diagnosis not present

## 2016-12-28 DIAGNOSIS — R269 Unspecified abnormalities of gait and mobility: Secondary | ICD-10-CM | POA: Diagnosis not present

## 2017-01-22 DIAGNOSIS — F319 Bipolar disorder, unspecified: Secondary | ICD-10-CM | POA: Diagnosis not present

## 2017-02-08 DIAGNOSIS — M79601 Pain in right arm: Secondary | ICD-10-CM | POA: Diagnosis not present

## 2017-02-08 DIAGNOSIS — G2111 Neuroleptic induced parkinsonism: Secondary | ICD-10-CM | POA: Diagnosis not present

## 2017-02-08 DIAGNOSIS — Z79899 Other long term (current) drug therapy: Secondary | ICD-10-CM | POA: Diagnosis not present

## 2017-02-08 DIAGNOSIS — G20 Parkinson's disease: Secondary | ICD-10-CM | POA: Diagnosis not present

## 2017-02-24 DIAGNOSIS — F319 Bipolar disorder, unspecified: Secondary | ICD-10-CM | POA: Diagnosis not present

## 2017-02-24 DIAGNOSIS — F17210 Nicotine dependence, cigarettes, uncomplicated: Secondary | ICD-10-CM | POA: Diagnosis not present

## 2017-02-24 DIAGNOSIS — M6281 Muscle weakness (generalized): Secondary | ICD-10-CM | POA: Diagnosis not present

## 2017-02-24 DIAGNOSIS — F17200 Nicotine dependence, unspecified, uncomplicated: Secondary | ICD-10-CM | POA: Diagnosis not present

## 2017-02-24 DIAGNOSIS — J449 Chronic obstructive pulmonary disease, unspecified: Secondary | ICD-10-CM | POA: Diagnosis not present

## 2017-03-15 ENCOUNTER — Other Ambulatory Visit (HOSPITAL_COMMUNITY): Payer: Self-pay | Admitting: Internal Medicine

## 2017-03-15 ENCOUNTER — Ambulatory Visit (HOSPITAL_COMMUNITY)
Admission: RE | Admit: 2017-03-15 | Discharge: 2017-03-15 | Disposition: A | Discharge status: Home / Self Care | Payer: Medicare Other | Source: Ambulatory Visit | Attending: Internal Medicine | Admitting: Internal Medicine

## 2017-03-15 DIAGNOSIS — J449 Chronic obstructive pulmonary disease, unspecified: Secondary | ICD-10-CM

## 2017-03-24 ENCOUNTER — Encounter: Payer: Self-pay | Admitting: Gastroenterology

## 2017-03-24 ENCOUNTER — Ambulatory Visit (INDEPENDENT_AMBULATORY_CARE_PROVIDER_SITE_OTHER): Payer: Medicare Other | Admitting: Gastroenterology

## 2017-03-24 DIAGNOSIS — D649 Anemia, unspecified: Secondary | ICD-10-CM | POA: Insufficient documentation

## 2017-03-24 NOTE — Patient Instructions (Signed)
1. We will obtain copy of last labs from PCP. If no evidence of anemia, we will plan on routine colonoscopy only. If anemia, you may require upper endoscopy as well. Further recommendations to follow.

## 2017-03-24 NOTE — Assessment & Plan Note (Signed)
62 year old female presenting today in follow-up.  She is due for 1 year follow-up colonoscopy due to poor bowel prep November 2017.  She has well managed constipation at this time.  There is some questionable anemia although I do not have any recent records indicating ongoing anemia.  Hemoglobin in June was normal.  We will request further records from Dr. Legrand Rams.  As discussed with patient today, we will plan on colonoscopy in the near future but if she does have evidence of anemia, she may upper endoscopy as well.  Once labs reviewed we will schedule her with deep sedation due to polypharmacy.  I have discussed the risks, alternatives, benefits with regards to but not limited to the risk of reaction to medication, bleeding, infection, perforation and the patient is agreeable to proceed. Written consent to be obtained.

## 2017-03-24 NOTE — Progress Notes (Signed)
Primary Care Physician:  Rosita Fire, MD  Primary Gastroenterologist:  Garfield Cornea, MD   Chief Complaint  Patient presents with  . Anemia    HPI:  Karen Keller is a 62 y.o. female here for?  Anemia.  She was last seen in October 2017 in the office for colonoscopy.  She was noted to have microcytic anemia at the time with normal B12 and folate levels.  Colonoscopy was attempted in November 2017 but prep was inadequate.  Advised to come back in 1 year for repeat colonoscopy.  Patient is not sure when her last labs were.  She had some back in June with normal hemoglobin, consistently elevated MCV of 107. We have requested additional labs from PCP for review.  Clinically she reports doing well.  She has a regular soft bowel movement daily using MiraLAX twice a day.  No melena or rectal bleeding.  No abdominal pain.  Appetite is good.  No dysphagia, vomiting, heartburn.  Current Outpatient Medications  Medication Sig Dispense Refill  . acetaminophen (TYLENOL) 325 MG tablet Take 975 mg by mouth every 8 (eight) hours.     Marland Kitchen albuterol (PROAIR HFA) 108 (90 Base) MCG/ACT inhaler Inhale 2 puffs into the lungs every 4 (four) hours as needed for wheezing or shortness of breath.    Marland Kitchen aspirin 325 MG EC tablet Take 325 mg by mouth 2 (two) times daily.    . B Complex-Biotin-FA (B-COMPLEX PO) Take 1 tablet by mouth daily.    . calcium-vitamin D (OSCAL WITH D) 500-200 MG-UNIT tablet Take 1 tablet by mouth 2 (two) times daily with a meal.    . divalproex (DEPAKOTE SPRINKLES) 125 MG capsule Take 500 mg by mouth 2 (two) times daily.    Marland Kitchen docusate sodium (COLACE) 100 MG capsule Take 100 mg by mouth daily.    . hydrALAZINE (APRESOLINE) 25 MG tablet Take 25 mg by mouth 4 (four) times daily.    Marland Kitchen lithium 300 MG tablet Take 300 mg by mouth 2 (two) times daily.    Marland Kitchen LORazepam (ATIVAN) 0.5 MG tablet Take 0.25 mg by mouth every 12 (twelve) hours as needed for anxiety.     . Multiple Vitamin (MULTIVITAMIN  WITH MINERALS) TABS tablet Take 1 tablet by mouth daily.    . naproxen (NAPROSYN) 500 MG tablet Take 500 mg by mouth 2 (two) times daily as needed for mild pain ((chest pain)).    Marland Kitchen OLANZapine (ZYPREXA) 2.5 MG tablet Take 2.5 mg by mouth at bedtime.    Marland Kitchen oxybutynin (DITROPAN-XL) 10 MG 24 hr tablet Take 10 mg by mouth daily.    Marland Kitchen PARoxetine (PAXIL) 10 MG tablet Take 10 mg by mouth at bedtime.     Marland Kitchen PARoxetine (PAXIL) 30 MG tablet Take 30 mg by mouth every morning.    . polyethylene glycol (MIRALAX / GLYCOLAX) packet Take 17 g by mouth daily.    Marland Kitchen senna-docusate (SENNA-PLUS) 8.6-50 MG tablet Take 1 tablet by mouth daily as needed for mild constipation.    . traMADol (ULTRAM) 50 MG tablet Take 1 tablet (50 mg total) by mouth every 6 (six) hours as needed. (Patient taking differently: Take 50 mg by mouth 2 (two) times daily. ) 15 tablet 0   No current facility-administered medications for this visit.     Allergies as of 03/24/2017 - Review Complete 03/24/2017  Allergen Reaction Noted  . Neurontin [gabapentin] Other (See Comments) 12/20/2014  . Septra ds [sulfamethoxazole-trimethoprim] Other (See Comments) 12/20/2014  Past Medical History:  Diagnosis Date  . Anxiety   . Asthma   . Bipolar 1 disorder ([place])   . Constipation   . COPD (chronic obstructive pulmonary disease) ([place])   . Hypertension   . Manic episode, unspecified ([place])   . Muscle weakness (generalized)   . Pain   . Seizures ([place])    had 1 seizure "many years ago" etiology unknown and never on any meds for this.    Past Surgical History:  Procedure Laterality Date  . APPENDECTOMY    . BACK SURGERY     cervical fusion with cage  . closed fracture of shaft of right tibia    . COLONOSCOPY WITH PROPOFOL N/A 01/27/2016   Procedure: COLONOSCOPY WITH PROPOFOL;  Surgeon: Daneil Dolin, MD;  Location: [facility];  Service: Endoscopy;  Laterality: N/A;  1100  . WRIST SURGERY Right     Family History  Problem Relation Age  of Onset  . Heart disease Father   . Colon cancer Neg Hx     Social History   Socioeconomic History  . Marital status: Divorced    Spouse name: Not on file  . Number of children: 0  . Years of education: Not on file  . Highest education level: Not on file  Social Needs  . Financial resource strain: Not on file  . Food insecurity - worry: Not on file  . Food insecurity - inability: Not on file  . Transportation needs - medical: Not on file  . Transportation needs - non-medical: Not on file  Occupational History  . Not on file  Tobacco Use  . Smoking status: Current Every Day Smoker    Packs/day: 0.50    Years: 40.00    Pack years: 20.00    Types: Cigarettes  . Smokeless tobacco: Never Used  Substance and Sexual Activity  . Alcohol use: No  . Drug use: No  . Sexual activity: No    Birth control/protection: None, Post-menopausal  Other Topics Concern  . Not on file  Social History Narrative  . Not on file      ROS:  General: Negative for anorexia, weight loss, fever, chills, fatigue, weakness. Eyes: Negative for vision changes.  ENT: Negative for hoarseness, difficulty swallowing , nasal congestion. CV: Negative for chest pain, angina, palpitations, dyspnea on exertion, peripheral edema.  Respiratory: Negative for dyspnea at rest, dyspnea on exertion, cough, sputum, wheezing.  GI: See history of present illness. GU:  Negative for dysuria, hematuria, urinary incontinence, urinary frequency, nocturnal urination.  MS: Negative for joint pain, low back pain.  Derm: Negative for rash or itching.  Neuro: Negative for weakness, abnormal sensation, seizure, frequent headaches, memory loss, confusion.  Psych: Negative for anxiety, depression, suicidal ideation, hallucinations.  Endo: Negative for unusual weight change.  Heme: Negative for bruising or bleeding. Allergy: Negative for rash or hives.    Physical Examination:  BP (!) 145/79   Pulse (!) 58   Temp (!) 97.2  F (36.2 C) (Oral)   Ht 5\' 8"  (1.727 m)   Wt 149 lb 6.4 oz (67.8 kg)   BMI 22.72 kg/m    General: Well-nourished, well-developed in no acute distress.  Head: Normocephalic, atraumatic.   Eyes: Conjunctiva pink, no icterus. Mouth: Oropharyngeal mucosa moist and pink , no lesions erythema or exudate. Neck: Supple without thyromegaly, masses, or lymphadenopathy.  Lungs: Clear to auscultation bilaterally.  Heart: Regular rate and rhythm, no murmurs rubs or gallops.  Abdomen: Bowel sounds  are normal, nontender, nondistended, no hepatosplenomegaly or masses, no abdominal bruits or    hernia , no rebound or guarding.   Rectal: not Performed  extremities: No lower extremity edema. No clubbing or deformities.  Neuro: Alert and oriented x 4 , grossly normal neurologically.  Skin: Warm and dry, no rash or jaundice.   Psych: Alert and cooperative, normal mood and affect.  Labs: Lab Results  Component Value Date   CREATININE 1.09 (H) 09/04/2016   BUN 22 (H) 09/04/2016   NA 137 09/04/2016   K 4.2 09/04/2016   CL 104 09/04/2016   CO2 25 09/04/2016   Lab Results  Component Value Date   ALT 36 09/04/2016   AST 40 09/04/2016   ALKPHOS 96 09/04/2016   BILITOT 0.4 09/04/2016   Lab Results  Component Value Date   WBC 7.3 09/04/2016   HGB 12.5 09/04/2016   HCT 39.0 09/04/2016   MCV 107.7 (H) 09/04/2016   PLT 259 09/04/2016     Imaging Studies: Dg Chest 2 View  Result Date: 03/15/2017 CLINICAL DATA:  COPD EXAM: CHEST  2 VIEW COMPARISON:  Chest radiograph 08/05/2016 FINDINGS: Mild hyperinflation. Normal cardiomediastinal contours. No focal airspace consolidation or pulmonary edema. No pleural effusion or pneumothorax. IMPRESSION: Mild hyperinflation without focal airspace disease. Electronically Signed   By: Ulyses Jarred M.D.   On: 03/15/2017 16:29

## 2017-03-25 NOTE — Progress Notes (Signed)
CC'D TO PCP °

## 2017-05-05 DIAGNOSIS — G20 Parkinson's disease: Secondary | ICD-10-CM | POA: Diagnosis not present

## 2017-05-05 DIAGNOSIS — G2111 Neuroleptic induced parkinsonism: Secondary | ICD-10-CM | POA: Diagnosis not present

## 2017-05-05 DIAGNOSIS — M79601 Pain in right arm: Secondary | ICD-10-CM | POA: Diagnosis not present

## 2017-05-05 DIAGNOSIS — Z79899 Other long term (current) drug therapy: Secondary | ICD-10-CM | POA: Diagnosis not present

## 2017-06-02 DIAGNOSIS — F319 Bipolar disorder, unspecified: Secondary | ICD-10-CM | POA: Diagnosis not present

## 2017-06-02 DIAGNOSIS — R739 Hyperglycemia, unspecified: Secondary | ICD-10-CM | POA: Diagnosis not present

## 2017-06-02 DIAGNOSIS — Z1389 Encounter for screening for other disorder: Secondary | ICD-10-CM | POA: Diagnosis not present

## 2017-06-02 DIAGNOSIS — Z0000 Encounter for general adult medical examination without abnormal findings: Secondary | ICD-10-CM | POA: Diagnosis not present

## 2017-06-02 DIAGNOSIS — F17200 Nicotine dependence, unspecified, uncomplicated: Secondary | ICD-10-CM | POA: Diagnosis not present

## 2017-06-02 DIAGNOSIS — Z1331 Encounter for screening for depression: Secondary | ICD-10-CM | POA: Diagnosis not present

## 2017-06-02 DIAGNOSIS — F17210 Nicotine dependence, cigarettes, uncomplicated: Secondary | ICD-10-CM | POA: Diagnosis not present

## 2017-06-02 DIAGNOSIS — J449 Chronic obstructive pulmonary disease, unspecified: Secondary | ICD-10-CM | POA: Diagnosis not present

## 2017-06-02 DIAGNOSIS — M6281 Muscle weakness (generalized): Secondary | ICD-10-CM | POA: Diagnosis not present

## 2017-06-02 DIAGNOSIS — R7989 Other specified abnormal findings of blood chemistry: Secondary | ICD-10-CM | POA: Diagnosis not present

## 2017-06-04 ENCOUNTER — Telehealth: Payer: Self-pay | Admitting: Gastroenterology

## 2017-06-04 NOTE — Telephone Encounter (Signed)
Please schedule OV using urgent spot if needed to follow up on new elevated LFTs and she is due for colonoscopy.   Somehow I overlooked follow up on this one and she was supposed to get scheduled back in 03/2017. Can we go ahead and get her a spot reserved for TCS with Dr. Gala Romney with propofol and expedite and appt for elevated LFTs/update H+P for colonoscopy.   Copy of labs will be located in my file container until her appointment and then will go for scanning.  Labs from 06/02/2017, hemoglobin A1c 4.7, BUN 20, creatinine 1.04, total bilirubin 0.5, alkaline phosphatase 113, AST 90, ALT 60, albumin 4.2, white blood cell count 5500, hemoglobin 12.2, MCV 101.1, platelets 210,000 Labs from March 2018, LFTs were normal, MCV was 101.6.

## 2017-06-07 NOTE — Telephone Encounter (Signed)
Called pt. Spot held for TCS 07/19/17 at 2:00pm.

## 2017-06-07 NOTE — Telephone Encounter (Signed)
Pt is scheduled for an office visit 06/15/17 @ 10:30 AM with [facility].

## 2017-06-15 ENCOUNTER — Encounter: Payer: Self-pay | Admitting: *Deleted

## 2017-06-15 ENCOUNTER — Ambulatory Visit (INDEPENDENT_AMBULATORY_CARE_PROVIDER_SITE_OTHER): Payer: Medicare Other | Admitting: Gastroenterology

## 2017-06-15 ENCOUNTER — Telehealth: Payer: Self-pay | Admitting: *Deleted

## 2017-06-15 ENCOUNTER — Other Ambulatory Visit: Payer: Self-pay | Admitting: *Deleted

## 2017-06-15 ENCOUNTER — Encounter: Payer: Self-pay | Admitting: Gastroenterology

## 2017-06-15 VITALS — BP 142/80 | HR 56 | Temp 98.3°F | Ht 68.0 in | Wt 157.6 lb

## 2017-06-15 DIAGNOSIS — R945 Abnormal results of liver function studies: Secondary | ICD-10-CM | POA: Diagnosis not present

## 2017-06-15 DIAGNOSIS — K5900 Constipation, unspecified: Secondary | ICD-10-CM | POA: Insufficient documentation

## 2017-06-15 DIAGNOSIS — D539 Nutritional anemia, unspecified: Secondary | ICD-10-CM

## 2017-06-15 DIAGNOSIS — R7989 Other specified abnormal findings of blood chemistry: Secondary | ICD-10-CM | POA: Insufficient documentation

## 2017-06-15 DIAGNOSIS — Z1211 Encounter for screening for malignant neoplasm of colon: Secondary | ICD-10-CM

## 2017-06-15 MED ORDER — LINACLOTIDE 290 MCG PO CAPS: 290.0000 ug | ORAL_CAPSULE | Freq: Every day | ORAL | 3 refills | Status: DC

## 2017-06-15 MED ORDER — NA SULFATE-K SULFATE-MG SULF 17.5-3.13-1.6 GM/177ML PO SOLN: 1.0000 | ORAL | 0 refills | Status: DC

## 2017-06-15 NOTE — Assessment & Plan Note (Signed)
LFTs are normal back in June of last year.  Now with mild elevation of transaminases.  Obtain labs, abdominal ultrasound.  Further recommendations to follow.

## 2017-06-15 NOTE — Assessment & Plan Note (Signed)
Stop MiraLAX, begin Linzess 290 mcg daily.  Discussed need to have constipation under control in order to ensure adequate bowel preparation this time.  Patient and/or staff will notify us if her constipation is not adequately managed.  Patient voiced understanding.  Pursue colonoscopy in the near future with propofol.  2-day clear liquid diet planned with additional Dulcolax prior to procedure as well.  I have discussed the risks, alternatives, benefits with regards to but not limited to the risk of reaction to medication, bleeding, infection, perforation and the patient is agreeable to proceed. Written consent to be obtained.

## 2017-06-15 NOTE — Telephone Encounter (Signed)
Pre-op scheduled for 07/12/17 at 11:00am. Lorenza Evangelist at [phone] and left detailed message on VM regarding appt.

## 2017-06-15 NOTE — Progress Notes (Signed)
cc'ed to pcp °

## 2017-06-15 NOTE — Patient Instructions (Signed)
1. Please have your labs and ultrasound done. We will be in touch with results within 7-10 business days.  2. Colonoscopy as scheduled. See separate instructions.  3. Stop miralax.  4. Start Linzess 227mcg daily on empty stomach for constipation.  5. Consider using your Naprosyn for pain you fill at your sternum. Try not to press on this area as it can cause ongoing inflammation if you are suffering from costochondritis. If does not improve, discuss with your PCP.

## 2017-06-15 NOTE — Progress Notes (Signed)
Primary Care Physician:  Rosita Fire, MD  Primary Gastroenterologist:  Garfield Cornea, MD   Chief Complaint  Patient presents with  . Colonoscopy    update h&p    HPI:  Karen Keller is a 62 y.o. female here to schedule colonoscopy and follow-up of abnormal LFTs.  She was seen back in January for ?anemia, we did not have any current labs when I saw her.  We had requested records so that we can determine whether she needs a colonoscopy with possible upper endoscopy for anemia.  There was some delay in getting these records.    Back in 2017 we saw him for macrocytic anemia, (B12 and folate were normal at that time) and attempted a colonoscopy but the prep was inadequate.  She was supposed to come back in a year for repeat colonoscopy.  We finally got some records on the patient and now we have been asked to evaluate NEW elevated LFTs.  Labs are from May 28, 2017.  BUN 20, creatinine 1.04, hemoglobin A1c 4.7, total bilirubin 0.5, alkaline phosphatase 113, AST 90, ALT 60, albumin 4.2, hemoglobin 12.2, hematocrit 35.5, white blood cell count 5500, platelets 210,000, MCV 101.1  Patient states she has chronic constipation.  Has a bowel movement about every other day but stools are hard and she has to strain.  She takes MiraLAX every day but feels like it is not enough.  Denies blood in the stool.  No weight loss.  Denies abdominal pain, heartburn, nausea or vomiting, dysphagia.  Only complaints of discomfort when she pushes on her sternum, she believes she has costochondritis.  This is been going on for about a month.  She has Naprosyn to use as needed but really has not tried it.  She has occasional sharp stabbing pain that lasts for seconds at a time and radiates into her back but not associated with any other upper GI symptoms.  Tells me years ago she is to have to get B12 shots but she is really not sure why or how long she took them.   Current Outpatient Medications  Medication Sig  Dispense Refill  . acetaminophen (TYLENOL) 325 MG tablet Take 975 mg by mouth every 8 (eight) hours.     Marland Kitchen albuterol (PROAIR HFA) 108 (90 Base) MCG/ACT inhaler Inhale 2 puffs into the lungs 4 (four) times daily.     Marland Kitchen aspirin 325 MG EC tablet Take 325 mg by mouth 2 (two) times daily.    . B Complex-Biotin-FA (B-COMPLEX PO) Take 1 tablet by mouth daily.    . calcium-vitamin D (OSCAL WITH D) 500-200 MG-UNIT tablet Take 1 tablet by mouth 2 (two) times daily with a meal.    . divalproex (DEPAKOTE SPRINKLES) 125 MG capsule Take 500 mg by mouth 2 (two) times daily.    Marland Kitchen docusate sodium (COLACE) 100 MG capsule Take 100 mg by mouth daily.    . hydrALAZINE (APRESOLINE) 25 MG tablet Take 25 mg by mouth 4 (four) times daily.    Marland Kitchen lithium 300 MG tablet Take 300 mg by mouth 2 (two) times daily.    Marland Kitchen LORazepam (ATIVAN) 0.5 MG tablet Take 0.25 mg by mouth every 12 (twelve) hours as needed for anxiety.     . Multiple Vitamin (MULTIVITAMIN WITH MINERALS) TABS tablet Take 1 tablet by mouth daily.    . naproxen (NAPROSYN) 500 MG tablet Take 500 mg by mouth 2 (two) times daily as needed for mild pain ((chest pain)).    Marland Kitchen  OLANZapine (ZYPREXA) 2.5 MG tablet Take 2.5 mg by mouth at bedtime.    Marland Kitchen oxybutynin (DITROPAN-XL) 10 MG 24 hr tablet Take 10 mg by mouth daily.    Marland Kitchen PARoxetine (PAXIL) 10 MG tablet Take 10 mg by mouth at bedtime.     Marland Kitchen PARoxetine (PAXIL) 30 MG tablet Take 30 mg by mouth every morning.    . polyethylene glycol (MIRALAX / GLYCOLAX) packet Take 17 g by mouth daily.    Marland Kitchen senna-docusate (SENNA-PLUS) 8.6-50 MG tablet Take 1 tablet by mouth daily as needed for mild constipation.    . traMADol (ULTRAM) 50 MG tablet Take 1 tablet (50 mg total) by mouth every 6 (six) hours as needed. (Patient taking differently: Take 50 mg by mouth 2 (two) times daily. ) 15 tablet 0   No current facility-administered medications for this visit.     Allergies as of 06/15/2017 - Review Complete 06/15/2017  Allergen  Reaction Noted  . Neurontin [gabapentin] Other (See Comments) 12/20/2014  . Septra ds [sulfamethoxazole-trimethoprim] Other (See Comments) 12/20/2014    Past Medical History:  Diagnosis Date  . Anxiety   . Asthma   . Bipolar 1 disorder ([place])   . Constipation   . COPD (chronic obstructive pulmonary disease) ([place])   . Hypertension   . Manic episode, unspecified ([place])   . Muscle weakness (generalized)   . Pain   . Seizures ([place])    had 1 seizure "many years ago" etiology unknown and never on any meds for this.    Past Surgical History:  Procedure Laterality Date  . APPENDECTOMY    . BACK SURGERY     cervical fusion with cage  . closed fracture of shaft of right tibia    . COLONOSCOPY WITH PROPOFOL N/A 01/27/2016   Dr. Gala Romney, colon prep inadequate.  Vegetable matter/viscous stool throughout the colon with much of colonic mucosa not seen.  . WRIST SURGERY Right     Family History  Problem Relation Age of Onset  . Heart disease Father   . Colon cancer Neg Hx     Social History   Socioeconomic History  . Marital status: Divorced    Spouse name: Not on file  . Number of children: 0  . Years of education: Not on file  . Highest education level: Not on file  Occupational History  . Not on file  Social Needs  . Financial resource strain: Not on file  . Food insecurity:    Worry: Not on file    Inability: Not on file  . Transportation needs:    Medical: Not on file    Non-medical: Not on file  Tobacco Use  . Smoking status: Current Every Day Smoker    Packs/day: 0.50    Years: 40.00    Pack years: 20.00    Types: Cigarettes  . Smokeless tobacco: Never Used  Substance and Sexual Activity  . Alcohol use: No    Comment: has never consumed significant etoh in past and none in present  . Drug use: No  . Sexual activity: Never    Birth control/protection: None, Post-menopausal  Lifestyle  . Physical activity:    Days per week: Not on file    Minutes per session:  Not on file  . Stress: Not on file  Relationships  . Social connections:    Talks on phone: Not on file    Gets together: Not on file    Attends religious service: Not on file  Active member of club or organization: Not on file    Attends meetings of clubs or organizations: Not on file    Relationship status: Not on file  . Intimate partner violence:    Fear of current or ex partner: Not on file    Emotionally abused: Not on file    Physically abused: Not on file    Forced sexual activity: Not on file  Other Topics Concern  . Not on file  Social History Narrative  . Not on file      ROS:  General: Negative for anorexia, weight loss, fever, chills, fatigue, weakness. Eyes: Negative for vision changes.  ENT: Negative for hoarseness, difficulty swallowing , nasal congestion. CV: Negative for chest pain, angina, palpitations, dyspnea on exertion, peripheral edema.  Respiratory: Negative for dyspnea at rest, dyspnea on exertion, cough, sputum, wheezing.  GI: See history of present illness. GU:  Negative for dysuria, hematuria, urinary incontinence, urinary frequency, nocturnal urination.  MS: Negative for joint pain, low back pain.  See hpi. Derm: Negative for rash or itching.  Neuro: Negative for weakness, abnormal sensation, seizure, frequent headaches, memory loss, confusion.  Psych: Negative for anxiety, depression, suicidal ideation, hallucinations.  Endo: Negative for unusual weight change.  Heme: Negative for bruising or bleeding. Allergy: Negative for rash or hives.    Physical Examination:  BP (!) 142/80   Pulse (!) 56   Temp 98.3 F (36.8 C) (Oral)   Ht 5\' 8"  (1.727 m)   Wt 157 lb 9.6 oz (71.5 kg)   BMI 23.96 kg/m    General: Well-nourished, well-developed in no acute distress. Accompanied by staff from group home. Patient has tanned almost orange appearance of fact, neck and hands. Abdomen "normal" in color.  Head: Normocephalic, atraumatic.   Eyes:  Conjunctiva pink, no icterus. Mouth: Oropharyngeal mucosa moist and pink , no lesions erythema or exudate. Neck: Supple without thyromegaly, masses, or lymphadenopathy.  Lungs: Clear to auscultation bilaterally.  Heart: Regular rate and rhythm, no murmurs rubs or gallops.  Abdomen: Bowel sounds are normal, nontender, nondistended, no hepatosplenomegaly or masses, no abdominal bruits or    hernia , no rebound or guarding.  Some point tenderness over lower sternum with palpation Rectal: not performed. Extremities: No lower extremity edema. No clubbing or deformities.  Neuro: Alert and oriented x 4 , grossly normal neurologically.  Skin: Warm and dry, no rash or jaundice.   Psych: Alert and cooperative, normal mood and affect.  Labs: See above.   Lab Results  Component Value Date   ALT 36 09/04/2016   AST 40 09/04/2016   ALKPHOS 96 09/04/2016   BILITOT 0.4 09/04/2016     Imaging Studies: No results found.

## 2017-06-15 NOTE — Assessment & Plan Note (Signed)
Macrocytosis.  Previous history of B12 injections in the remote past.  B12 and folate were normal 2 years ago.  Recheck now.

## 2017-06-16 ENCOUNTER — Telehealth: Payer: Self-pay | Admitting: *Deleted

## 2017-06-16 NOTE — Telephone Encounter (Signed)
Spoke with [name] and is aware patient u/s is scheduled for 06/21/17 at 9:30am, arrival time 9:15am, npo after midnight prior to test. Nothing further needed

## 2017-06-21 ENCOUNTER — Ambulatory Visit (HOSPITAL_COMMUNITY)
Admission: RE | Admit: 2017-06-21 | Discharge: 2017-06-21 | Disposition: A | Discharge status: Home / Self Care | Payer: Medicare Other | Source: Ambulatory Visit | Attending: Gastroenterology | Admitting: Gastroenterology

## 2017-06-21 DIAGNOSIS — N2889 Other specified disorders of kidney and ureter: Secondary | ICD-10-CM | POA: Diagnosis not present

## 2017-06-21 DIAGNOSIS — R945 Abnormal results of liver function studies: Secondary | ICD-10-CM | POA: Diagnosis not present

## 2017-06-21 DIAGNOSIS — K828 Other specified diseases of gallbladder: Secondary | ICD-10-CM | POA: Insufficient documentation

## 2017-06-21 DIAGNOSIS — R7989 Other specified abnormal findings of blood chemistry: Secondary | ICD-10-CM

## 2017-06-21 DIAGNOSIS — D539 Nutritional anemia, unspecified: Secondary | ICD-10-CM | POA: Insufficient documentation

## 2017-06-21 DIAGNOSIS — K5900 Constipation, unspecified: Secondary | ICD-10-CM | POA: Diagnosis not present

## 2017-06-24 LAB — HEPATIC FUNCTION PANEL
AG RATIO: 1.6 (calc) (ref 1.0–2.5)
ALKALINE PHOSPHATASE (APISO): 136 U/L — AB (ref 33–130)
ALT: 66 U/L — AB (ref 6–29)
AST: 82 U/L — ABNORMAL HIGH (ref 10–35)
Albumin: 4.2 g/dL (ref 3.6–5.1)
Bilirubin, Direct: 0.2 mg/dL (ref 0.0–0.2)
GLOBULIN: 2.7 g/dL (ref 1.9–3.7)
Indirect Bilirubin: 0.4 mg/dL (calc) (ref 0.2–1.2)
TOTAL PROTEIN: 6.9 g/dL (ref 6.1–8.1)
Total Bilirubin: 0.6 mg/dL (ref 0.2–1.2)

## 2017-06-24 LAB — IRON,TIBC AND FERRITIN PANEL
%SAT: 29 % (ref 11–50)
Ferritin: 105 ng/mL (ref 20–288)
Iron: 117 ug/dL (ref 45–160)
TIBC: 403 ug/dL (ref 250–450)

## 2017-06-24 LAB — HEPATITIS B CORE ANTIBODY, TOTAL: Hep B Core Total Ab: NONREACTIVE

## 2017-06-24 LAB — HCV RNA,QUANTITATIVE REAL TIME PCR
HCV Quantitative Log: 5.78 Log IU/mL — ABNORMAL HIGH
HCV RNA, PCR, QN: 596000 IU/mL — ABNORMAL HIGH

## 2017-06-24 LAB — HEPATITIS C ANTIBODY
Hepatitis C Ab: REACTIVE — AB
SIGNAL TO CUT-OFF: 21 — AB (ref <1.00)

## 2017-06-24 LAB — VITAMIN B12: VITAMIN B 12: 1518 pg/mL — AB (ref 200–1100)

## 2017-06-24 LAB — HEPATITIS B SURFACE ANTIGEN: Hepatitis B Surface Ag: NONREACTIVE

## 2017-06-24 LAB — FOLATE: FOLATE: 21.7 ng/mL

## 2017-06-24 NOTE — Progress Notes (Signed)
Iron/B12/Folate all normal. Hep B negative.   She has Hepatitis C. Unclear what her risk factors are, she resides in a group home.  She will need follow up OV to discuss treatment options.  Keep colonoscopy as scheduled.  See u/s result note, needs MRCP.

## 2017-06-24 NOTE — Progress Notes (Signed)
Please let patient know her CBD is dilated and the distal portion could not be seen by u/s. Cannot rule out biliary obstruction with this u/s so she's going to need MRCP.   Please schedule MRCP for elevated lfts and dilated CBD. See lab result note too.

## 2017-06-29 ENCOUNTER — Other Ambulatory Visit: Payer: Self-pay

## 2017-06-29 DIAGNOSIS — R945 Abnormal results of liver function studies: Secondary | ICD-10-CM

## 2017-06-29 DIAGNOSIS — R7989 Other specified abnormal findings of blood chemistry: Secondary | ICD-10-CM

## 2017-06-29 DIAGNOSIS — K838 Other specified diseases of biliary tract: Secondary | ICD-10-CM

## 2017-06-29 NOTE — Progress Notes (Signed)
Carin Primrose of the facility is aware. ( See lab result also). OK to schedule the MRCP. Forwarding to [facility] to schedule.

## 2017-06-29 NOTE — Progress Notes (Signed)
Carin Primrose of the facility is aware and I am faxing this to him at [phone] to have for her records.  He is aware to keep colonoscopy as scheduled. Forwarding to [place] to schedule the office visit.

## 2017-06-30 ENCOUNTER — Encounter: Payer: Self-pay | Admitting: Internal Medicine

## 2017-06-30 NOTE — Progress Notes (Signed)
PATIENT SCHEDULED AND LETTER SENT  °

## 2017-07-06 ENCOUNTER — Other Ambulatory Visit: Payer: Self-pay | Admitting: Gastroenterology

## 2017-07-06 ENCOUNTER — Ambulatory Visit (HOSPITAL_COMMUNITY)
Admission: RE | Admit: 2017-07-06 | Discharge: 2017-07-06 | Disposition: A | Discharge status: Home / Self Care | Payer: Medicare Other | Source: Ambulatory Visit | Attending: Gastroenterology | Admitting: Gastroenterology

## 2017-07-06 DIAGNOSIS — K769 Liver disease, unspecified: Secondary | ICD-10-CM | POA: Insufficient documentation

## 2017-07-06 DIAGNOSIS — R945 Abnormal results of liver function studies: Secondary | ICD-10-CM

## 2017-07-06 DIAGNOSIS — R7989 Other specified abnormal findings of blood chemistry: Secondary | ICD-10-CM

## 2017-07-06 DIAGNOSIS — N281 Cyst of kidney, acquired: Secondary | ICD-10-CM | POA: Diagnosis not present

## 2017-07-06 DIAGNOSIS — K838 Other specified diseases of biliary tract: Secondary | ICD-10-CM | POA: Diagnosis not present

## 2017-07-06 DIAGNOSIS — K7689 Other specified diseases of liver: Secondary | ICD-10-CM | POA: Diagnosis not present

## 2017-07-06 DIAGNOSIS — R932 Abnormal findings on diagnostic imaging of liver and biliary tract: Secondary | ICD-10-CM | POA: Diagnosis not present

## 2017-07-06 LAB — POCT I-STAT CREATININE: Creatinine, Ser: 1.1 mg/dL — ABNORMAL HIGH (ref 0.44–1.00)

## 2017-07-06 MED ORDER — GADOBENATE DIMEGLUMINE 529 MG/ML IV SOLN
15.0000 mL | Freq: Once | INTRAVENOUS | Status: AC | PRN
  Administered 2017-07-06: 14 mL via INTRAVENOUS

## 2017-07-08 NOTE — Patient Instructions (Signed)
Karen Keller  [birth year]     @PREFPERIOPPHARMACY @   Your procedure is scheduled on  07/19/2017   Report to [facility] at  49   A.M.  Call this number if you have problems the morning of surgery:  [phone]   Remember:  Do not eat food or drink liquids after midnight.  Take these medicines the morning of surgery with A SIP OF WATER  Depakote, lithium, ativan, ditropan, paxil, ultram. Use your inhaler before you come and bring it with you.   Do not wear jewelry, make-up or nail polish.  Do not wear lotions, powders, or perfumes, or deodorant.  Do not shave 48 hours prior to surgery.  Men may shave face and neck.  Do not bring valuables to the hospital.  [facility] is not responsible for any belongings or valuables.  Contacts, dentures or bridgework may not be worn into surgery.  Leave your suitcase in the car.  After surgery it may be brought to your room.  For patients admitted to the hospital, discharge time will be determined by your treatment team.  Patients discharged the day of surgery will not be allowed to drive home.   Name and phone number of your driver:   family Special instructions:  Follow the diet and prep instructions given to you by Dr Roseanne Kaufman office.  Please read over the following fact sheets that you were given. Anesthesia Post-op Instructions and Care and Recovery After Surgery       Colonoscopy, Adult A colonoscopy is an exam to look at the large intestine. It is done to check for problems, such as:  Lumps (tumors).  Growths (polyps).  Swelling (inflammation).  Bleeding.  What happens before the procedure? Eating and drinking Follow instructions from your doctor about eating and drinking. These instructions may include:  A few days before the procedure - follow a low-fiber diet. ? Avoid nuts. ? Avoid seeds. ? Avoid dried fruit. ? Avoid raw fruits. ? Avoid vegetables.  1-3 days before the procedure - follow a  clear liquid diet. Avoid liquids that have red or purple dye. Drink only clear liquids, such as: ? Clear broth or bouillon. ? Black coffee or tea. ? Clear juice. ? Clear soft drinks or sports drinks. ? Gelatin dessert. ? Popsicles.  On the day of the procedure - do not eat or drink anything during the 2 hours before the procedure.  Bowel prep If you were prescribed an oral bowel prep:  Take it as told by your doctor. Starting the day before your procedure, you will need to drink a lot of liquid. The liquid will cause you to poop (have bowel movements) until your poop is almost clear or light green.  If your skin or butt gets irritated from diarrhea, you may: ? Wipe the area with wipes that have medicine in them, such as adult wet wipes with aloe and vitamin E. ? Put something on your skin that soothes the area, such as petroleum jelly.  If you throw up (vomit) while drinking the bowel prep, take a break for up to 60 minutes. Then begin the bowel prep again. If you keep throwing up and you cannot take the bowel prep without throwing up, call your doctor.  General instructions  Ask your doctor about changing or stopping your normal medicines. This is important if you take diabetes medicines or blood thinners.  Plan to have someone take you home from  the hospital or clinic. What happens during the procedure?  An IV tube may be put into one of your veins.  You will be given medicine to help you relax (sedative).  To reduce your risk of infection: ? Your doctors will wash their hands. ? Your anal area will be washed with soap.  You will be asked to lie on your side with your knees bent.  Your doctor will get a long, thin, flexible tube ready. The tube will have a camera and a light on the end.  The tube will be put into your anus.  The tube will be gently put into your large intestine.  Air will be delivered into your large intestine to keep it open. You may feel some pressure  or cramping.  The camera will be used to take photos.  A small tissue sample may be removed from your body to be looked at under a microscope (biopsy). If any possible problems are found, the tissue will be sent to a lab for testing.  If small growths are found, your doctor may remove them and have them checked for cancer.  The tube that was put into your anus will be slowly removed. The procedure may vary among doctors and hospitals. What happens after the procedure?  Your doctor will check on you often until the medicines you were given have worn off.  Do not drive for 24 hours after the procedure.  You may have a small amount of blood in your poop.  You may pass gas.  You may have mild cramps or bloating in your belly (abdomen).  It is up to you to get the results of your procedure. Ask your doctor, or the department performing the procedure, when your results will be ready. This information is not intended to replace advice given to you by your health care provider. Make sure you discuss any questions you have with your health care provider. Document Released: 03/28/2010 Document Revised: 12/25/2015 Document Reviewed: 05/07/2015 Elsevier Interactive Patient Education  2017 [employer]  Colonoscopy, Adult, Care After This sheet gives you information about how to care for yourself after your procedure. Your health care provider may also give you more specific instructions. If you have problems or questions, contact your health care provider. What can I expect after the procedure? After the procedure, it is common to have:  A small amount of blood in your stool for 24 hours after the procedure.  Some gas.  Mild abdominal cramping or bloating.  Follow these instructions at home: General instructions   For the first 24 hours after the procedure: ? Do not drive or use machinery. ? Do not sign important documents. ? Do not drink alcohol. ? Do your regular daily  activities at a slower pace than normal. ? Eat soft, easy-to-digest foods. ? Rest often.  Take over-the-counter or prescription medicines only as told by your health care provider.  It is up to you to get the results of your procedure. Ask your health care provider, or the department performing the procedure, when your results will be ready. Relieving cramping and bloating  Try walking around when you have cramps or feel bloated.  Apply heat to your abdomen as told by your health care provider. Use a heat source that your health care provider recommends, such as a moist heat pack or a heating pad. ? Place a towel between your skin and the heat source. ? Leave the heat on for 20-30 minutes. ? Remove  the heat if your skin turns bright red. This is especially important if you are unable to feel pain, heat, or cold. You may have a greater risk of getting burned. Eating and drinking  Drink enough fluid to keep your urine clear or pale yellow.  Resume your normal diet as instructed by your health care provider. Avoid heavy or fried foods that are hard to digest.  Avoid drinking alcohol for as long as instructed by your health care provider. Contact a health care provider if:  You have blood in your stool 2-3 days after the procedure. Get help right away if:  You have more than a small spotting of blood in your stool.  You pass large blood clots in your stool.  Your abdomen is swollen.  You have nausea or vomiting.  You have a fever.  You have increasing abdominal pain that is not relieved with medicine. This information is not intended to replace advice given to you by your health care provider. Make sure you discuss any questions you have with your health care provider. Document Released: 10/08/2003 Document Revised: 11/18/2015 Document Reviewed: 05/07/2015 Elsevier Interactive Patient Education  2018 [place] Anesthesia is a term that refers to  techniques, procedures, and medicines that help a person stay safe and comfortable during a medical procedure. Monitored anesthesia care, or sedation, is one type of anesthesia. Your anesthesia specialist may recommend sedation if you will be having a procedure that does not require you to be unconscious, such as:  Cataract surgery.  A dental procedure.  A biopsy.  A colonoscopy.  During the procedure, you may receive a medicine to help you relax (sedative). There are three levels of sedation:  Mild sedation. At this level, you may feel awake and relaxed. You will be able to follow directions.  Moderate sedation. At this level, you will be sleepy. You may not remember the procedure.  Deep sedation. At this level, you will be asleep. You will not remember the procedure.  The more medicine you are given, the deeper your level of sedation will be. Depending on how you respond to the procedure, the anesthesia specialist may change your level of sedation or the type of anesthesia to fit your needs. An anesthesia specialist will monitor you closely during the procedure. Let your health care provider know about:  Any allergies you have.  All medicines you are taking, including vitamins, herbs, eye drops, creams, and over-the-counter medicines.  Any use of steroids (by mouth or as a cream).  Any problems you or family members have had with sedatives and anesthetic medicines.  Any blood disorders you have.  Any surgeries you have had.  Any medical conditions you have, such as sleep apnea.  Whether you are pregnant or may be pregnant.  Any use of cigarettes, alcohol, or street drugs. What are the risks? Generally, this is a safe procedure. However, problems may occur, including:  Getting too much medicine (oversedation).  Nausea.  Allergic reaction to medicines.  Trouble breathing. If this happens, a breathing tube may be used to help with breathing. It will be removed when you  are awake and breathing on your own.  Heart trouble.  Lung trouble.  Before the procedure Staying hydrated Follow instructions from your health care provider about hydration, which may include:  Up to 2 hours before the procedure - you may continue to drink clear liquids, such as water, clear fruit juice, black coffee, and plain tea.  Eating  and drinking restrictions Follow instructions from your health care provider about eating and drinking, which may include:  8 hours before the procedure - stop eating heavy meals or foods such as meat, fried foods, or fatty foods.  6 hours before the procedure - stop eating light meals or foods, such as toast or cereal.  6 hours before the procedure - stop drinking milk or drinks that contain milk.  2 hours before the procedure - stop drinking clear liquids.  Medicines Ask your health care provider about:  Changing or stopping your regular medicines. This is especially important if you are taking diabetes medicines or blood thinners.  Taking medicines such as aspirin and ibuprofen. These medicines can thin your blood. Do not take these medicines before your procedure if your health care provider instructs you not to.  Tests and exams  You will have a physical exam.  You may have blood tests done to show: ? How well your kidneys and liver are working. ? How well your blood can clot.  General instructions  Plan to have someone take you home from the hospital or clinic.  If you will be going home right after the procedure, plan to have someone with you for 24 hours.  What happens during the procedure?  Your blood pressure, heart rate, breathing, level of pain and overall condition will be monitored.  An IV tube will be inserted into one of your veins.  Your anesthesia specialist will give you medicines as needed to keep you comfortable during the procedure. This may mean changing the level of sedation.  The procedure will be  performed. After the procedure  Your blood pressure, heart rate, breathing rate, and blood oxygen level will be monitored until the medicines you were given have worn off.  Do not drive for 24 hours if you received a sedative.  You may: ? Feel sleepy, clumsy, or nauseous. ? Feel forgetful about what happened after the procedure. ? Have a sore throat if you had a breathing tube during the procedure. ? Vomit. This information is not intended to replace advice given to you by your health care provider. Make sure you discuss any questions you have with your health care provider. Document Released: 11/19/2004 Document Revised: 08/02/2015 Document Reviewed: 06/16/2015 Elsevier Interactive Patient Education  2018 [place], Care After These instructions provide you with information about caring for yourself after your procedure. Your health care provider may also give you more specific instructions. Your treatment has been planned according to current medical practices, but problems sometimes occur. Call your health care provider if you have any problems or questions after your procedure. What can I expect after the procedure? After your procedure, it is common to:  Feel sleepy for several hours.  Feel clumsy and have poor balance for several hours.  Feel forgetful about what happened after the procedure.  Have poor judgment for several hours.  Feel nauseous or vomit.  Have a sore throat if you had a breathing tube during the procedure.  Follow these instructions at home: For at least 24 hours after the procedure:   Do not: ? Participate in activities in which you could fall or become injured. ? Drive. ? Use heavy machinery. ? Drink alcohol. ? Take sleeping pills or medicines that cause drowsiness. ? Make important decisions or sign legal documents. ? Take care of children on your own.  Rest. Eating and drinking  Follow the diet that is  recommended by your  health care provider.  If you vomit, drink water, juice, or soup when you can drink without vomiting.  Make sure you have little or no nausea before eating solid foods. General instructions  Have a responsible adult stay with you until you are awake and alert.  Take over-the-counter and prescription medicines only as told by your health care provider.  If you smoke, do not smoke without supervision.  Keep all follow-up visits as told by your health care provider. This is important. Contact a health care provider if:  You keep feeling nauseous or you keep vomiting.  You feel light-headed.  You develop a rash.  You have a fever. Get help right away if:  You have trouble breathing. This information is not intended to replace advice given to you by your health care provider. Make sure you discuss any questions you have with your health care provider. Document Released: 06/16/2015 Document Revised: 10/16/2015 Document Reviewed: 06/16/2015 Elsevier Interactive Patient Education  [employer].

## 2017-07-12 ENCOUNTER — Encounter (HOSPITAL_COMMUNITY)
Admission: RE | Admit: 2017-07-12 | Discharge: 2017-07-12 | Disposition: A | Discharge status: Home / Self Care | Payer: Medicare Other | Source: Ambulatory Visit | Attending: Internal Medicine | Admitting: Internal Medicine

## 2017-07-13 ENCOUNTER — Other Ambulatory Visit: Payer: Self-pay

## 2017-07-13 ENCOUNTER — Encounter (HOSPITAL_COMMUNITY): Payer: Self-pay

## 2017-07-13 ENCOUNTER — Encounter (HOSPITAL_COMMUNITY)
Admission: RE | Admit: 2017-07-13 | Discharge: 2017-07-13 | Disposition: A | Discharge status: Home / Self Care | Payer: Medicare Other | Source: Ambulatory Visit | Attending: Internal Medicine | Admitting: Internal Medicine

## 2017-07-13 DIAGNOSIS — R001 Bradycardia, unspecified: Secondary | ICD-10-CM | POA: Insufficient documentation

## 2017-07-13 DIAGNOSIS — R9431 Abnormal electrocardiogram [ECG] [EKG]: Secondary | ICD-10-CM | POA: Diagnosis not present

## 2017-07-13 DIAGNOSIS — Z01810 Encounter for preprocedural cardiovascular examination: Secondary | ICD-10-CM | POA: Insufficient documentation

## 2017-07-13 DIAGNOSIS — Z01812 Encounter for preprocedural laboratory examination: Secondary | ICD-10-CM | POA: Diagnosis not present

## 2017-07-13 LAB — BASIC METABOLIC PANEL
Anion gap: 8 (ref 5–15)
BUN: 23 mg/dL — ABNORMAL HIGH (ref 6–20)
CALCIUM: 10.4 mg/dL — AB (ref 8.9–10.3)
CHLORIDE: 105 mmol/L (ref 101–111)
CO2: 25 mmol/L (ref 22–32)
Creatinine, Ser: 0.98 mg/dL (ref 0.44–1.00)
GFR calc non Af Amer: >60 mL/min (ref >60)
Glucose, Bld: 85 mg/dL (ref 65–99)
Potassium: 4.2 mmol/L (ref 3.5–5.1)
Sodium: 138 mmol/L (ref 135–145)

## 2017-07-13 LAB — CBC
HCT: 39.7 % (ref 36.0–46.0)
HEMOGLOBIN: 12.8 g/dL (ref 12.0–15.0)
MCH: 34.3 pg — AB (ref 26.0–34.0)
MCHC: 32.2 g/dL (ref 30.0–36.0)
MCV: 106.4 fL — ABNORMAL HIGH (ref 78.0–100.0)
Platelets: 230 10*3/uL (ref 150–400)
RBC: 3.73 MIL/uL — ABNORMAL LOW (ref 3.87–5.11)
RDW: 13.8 % (ref 11.5–15.5)
WBC: 6.6 10*3/uL (ref 4.0–10.5)

## 2017-07-19 ENCOUNTER — Ambulatory Visit (HOSPITAL_COMMUNITY): Payer: Medicare Other | Admitting: Anesthesiology

## 2017-07-19 ENCOUNTER — Encounter (HOSPITAL_COMMUNITY): Payer: Self-pay

## 2017-07-19 ENCOUNTER — Encounter (HOSPITAL_COMMUNITY)
Admission: RE | Disposition: A | Discharge status: Home / Self Care | Payer: Self-pay | Source: Ambulatory Visit | Attending: Internal Medicine

## 2017-07-19 ENCOUNTER — Ambulatory Visit (HOSPITAL_COMMUNITY)
Admission: RE | Admit: 2017-07-19 | Discharge: 2017-07-19 | Disposition: A | Discharge status: Home / Self Care | Payer: Medicare Other | Source: Ambulatory Visit | Attending: Internal Medicine | Admitting: Internal Medicine

## 2017-07-19 DIAGNOSIS — Z7982 Long term (current) use of aspirin: Secondary | ICD-10-CM | POA: Diagnosis not present

## 2017-07-19 DIAGNOSIS — Z79899 Other long term (current) drug therapy: Secondary | ICD-10-CM | POA: Insufficient documentation

## 2017-07-19 DIAGNOSIS — Z1211 Encounter for screening for malignant neoplasm of colon: Secondary | ICD-10-CM | POA: Diagnosis not present

## 2017-07-19 DIAGNOSIS — Z538 Procedure and treatment not carried out for other reasons: Secondary | ICD-10-CM | POA: Diagnosis not present

## 2017-07-19 DIAGNOSIS — I10 Essential (primary) hypertension: Secondary | ICD-10-CM | POA: Insufficient documentation

## 2017-07-19 DIAGNOSIS — F419 Anxiety disorder, unspecified: Secondary | ICD-10-CM | POA: Insufficient documentation

## 2017-07-19 DIAGNOSIS — D123 Benign neoplasm of transverse colon: Secondary | ICD-10-CM | POA: Insufficient documentation

## 2017-07-19 DIAGNOSIS — F17210 Nicotine dependence, cigarettes, uncomplicated: Secondary | ICD-10-CM | POA: Insufficient documentation

## 2017-07-19 DIAGNOSIS — J449 Chronic obstructive pulmonary disease, unspecified: Secondary | ICD-10-CM | POA: Diagnosis not present

## 2017-07-19 DIAGNOSIS — F319 Bipolar disorder, unspecified: Secondary | ICD-10-CM | POA: Insufficient documentation

## 2017-07-19 DIAGNOSIS — Z800 Family history of malignant neoplasm of digestive organs: Secondary | ICD-10-CM | POA: Insufficient documentation

## 2017-07-19 DIAGNOSIS — D539 Nutritional anemia, unspecified: Secondary | ICD-10-CM

## 2017-07-19 HISTORY — PX: POLYPECTOMY: SHX5525

## 2017-07-19 HISTORY — PX: COLONOSCOPY WITH PROPOFOL: SHX5780

## 2017-07-19 SURGERY — COLONOSCOPY WITH PROPOFOL
Anesthesia: Monitor Anesthesia Care

## 2017-07-19 MED ORDER — MIDAZOLAM HCL 2 MG/2ML IJ SOLN
INTRAMUSCULAR | Status: AC
  Filled 2017-07-19: qty 2

## 2017-07-19 MED ORDER — LACTATED RINGERS IV SOLN
INTRAVENOUS | Status: DC | PRN
Start: 2017-07-19 — End: 2017-07-19
  Administered 2017-07-19: 12:00:00 via INTRAVENOUS

## 2017-07-19 MED ORDER — CHLORHEXIDINE GLUCONATE CLOTH 2 % EX PADS: 6.0000 | MEDICATED_PAD | Freq: Once | CUTANEOUS | Status: DC

## 2017-07-19 MED ORDER — MIDAZOLAM HCL 5 MG/5ML IJ SOLN
INTRAMUSCULAR | Status: DC | PRN
  Administered 2017-07-19 (×2): 1 mg via INTRAVENOUS

## 2017-07-19 MED ORDER — LACTATED RINGERS IV SOLN
INTRAVENOUS | Status: DC
  Administered 2017-07-19: 1000 mL via INTRAVENOUS

## 2017-07-19 MED ORDER — PROPOFOL 500 MG/50ML IV EMUL
INTRAVENOUS | Status: DC | PRN
  Administered 2017-07-19: 100 ug/kg/min via INTRAVENOUS
  Administered 2017-07-19: 15:00:00 via INTRAVENOUS

## 2017-07-19 NOTE — Anesthesia Preprocedure Evaluation (Signed)
Anesthesia Evaluation  Patient identified by MRN, date of birth, ID band Patient awake    Reviewed: Allergy & Precautions, H&P , NPO status , Patient's Chart, lab work & pertinent test results, reviewed documented beta blocker date and time   Airway Mallampati: II  TM Distance: >3 FB Neck ROM: full    Dental no notable dental hx.    Pulmonary neg pulmonary ROS, asthma , COPD, Current Smoker,    Pulmonary exam normal breath sounds clear to auscultation       Cardiovascular Exercise Tolerance: Good hypertension, negative cardio ROS   Rhythm:regular Rate:Normal     Neuro/Psych Seizures -,  PSYCHIATRIC DISORDERS Anxiety Bipolar Disorder negative neurological ROS  negative psych ROS   GI/Hepatic negative GI ROS, Neg liver ROS,   Endo/Other  negative endocrine ROS  Renal/GU negative Renal ROS  negative genitourinary   Musculoskeletal   Abdominal   Peds  Hematology negative hematology ROS (+) anemia ,   Anesthesia Other Findings   Reproductive/Obstetrics negative OB ROS                             Anesthesia Physical Anesthesia Plan  ASA: III  Anesthesia Plan: MAC   Post-op Pain Management:    Induction:   PONV Risk Score and Plan:   Airway Management Planned:   Additional Equipment:   Intra-op Plan:   Post-operative Plan:   Informed Consent: I have reviewed the patients History and Physical, chart, labs and discussed the procedure including the risks, benefits and alternatives for the proposed anesthesia with the patient or authorized representative who has indicated his/her understanding and acceptance.   Dental Advisory Given  Plan Discussed with: CRNA  Anesthesia Plan Comments:         Anesthesia Quick Evaluation

## 2017-07-19 NOTE — Discharge Instructions (Signed)
Colonoscopy Discharge Instructions  Read the instructions outlined below and refer to this sheet in the next few weeks. These discharge instructions provide you with general information on caring for yourself after you leave the hospital. Your doctor may also give you specific instructions. While your treatment has been planned according to the most current medical practices available, unavoidable complications occasionally occur. If you have any problems or questions after discharge, call Dr. Gala Romney at [phone]. ACTIVITY  You may resume your regular activity, but move at a slower pace for the next 24 hours.   Take frequent rest periods for the next 24 hours.   Walking will help get rid of the air and reduce the bloated feeling in your belly (abdomen).   No driving for 24 hours (because of the medicine (anesthesia) used during the test).    Do not sign any important legal documents or operate any machinery for 24 hours (because of the anesthesia used during the test).  NUTRITION  Drink plenty of fluids.   You may resume your normal diet as instructed by your doctor.   Begin with a light meal and progress to your normal diet. Heavy or fried foods are harder to digest and may make you feel sick to your stomach (nauseated).   Avoid alcoholic beverages for 24 hours or as instructed.  MEDICATIONS  You may resume your normal medications unless your doctor tells you otherwise.  WHAT YOU CAN EXPECT TODAY  Some feelings of bloating in the abdomen.   Passage of more gas than usual.   Spotting of blood in your stool or on the toilet paper.  IF YOU HAD POLYPS REMOVED DURING THE COLONOSCOPY:  No aspirin products for 7 days or as instructed.   No alcohol for 7 days or as instructed.   Eat a soft diet for the next 24 hours.  FINDING OUT THE RESULTS OF YOUR TEST Not all test results are available during your visit. If your test results are not back during the visit, make an appointment  with your caregiver to find out the results. Do not assume everything is normal if you have not heard from your caregiver or the medical facility. It is important for you to follow up on all of your test results.  SEEK IMMEDIATE MEDICAL ATTENTION IF:  You have more than a spotting of blood in your stool.   Your belly is swollen (abdominal distention).   You are nauseated or vomiting.   You have a temperature over 101.   You have abdominal pain or discomfort that is severe or gets worse throughout the day.   Your colon was poorly prepped. It could not all be seen. A polyp was removed.  Office visit with [place] in 6 weeks  Further recommendations to follow pending review of pathology report    PATIENT INSTRUCTIONS POST-ANESTHESIA  IMMEDIATELY FOLLOWING SURGERY:  Do not drive or operate machinery for the first twenty four hours after surgery.  Do not make any important decisions for twenty four hours after surgery or while taking narcotic pain medications or sedatives.  If you develop intractable nausea and vomiting or a severe headache please notify your doctor immediately.  FOLLOW-UP:  Please make an appointment with your surgeon as instructed. You do not need to follow up with anesthesia unless specifically instructed to do so.  WOUND CARE INSTRUCTIONS (if applicable):  Keep a dry clean dressing on the anesthesia/puncture wound site if there is drainage.  Once the wound has quit draining  you may leave it open to air.  Generally you should leave the bandage intact for twenty four hours unless there is drainage.  If the epidural site drains for more than 36-48 hours please call the anesthesia department.  QUESTIONS?:  Please feel free to call your physician or the hospital operator if you have any questions, and they will be happy to assist you.

## 2017-07-19 NOTE — Anesthesia Postprocedure Evaluation (Signed)
Anesthesia Post Note  Patient: Karen Keller  Procedure(s) Performed: COLONOSCOPY WITH PROPOFOL (N/A ) POLYPECTOMY  Patient location during evaluation: PACU Anesthesia Type: MAC Level of consciousness: awake and patient cooperative Pain management: pain level controlled Vital Signs Assessment: post-procedure vital signs reviewed and stable Respiratory status: spontaneous breathing, nonlabored ventilation and respiratory function stable Cardiovascular status: blood pressure returned to baseline Postop Assessment: no apparent nausea or vomiting Anesthetic complications: no     Last Vitals:  Vitals:   07/19/17 1156  BP: (!) 149/65  Pulse: (!) 55  Temp: 36.5 C  SpO2: 94%    Last Pain:  Vitals:   07/19/17 1156  TempSrc: Oral  PainSc: 0-No pain                 Marcele Kosta J

## 2017-07-19 NOTE — Transfer of Care (Signed)
Immediate Anesthesia Transfer of Care Note  Patient: Karen Keller  Procedure(s) Performed: COLONOSCOPY WITH PROPOFOL (N/A ) POLYPECTOMY  Patient Location: PACU  Anesthesia Type:MAC  Level of Consciousness: awake and patient cooperative  Airway & Oxygen Therapy: Patient Spontanous Breathing and Patient connected to nasal cannula oxygen  Post-op Assessment: Report given to RN, Post -op Vital signs reviewed and stable and Patient moving all extremities  Post vital signs: Reviewed and stable  Last Vitals:  Vitals Value Taken Time  BP    Temp    Pulse 54 07/19/2017  3:03 PM  Resp    SpO2 72 % 07/19/2017  3:03 PM  Vitals shown include unvalidated device data.  Last Pain:  Vitals:   07/19/17 1156  TempSrc: Oral  PainSc: 0-No pain         Complications: No apparent anesthesia complications
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
## 2017-07-19 NOTE — Op Note (Signed)
[facility] Patient Name: Karen Keller Procedure Date: 07/19/2017 2:12 PM MRN: [id number] Date of Birth: Mar 08, 1956 Attending MD: Norvel Richards , MD CSN: [id number] Age: 62 Admit Type: Outpatient Procedure:                Colonoscopy Indications:              Screening for colorectal malignant neoplasm Providers:                Norvel Richards, MD, Lurline Del, RN, Aram Candela Referring MD:              Medicines:                Propofol per Anesthesia Complications:            No immediate complications. Estimated Blood Loss:     Estimated blood loss was minimal. Procedure:                After obtaining informed consent, the colonoscope                            was passed under direct vision. Throughout the                            procedure, the patient's blood pressure, pulse, and                            oxygen saturations were monitored continuously. The                            EC-[id number] ([id number]) scope was introduced through                            the and advanced to the the cecum, identified by                            appendiceal orifice and ileocecal valve. The                            colonoscopy was performed without difficulty. The                            patient tolerated the procedure well. The quality                            of the bowel preparation was adequate. The                            ileocecal valve, appendiceal orifice, and rectum                            were photographed. The colonoscopy was performed  without difficulty. The patient tolerated the                            procedure well. The entire colon was well                            visualized. Scope In: 2:33:00 PM Scope Out: 2:53:46 PM Scope Withdrawal Time: 0 hours 7 minutes 21 seconds  Total Procedure Duration: 0 hours 20 minutes 46 seconds  Findings:      GROSSLY INADEQUATE PREPARATION.  MUCH OF THE COLONIC MUCOSA NOT SEEN WELL       TODAY      The perianal and digital rectal examinations were normal.      A 8 mm polyp was found in the transverse colon. The polyp was       pedunculated. The polyp was removed with a cold snare. Resection and       retrieval were complete.      The exam was otherwise without abnormality on direct and retroflexion       views. No other abnormalities althougha a significant lesion could       remain, obscured by preparation. Impression:               - One 8 mm polyp in the transverse colon, removed                            with a cold snare. Resected and retrieved. Biopsied.                           - The examination was otherwise normal on direct                            and retroflexion views. Inadequate preparation. Moderate Sedation:      Moderate (conscious) sedation was personally administered by an       anesthesia professional. The following parameters were monitored: oxygen       saturation, heart rate, blood pressure, respiratory rate, EKG, adequacy       of pulmonary ventilation, and response to care. Total physician       intraservice time was 34 minutes. Recommendation:           - Patient has a contact number available for                            emergencies. The signs and symptoms of potential                            delayed complications were discussed with the                            patient. Return to normal activities tomorrow.                            Written discharge instructions were provided to the                            patient.                           -  Advance diet as tolerated.                           - Continue present medications.                           - Repeat colonoscopy after studies are complete for                            surveillance based on pathology results.                           - Return to GI office in 6 weeks. Procedure Code(s):        --- Professional ---                            [phone], Colonoscopy, flexible; with removal of                            tumor(s), polyp(s), or other lesion(s) by snare                            technique Diagnosis Code(s):        --- Professional ---                           Z12.11, Encounter for screening for malignant                            neoplasm of colon                           D12.3, Benign neoplasm of transverse colon (hepatic                            flexure or splenic flexure) CPT copyright 2017 American Medical Association. All rights reserved. The codes documented in this report are preliminary and upon coder review may  be revised to meet current compliance requirements. [name]. Rowen Hur, MD Norvel Richards, MD 07/19/2017 3:59:06 PM This report has been signed electronically. Number of Addenda: 0

## 2017-07-22 ENCOUNTER — Encounter (HOSPITAL_COMMUNITY): Payer: Self-pay | Admitting: Internal Medicine

## 2017-07-24 ENCOUNTER — Encounter: Payer: Self-pay | Admitting: Internal Medicine

## 2017-07-28 DIAGNOSIS — G8314 Monoplegia of lower limb affecting left nondominant side: Secondary | ICD-10-CM | POA: Diagnosis not present

## 2017-07-28 DIAGNOSIS — M79601 Pain in right arm: Secondary | ICD-10-CM | POA: Diagnosis not present

## 2017-07-28 DIAGNOSIS — G2111 Neuroleptic induced parkinsonism: Secondary | ICD-10-CM | POA: Diagnosis not present

## 2017-07-29 ENCOUNTER — Other Ambulatory Visit: Payer: Self-pay | Admitting: Neurology

## 2017-08-20 DIAGNOSIS — F419 Anxiety disorder, unspecified: Secondary | ICD-10-CM | POA: Diagnosis not present

## 2017-08-20 DIAGNOSIS — F250 Schizoaffective disorder, bipolar type: Secondary | ICD-10-CM | POA: Diagnosis not present

## 2017-09-02 ENCOUNTER — Ambulatory Visit: Payer: Medicare Other | Admitting: Gastroenterology

## 2017-09-02 DIAGNOSIS — F250 Schizoaffective disorder, bipolar type: Secondary | ICD-10-CM | POA: Diagnosis not present

## 2017-09-02 DIAGNOSIS — F419 Anxiety disorder, unspecified: Secondary | ICD-10-CM | POA: Diagnosis not present

## 2017-09-10 ENCOUNTER — Ambulatory Visit: Payer: Medicare Other | Admitting: Gastroenterology

## 2017-09-15 ENCOUNTER — Other Ambulatory Visit: Payer: Self-pay | Admitting: Gastroenterology

## 2017-09-30 ENCOUNTER — Ambulatory Visit (HOSPITAL_COMMUNITY)
Admission: RE | Admit: 2017-09-30 | Discharge: 2017-09-30 | Disposition: A | Discharge status: Home / Self Care | Payer: Medicare Other | Source: Ambulatory Visit | Attending: Internal Medicine | Admitting: Internal Medicine

## 2017-09-30 ENCOUNTER — Other Ambulatory Visit (HOSPITAL_COMMUNITY): Payer: Self-pay | Admitting: Internal Medicine

## 2017-09-30 DIAGNOSIS — F319 Bipolar disorder, unspecified: Secondary | ICD-10-CM | POA: Diagnosis not present

## 2017-09-30 DIAGNOSIS — R05 Cough: Secondary | ICD-10-CM | POA: Diagnosis not present

## 2017-09-30 DIAGNOSIS — J449 Chronic obstructive pulmonary disease, unspecified: Secondary | ICD-10-CM

## 2017-09-30 DIAGNOSIS — J181 Lobar pneumonia, unspecified organism: Secondary | ICD-10-CM | POA: Diagnosis not present

## 2017-09-30 DIAGNOSIS — F17200 Nicotine dependence, unspecified, uncomplicated: Secondary | ICD-10-CM | POA: Diagnosis not present

## 2017-09-30 DIAGNOSIS — F17210 Nicotine dependence, cigarettes, uncomplicated: Secondary | ICD-10-CM | POA: Diagnosis not present

## 2017-09-30 DIAGNOSIS — R0602 Shortness of breath: Secondary | ICD-10-CM | POA: Diagnosis not present

## 2017-09-30 DIAGNOSIS — J440 Chronic obstructive pulmonary disease with acute lower respiratory infection: Secondary | ICD-10-CM | POA: Diagnosis not present

## 2017-09-30 DIAGNOSIS — J441 Chronic obstructive pulmonary disease with (acute) exacerbation: Secondary | ICD-10-CM | POA: Diagnosis not present

## 2017-10-04 DIAGNOSIS — J189 Pneumonia, unspecified organism: Secondary | ICD-10-CM | POA: Diagnosis not present

## 2017-10-04 DIAGNOSIS — J449 Chronic obstructive pulmonary disease, unspecified: Secondary | ICD-10-CM | POA: Diagnosis not present

## 2017-10-25 ENCOUNTER — Other Ambulatory Visit: Payer: Self-pay | Admitting: Neurology

## 2017-10-25 DIAGNOSIS — G20 Parkinson's disease: Secondary | ICD-10-CM | POA: Diagnosis not present

## 2017-10-25 DIAGNOSIS — Z79899 Other long term (current) drug therapy: Secondary | ICD-10-CM | POA: Diagnosis not present

## 2017-10-25 DIAGNOSIS — R29898 Other symptoms and signs involving the musculoskeletal system: Secondary | ICD-10-CM

## 2017-10-25 DIAGNOSIS — M79601 Pain in right arm: Secondary | ICD-10-CM | POA: Diagnosis not present

## 2017-10-25 DIAGNOSIS — R269 Unspecified abnormalities of gait and mobility: Secondary | ICD-10-CM

## 2017-10-25 DIAGNOSIS — G2111 Neuroleptic induced parkinsonism: Secondary | ICD-10-CM | POA: Diagnosis not present

## 2017-11-03 ENCOUNTER — Ambulatory Visit (HOSPITAL_COMMUNITY)
Admission: RE | Admit: 2017-11-03 | Discharge: 2017-11-03 | Disposition: A | Discharge status: Home / Self Care | Payer: Medicare Other | Source: Ambulatory Visit | Attending: Neurology | Admitting: Neurology

## 2017-11-03 DIAGNOSIS — M1288 Other specific arthropathies, not elsewhere classified, other specified site: Secondary | ICD-10-CM | POA: Diagnosis not present

## 2017-11-03 DIAGNOSIS — Z981 Arthrodesis status: Secondary | ICD-10-CM | POA: Diagnosis not present

## 2017-11-03 DIAGNOSIS — M4802 Spinal stenosis, cervical region: Secondary | ICD-10-CM | POA: Insufficient documentation

## 2017-11-03 DIAGNOSIS — G960 Cerebrospinal fluid leak: Secondary | ICD-10-CM | POA: Diagnosis not present

## 2017-11-03 DIAGNOSIS — G8314 Monoplegia of lower limb affecting left nondominant side: Secondary | ICD-10-CM | POA: Diagnosis not present

## 2017-11-03 DIAGNOSIS — M50223 Other cervical disc displacement at C6-C7 level: Secondary | ICD-10-CM | POA: Diagnosis not present

## 2017-11-03 DIAGNOSIS — R29898 Other symptoms and signs involving the musculoskeletal system: Secondary | ICD-10-CM

## 2017-11-03 DIAGNOSIS — R269 Unspecified abnormalities of gait and mobility: Secondary | ICD-10-CM

## 2017-11-03 DIAGNOSIS — R51 Headache: Secondary | ICD-10-CM | POA: Diagnosis not present

## 2017-11-12 ENCOUNTER — Encounter

## 2017-11-12 ENCOUNTER — Ambulatory Visit (INDEPENDENT_AMBULATORY_CARE_PROVIDER_SITE_OTHER): Payer: Medicare Other | Admitting: Gastroenterology

## 2017-11-12 ENCOUNTER — Encounter: Payer: Self-pay | Admitting: Gastroenterology

## 2017-11-12 VITALS — BP 131/79 | HR 61 | Temp 97.4°F | Ht 68.0 in | Wt 149.8 lb

## 2017-11-12 DIAGNOSIS — R945 Abnormal results of liver function studies: Secondary | ICD-10-CM

## 2017-11-12 DIAGNOSIS — Z79899 Other long term (current) drug therapy: Secondary | ICD-10-CM

## 2017-11-12 DIAGNOSIS — B999 Unspecified infectious disease: Secondary | ICD-10-CM | POA: Diagnosis not present

## 2017-11-12 DIAGNOSIS — K5900 Constipation, unspecified: Secondary | ICD-10-CM | POA: Diagnosis not present

## 2017-11-12 DIAGNOSIS — Z114 Encounter for screening for human immunodeficiency virus [HIV]: Secondary | ICD-10-CM

## 2017-11-12 DIAGNOSIS — K769 Liver disease, unspecified: Secondary | ICD-10-CM

## 2017-11-12 DIAGNOSIS — R7989 Other specified abnormal findings of blood chemistry: Secondary | ICD-10-CM

## 2017-11-12 DIAGNOSIS — Z7251 High risk heterosexual behavior: Secondary | ICD-10-CM

## 2017-11-12 DIAGNOSIS — B182 Chronic viral hepatitis C: Secondary | ICD-10-CM | POA: Insufficient documentation
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
## 2017-11-12 NOTE — Patient Instructions (Signed)
1. Please go to [facility] for your labs today. 2. We will need to get you set up for another colonoscopy in near future. We will call and make arrangements.    Hepatitis C Hepatitis C is a viral infection of the liver. It can lead to scarring of the liver (cirrhosis), liver failure, or liver cancer. Hepatitis C may go undetected for months or years because people with the infection may not have symptoms, or they may have only mild symptoms. What are the causes? This condition is caused by the hepatitis C virus (HCV). The virus can spread from person to person (is contagious) through:  Blood.  Childbirth. A woman who has hepatitis C can pass it to her baby during birth.  Bodily fluids, such as breast milk, tears, semen, vaginal fluids, and saliva.  Blood transfusions or organ transplants done in the [place] before 1992.  What increases the risk? The following factors may make you more likely to develop this condition:  Having contact with unclean (contaminated) needles or syringes. This may result from: ? Acupuncture. ? Tattoing. ? Body piercing. ? Injecting drugs.  Having unprotected sex with someone who is infected.  Needing treatment to filter your blood (kidney dialysis).  Having HIV (human immunodeficiency virus) or AIDS (acquired immunodeficiency syndrome).  Working in a job that involves contact with blood or bodily fluids, such as health care.  What are the signs or symptoms? Symptoms of this condition include:  Fatigue.  Loss of appetite.  Nausea.  Vomiting.  Abdominal pain.  Dark yellow urine.  Yellowish skin and eyes (jaundice).  Itchy skin.  Clay-colored bowel movements.  Joint pain.  Bleeding and bruising easily.  Fluid building up in your stomach (ascites).  In some cases, you may not have any symptoms. How is this diagnosed? This condition is diagnosed with:  Blood tests.  Other tests to check how well your liver is functioning.  They may include: ? Magnetic resonance elastography (MRE). This imaging test uses MRIs and sound waves to measure liver stiffness. ? Transient elastography. This imaging test uses ultrasounds to measure liver stiffness. ? Liver biopsy. This test requires taking a small tissue sample from your liver to examine it under a microscope.  How is this treated? Your health care provider may perform noninvasive tests or a liver biopsy to help decide the best course of treatment. Treatment may include:  Antiviral medicines and other medicines.  Follow-up treatments every 6-12 months for infections or other liver conditions.  Receiving a donated liver (liver transplant).  Follow these instructions at home: Medicines  Take over-the-counter and prescription medicines only as told by your health care provider.  Take your antiviral medicine as told by your health care provider. Do not stop taking the antiviral even if you start to feel better.  Do not take any medicines unless approved by your health care provider, including over-the-counter medicines and birth control pills. Activity  Rest as needed.  Do not have sex unless approved by your health care provider.  Ask your health care provider when you may return to school or work. Eating and drinking  Eat a balanced diet with plenty of fruits and vegetables, whole grains, and lowfat (lean) meats or non-meat proteins (such as beans or tofu).  Drink enough fluids to keep your urine clear or pale yellow.  Do not drink alcohol. General instructions  Do not share toothbrushes, nail clippers, or razors.  Wash your hands frequently with soap and water. If soap and  water are not available, use hand sanitizer.  Cover any cuts or open sores on your skin to prevent spreading the virus.  Keep all follow-up visits as told by your health care provider. This is important. You may need follow-up visits every 6-12 months. How is this prevented? There  is no vaccine for hepatitis C. The only way to prevent the disease is to reduce the risk of exposure to the virus. Make sure you:  Wash your hands frequently with soap and water. If soap and water are not available, use hand sanitizer.  Do not share needles or syringes.  Practice safe sex and use condoms.  Avoid handling blood or bodily fluids without gloves or other protection.  Avoid getting tattoos or piercings in shops or other locations that are not clean.  Contact a health care provider if:  You have a fever.  You develop abdominal pain.  You pass dark urine.  You pass clay-colored stools.  You develop joint pain. Get help right away if:  You have increasing fatigue or weakness.  You lose your appetite.  You cannot eat or drink without vomiting.  You develop jaundice or your jaundice gets worse.  You bruise or bleed easily. Summary  Hepatitis C is a viral infection of the liver. It can lead to scarring of the liver (cirrhosis), liver failure, or liver cancer.  The hepatitis C virus (HCV) causes this condition. The virus can pass from person to person (is contagious).  You should not take any medicines unless approved by your health care provider. This includes over-the-counter medicines and birth control pills. This information is not intended to replace advice given to you by your health care provider. Make sure you discuss any questions you have with your health care provider. Document Released: 02/21/2000 Document Revised: 03/31/2016 Document Reviewed: 03/31/2016 Elsevier Interactive Patient Education  [employer].

## 2017-11-12 NOTE — Assessment & Plan Note (Signed)
62 year old female residing in a group home who presents for further treatment of hepatitis C.  She developed new onset abnormal LFTs earlier this year.  She had a positive HCVRNA in April.  We will be updating labs in the near future including genotype.  If we are able to genotype her, clearly she has had this for more than 6 months and would warrant treatment.  Will run FibroSure testing given ultrasound and MRI have already been done.  No evidence of cirrhosis.  Check hepatitis A and B immune status.  Screen for HIV.  Patient desires hepatitis C treatment, given she resides in a group home and is administered her daily medications there should not be a compliance issue.  We have requested group home to fax [place] current complete medication list for our records.  List of medications provided today in today's visit may be inaccurate.

## 2017-11-12 NOTE — Assessment & Plan Note (Addendum)
We will make some changes once we get a copy of her current medication list.  Continues to complain of bowel movement every 3 to 4 days with [facility] 1-3.  She has now had 2 failed attempts for colonoscopy since 2017.  I discussed with Dr. Gala Romney, he did recommend patient have another colonoscopy as her colon has not been adequately seen yet.  She also had a tubular adenoma removed last time he cannot exclude other polyps being present given inadequate bowel prep.  It is not clear why the patient did not have a good bowel prep, she seems reasonably responsible and states that she did everything like she was supposed to.  I will discuss further with Dr. Gala Romney regarding whether we should attempt bowel preparation as an inpatient.  Further recommendations to follow.

## 2017-11-15 NOTE — Progress Notes (Signed)
CC'D TO PCP °

## 2017-11-16 LAB — COMPREHENSIVE METABOLIC PANEL WITH GFR
ALT: 37 IU/L — ABNORMAL HIGH (ref 0–32)
AST: 56 IU/L — ABNORMAL HIGH (ref 0–40)
Albumin/Globulin Ratio: 1.6 (ref 1.2–2.2)
Albumin: 4.2 g/dL (ref 3.6–4.8)
Alkaline Phosphatase: 115 IU/L (ref 39–117)
BUN/Creatinine Ratio: 17 (ref 12–28)
BUN: 17 mg/dL (ref 8–27)
Bilirubin Total: 0.2 mg/dL (ref 0.0–1.2)
CO2: 21 mmol/L (ref 20–29)
Calcium: 11.1 mg/dL — ABNORMAL HIGH (ref 8.7–10.3)
Chloride: 106 mmol/L (ref 96–106)
Creatinine, Ser: 0.99 mg/dL (ref 0.57–1.00)
GFR calc Af Amer: 70 mL/min/1.73 (ref >59)
GFR calc non Af Amer: 60 mL/min/1.73 (ref >59)
Globulin, Total: 2.6 g/dL (ref 1.5–4.5)
Glucose: 93 mg/dL (ref 65–99)
Potassium: 4.9 mmol/L (ref 3.5–5.2)
Sodium: 142 mmol/L (ref 134–144)
Total Protein: 6.8 g/dL (ref 6.0–8.5)

## 2017-11-16 LAB — CBC WITH DIFFERENTIAL/PLATELET
Basophils Absolute: 0 x10E3/uL (ref 0.0–0.2)
Basos: 1 %
EOS (ABSOLUTE): 0.2 x10E3/uL (ref 0.0–0.4)
Eos: 3 %
Hematocrit: 37.5 % (ref 34.0–46.6)
Hemoglobin: 12.9 g/dL (ref 11.1–15.9)
Immature Grans (Abs): 0 x10E3/uL (ref 0.0–0.1)
Immature Granulocytes: 1 %
Lymphocytes Absolute: 2.2 x10E3/uL (ref 0.7–3.1)
Lymphs: 33 %
MCH: 35.3 pg — ABNORMAL HIGH (ref 26.6–33.0)
MCHC: 34.4 g/dL (ref 31.5–35.7)
MCV: 103 fL — ABNORMAL HIGH (ref 79–97)
Monocytes Absolute: 0.7 x10E3/uL (ref 0.1–0.9)
Monocytes: 11 %
Neutrophils Absolute: 3.5 x10E3/uL (ref 1.4–7.0)
Neutrophils: 51 %
Platelets: 200 x10E3/uL (ref 150–450)
RBC: 3.65 x10E6/uL — ABNORMAL LOW (ref 3.77–5.28)
RDW: 12 % — ABNORMAL LOW (ref 12.3–15.4)
WBC: 6.7 x10E3/uL (ref 3.4–10.8)

## 2017-11-16 LAB — HEPATITIS C GENOTYPE: Hepatitis C Genotype: 3

## 2017-11-16 LAB — HCV FIBROSURE
ALPHA 2-MACROGLOBULINS, QN: 273 mg/dL (ref 110–276)
ALT (SGPT) P5P: 36 IU/L (ref 0–40)
Apolipoprotein A-1: 157 mg/dL (ref 116–209)
Bilirubin, Total: 0.2 mg/dL (ref 0.0–1.2)
Fibrosis Score: 0.28 — ABNORMAL HIGH (ref 0.00–0.21)
GGT: 101 IU/L — ABNORMAL HIGH (ref 0–60)
Haptoglobin: 161 mg/dL (ref 34–200)
Necroinflammat Activity Score: 0.18 — ABNORMAL HIGH (ref 0.00–0.17)

## 2017-11-16 LAB — PROTIME-INR
INR: 1 (ref 0.8–1.2)
Prothrombin Time: 10.9 s (ref 9.1–12.0)

## 2017-11-16 LAB — HEPATITIS B SURFACE ANTIBODY,QUALITATIVE: Hep B Surface Ab, Qual: NONREACTIVE

## 2017-11-16 LAB — HIV ANTIBODY (ROUTINE TESTING W REFLEX): HIV Screen 4th Generation wRfx: NONREACTIVE

## 2017-11-16 LAB — HEPATITIS A ANTIBODY, TOTAL: Hep A Total Ab: NEGATIVE

## 2017-11-26 DIAGNOSIS — F419 Anxiety disorder, unspecified: Secondary | ICD-10-CM | POA: Diagnosis not present

## 2017-11-26 DIAGNOSIS — F250 Schizoaffective disorder, bipolar type: Secondary | ICD-10-CM | POA: Diagnosis not present

## 2017-11-30 NOTE — Progress Notes (Signed)
CC'D TO PCP °

## 2017-12-06 ENCOUNTER — Telehealth: Payer: Self-pay

## 2017-12-06 NOTE — Telephone Encounter (Signed)
Carin Primrose stopped by pt pick up pts script for Hep A & B vaccination. He was asked about the medication list for pt. He stated that he was going to bring it back to [place].

## 2017-12-08 ENCOUNTER — Ambulatory Visit: Payer: Medicare Other | Admitting: Podiatry

## 2017-12-10 ENCOUNTER — Other Ambulatory Visit (HOSPITAL_COMMUNITY): Payer: Self-pay | Admitting: Internal Medicine

## 2017-12-10 DIAGNOSIS — Z1231 Encounter for screening mammogram for malignant neoplasm of breast: Secondary | ICD-10-CM

## 2017-12-21 ENCOUNTER — Telehealth: Payer: Self-pay | Admitting: Gastroenterology

## 2017-12-21 NOTE — Progress Notes (Signed)
I spoke with [facility] and she will fax med list tonight.

## 2017-12-21 NOTE — Progress Notes (Signed)
I spoke with [facility] and she is going to fax the med list tonight when she returns to the facility.

## 2017-12-21 NOTE — Telephone Encounter (Signed)
Dr. Gala Romney recommends inpatient bowel prep for colonoscopy due to two failed attempts as outpatient. She has h/o tubular adenomas and has not been able to have adequate surveillance.   Before we schedule, we need updated med list from group home as they did not bring her med list the day of her 11/2017 OV. Rosendo Gros is working on this, see lab result note.

## 2017-12-27 ENCOUNTER — Ambulatory Visit (HOSPITAL_COMMUNITY): Payer: Medicare Other

## 2017-12-27 NOTE — Telephone Encounter (Signed)
I spoke with Karen Keller again and she stated she was going to get with her son and call me back.

## 2017-12-27 NOTE — Telephone Encounter (Signed)
I called [facility] back again and she stated she is going to have her son drop off the med list today at our office.

## 2017-12-27 NOTE — Telephone Encounter (Signed)
I received the med list and placed it on LLs desk

## 2017-12-27 NOTE — Telephone Encounter (Signed)
Can someone please give update.   I saw patient 11/12/17. I personally have not see med list.

## 2017-12-28 NOTE — Addendum Note (Signed)
Addended by: Mahala Menghini on: 12/28/2017 08:10 AM   Modules accepted: Orders

## 2017-12-28 NOTE — Progress Notes (Signed)
Addendum to update med list.

## 2017-12-30 ENCOUNTER — Ambulatory Visit (INDEPENDENT_AMBULATORY_CARE_PROVIDER_SITE_OTHER): Payer: Medicare Other | Admitting: Podiatry

## 2017-12-30 DIAGNOSIS — L853 Xerosis cutis: Secondary | ICD-10-CM

## 2017-12-30 DIAGNOSIS — M79674 Pain in right toe(s): Secondary | ICD-10-CM

## 2017-12-30 DIAGNOSIS — B351 Tinea unguium: Secondary | ICD-10-CM

## 2017-12-30 DIAGNOSIS — M79675 Pain in left toe(s): Secondary | ICD-10-CM

## 2017-12-30 NOTE — Patient Instructions (Signed)
For dry skin, purchase Dermasil Lotion at [employer]: apply lotion to both feet once daily avoiding application between toes

## 2018-01-05 DIAGNOSIS — N3941 Urge incontinence: Secondary | ICD-10-CM | POA: Diagnosis not present

## 2018-01-05 DIAGNOSIS — I10 Essential (primary) hypertension: Secondary | ICD-10-CM | POA: Diagnosis not present

## 2018-01-05 DIAGNOSIS — Z23 Encounter for immunization: Secondary | ICD-10-CM | POA: Diagnosis not present

## 2018-01-05 DIAGNOSIS — F319 Bipolar disorder, unspecified: Secondary | ICD-10-CM | POA: Diagnosis not present

## 2018-01-05 DIAGNOSIS — F17200 Nicotine dependence, unspecified, uncomplicated: Secondary | ICD-10-CM | POA: Diagnosis not present

## 2018-01-10 NOTE — Telephone Encounter (Signed)
OK to schedule inpatient colonoscopy for assistance with bowel prep. H/O tubular adenomas and poor bowel prep twice as outpatient.   She will need PROPOFOL.   She will present to hospital 8am the day BEFORE her procedure for inpatient 24 hour observation.   Start clear liquids TWO days before procedure (so she will complete one full day of clear liquids the day before hospital admission.   Please let me know date when it is set up. I will contact hospitalist the day before admission.

## 2018-01-10 NOTE — Telephone Encounter (Signed)
Called [employer] (caregiver). TCS w/Propofol w/RMR scheduled for 02/17/18 at 10:00am. Pt to arrive at 8:00am for admission 02/16/18. Instructions mailed to start clear liquids 02/15/18. Informed endo scheduler.

## 2018-01-10 NOTE — Progress Notes (Signed)
Med list updated and addendum to note made.

## 2018-01-12 ENCOUNTER — Ambulatory Visit (HOSPITAL_COMMUNITY)
Admission: RE | Admit: 2018-01-12 | Discharge: 2018-01-12 | Disposition: A | Discharge status: Home / Self Care | Payer: Medicare Other | Source: Ambulatory Visit | Attending: Internal Medicine | Admitting: Internal Medicine

## 2018-01-12 DIAGNOSIS — Z1231 Encounter for screening mammogram for malignant neoplasm of breast: Secondary | ICD-10-CM | POA: Insufficient documentation

## 2018-01-15 ENCOUNTER — Encounter: Payer: Self-pay | Admitting: Podiatry

## 2018-01-15 NOTE — Progress Notes (Signed)
Subjective: "My feet hurt because these nails are too long." Ahonesty Nuss presents today with  cc of painful, discolored, thick toenails which interfere with daily activities and routine tasks.  Pain is aggravated when wearing enclosed shoe gear. Pain is getting progressively worse and relieved with periodic professional debridement.  Objective: Vascular Examination: Capillary refill time immediate x 10 digits Dorsalis pedis palpable b/l Posterior tibial pulses nonpalpable b/l No digital hair x 10 digits Skin temperature warm to warm b/l  Dermatological Examination: Dry, flaky skin b/l LE No open wounds b/l No interdigital maceration b/l Skin thin and atrophic b/l Toenails 1-5 b/l discolored, thick, dystrophic with subungual debris and pain with palpation to nailbeds due to thickness of nails.  Musculoskeletal: Muscle strength 5/5 to all LE muscle groups  Neurological: Sensation intact with 10 gram monofilament. Vibratory sensation intact.  Assessment: 1. Painful onychomycosis toenails 1-5 b/l 2. Xerosis b/l LE  Plan: 1. 2019 ABN Signed today 2. For dry skin, patient instructed to purchase Dermasil Lotion at [facility] and apply lotion to both feet once daily avoiding application between toes. 3. Toenails 1-5 b/l were debrided in length and girth without iatrogenic bleeding. 4. Patient to continue soft, supportive shoe gear 5. Patient to report any pedal injuries to medical professional immediately. 6. Follow up 3 months. Patient/POA to call should there be a concern in the interim.

## 2018-02-15 NOTE — Telephone Encounter (Signed)
Called bed placement and spoke to [facility]. Observation arranged for tomorrow. Dawn advised unit at [facility] will notify hospitalist when pt arrives.

## 2018-02-15 NOTE — Telephone Encounter (Signed)
Called caregiver [facility]), she stated pt has not started any new medications since last OV with [facility]. She is not taking any blood thinners. She said I could call facility if needed. Called facility phone number and it rings like fax machine.

## 2018-02-15 NOTE — Telephone Encounter (Signed)
Spoke with Dr. Roderic Palau, [facility], who agreed to assist with 23 hour observation for this patient.   He requests asking bed placement to notify them when patient arrives tomorrow.   Please call group home and update med list so we can make sure she hasn't started any new medications like blood thinners since we saw her.

## 2018-02-15 NOTE — Telephone Encounter (Signed)
Melanie at [facility] notified of observation for TCS.

## 2018-02-16 ENCOUNTER — Observation Stay (HOSPITAL_COMMUNITY)
Admission: RE | Admit: 2018-02-16 | Discharge: 2018-02-17 | Disposition: A | Discharge status: Home / Self Care | Payer: Medicare Other | Source: Ambulatory Visit | Attending: Family Medicine | Admitting: Family Medicine

## 2018-02-16 ENCOUNTER — Other Ambulatory Visit: Payer: Self-pay

## 2018-02-16 ENCOUNTER — Encounter (HOSPITAL_COMMUNITY): Payer: Self-pay

## 2018-02-16 DIAGNOSIS — K219 Gastro-esophageal reflux disease without esophagitis: Secondary | ICD-10-CM | POA: Diagnosis not present

## 2018-02-16 DIAGNOSIS — J449 Chronic obstructive pulmonary disease, unspecified: Secondary | ICD-10-CM | POA: Diagnosis present

## 2018-02-16 DIAGNOSIS — D7589 Other specified diseases of blood and blood-forming organs: Secondary | ICD-10-CM | POA: Diagnosis present

## 2018-02-16 DIAGNOSIS — K5900 Constipation, unspecified: Secondary | ICD-10-CM | POA: Diagnosis not present

## 2018-02-16 DIAGNOSIS — I10 Essential (primary) hypertension: Secondary | ICD-10-CM | POA: Diagnosis present

## 2018-02-16 DIAGNOSIS — F419 Anxiety disorder, unspecified: Secondary | ICD-10-CM | POA: Insufficient documentation

## 2018-02-16 DIAGNOSIS — Z7982 Long term (current) use of aspirin: Secondary | ICD-10-CM | POA: Insufficient documentation

## 2018-02-16 DIAGNOSIS — Z79899 Other long term (current) drug therapy: Secondary | ICD-10-CM | POA: Diagnosis not present

## 2018-02-16 DIAGNOSIS — F319 Bipolar disorder, unspecified: Secondary | ICD-10-CM | POA: Diagnosis not present

## 2018-02-16 DIAGNOSIS — D123 Benign neoplasm of transverse colon: Secondary | ICD-10-CM | POA: Diagnosis not present

## 2018-02-16 DIAGNOSIS — Q438 Other specified congenital malformations of intestine: Secondary | ICD-10-CM | POA: Diagnosis not present

## 2018-02-16 DIAGNOSIS — D124 Benign neoplasm of descending colon: Secondary | ICD-10-CM | POA: Diagnosis not present

## 2018-02-16 DIAGNOSIS — D649 Anemia, unspecified: Principal | ICD-10-CM | POA: Insufficient documentation

## 2018-02-16 DIAGNOSIS — Z860101 Personal history of adenomatous and serrated colon polyps: Secondary | ICD-10-CM

## 2018-02-16 DIAGNOSIS — Z86010 Personal history of colonic polyps: Secondary | ICD-10-CM | POA: Insufficient documentation

## 2018-02-16 DIAGNOSIS — B182 Chronic viral hepatitis C: Secondary | ICD-10-CM | POA: Diagnosis not present

## 2018-02-16 DIAGNOSIS — F17210 Nicotine dependence, cigarettes, uncomplicated: Secondary | ICD-10-CM | POA: Insufficient documentation

## 2018-02-16 DIAGNOSIS — K633 Ulcer of intestine: Secondary | ICD-10-CM | POA: Diagnosis not present

## 2018-02-16 LAB — COMPREHENSIVE METABOLIC PANEL
ALT: 44 U/L (ref 0–44)
AST: 56 U/L — ABNORMAL HIGH (ref 15–41)
Albumin: 4.2 g/dL (ref 3.5–5.0)
Alkaline Phosphatase: 122 U/L (ref 38–126)
Anion gap: 8 (ref 5–15)
BUN: 19 mg/dL (ref 8–23)
CO2: 25 mmol/L (ref 22–32)
Calcium: 10.1 mg/dL (ref 8.9–10.3)
Chloride: 105 mmol/L (ref 98–111)
Creatinine, Ser: 0.93 mg/dL (ref 0.44–1.00)
GFR calc Af Amer: >60 mL/min (ref >60)
GFR calc non Af Amer: >60 mL/min (ref >60)
Glucose, Bld: 103 mg/dL — ABNORMAL HIGH (ref 70–99)
Potassium: 4 mmol/L (ref 3.5–5.1)
Sodium: 138 mmol/L (ref 135–145)
Total Bilirubin: 0.8 mg/dL (ref 0.3–1.2)
Total Protein: 7.6 g/dL (ref 6.5–8.1)

## 2018-02-16 LAB — MRSA PCR SCREENING: MRSA BY PCR: NEGATIVE

## 2018-02-16 LAB — CBC
HCT: 39.4 % (ref 36.0–46.0)
Hemoglobin: 12.4 g/dL (ref 12.0–15.0)
MCH: 34.1 pg — ABNORMAL HIGH (ref 26.0–34.0)
MCHC: 31.5 g/dL (ref 30.0–36.0)
MCV: 108.2 fL — AB (ref 80.0–100.0)
Platelets: 260 10*3/uL (ref 150–400)
RBC: 3.64 MIL/uL — ABNORMAL LOW (ref 3.87–5.11)
RDW: 13.3 % (ref 11.5–15.5)
WBC: 8.6 10*3/uL (ref 4.0–10.5)
nRBC: 0 % (ref 0.0–0.2)

## 2018-02-16 LAB — PROTIME-INR
INR: 0.92
Prothrombin Time: 12.3 seconds (ref 11.4–15.2)

## 2018-02-16 MED ORDER — PEG 3350-KCL-NA BICARB-NACL 420 G PO SOLR
2000.0000 mL | Freq: Once | ORAL | Status: AC
  Administered 2018-02-17: 2000 mL via ORAL
  Filled 2018-02-16: qty 4000

## 2018-02-16 MED ORDER — ACETAMINOPHEN 325 MG PO TABS: 650.0000 mg | ORAL_TABLET | Freq: Four times a day (QID) | ORAL | Status: DC | PRN

## 2018-02-16 MED ORDER — DOCUSATE SODIUM 100 MG PO CAPS: 100.0000 mg | ORAL_CAPSULE | Freq: Every day | ORAL | Status: DC

## 2018-02-16 MED ORDER — ACETAMINOPHEN 650 MG RE SUPP: 650.0000 mg | Freq: Four times a day (QID) | RECTAL | Status: DC | PRN

## 2018-02-16 MED ORDER — LINACLOTIDE 145 MCG PO CAPS: 290.0000 ug | ORAL_CAPSULE | Freq: Every day | ORAL | Status: DC

## 2018-02-16 MED ORDER — DIVALPROEX SODIUM 125 MG PO CSDR
500.0000 mg | DELAYED_RELEASE_CAPSULE | Freq: Two times a day (BID) | ORAL | Status: DC
  Administered 2018-02-16 – 2018-02-17 (×2): 500 mg via ORAL
  Filled 2018-02-16 (×10): qty 4

## 2018-02-16 MED ORDER — ALBUTEROL SULFATE (2.5 MG/3ML) 0.083% IN NEBU: 2.5000 mg | INHALATION_SOLUTION | RESPIRATORY_TRACT | Status: DC | PRN

## 2018-02-16 MED ORDER — ONDANSETRON HCL 4 MG/2ML IJ SOLN: 4.0000 mg | Freq: Four times a day (QID) | INTRAMUSCULAR | Status: DC | PRN

## 2018-02-16 MED ORDER — TRAMADOL HCL 50 MG PO TABS
50.0000 mg | ORAL_TABLET | Freq: Two times a day (BID) | ORAL | Status: DC
  Administered 2018-02-16: 50 mg via ORAL
  Filled 2018-02-16 (×2): qty 1

## 2018-02-16 MED ORDER — LORAZEPAM 0.5 MG PO TABS: 0.2500 mg | ORAL_TABLET | Freq: Two times a day (BID) | ORAL | Status: DC | PRN

## 2018-02-16 MED ORDER — BISACODYL 5 MG PO TBEC
10.0000 mg | DELAYED_RELEASE_TABLET | Freq: Once | ORAL | Status: AC
  Administered 2018-02-16: 10 mg via ORAL
  Filled 2018-02-16: qty 2

## 2018-02-16 MED ORDER — PEG 3350-KCL-NA BICARB-NACL 420 G PO SOLR
2000.0000 mL | Freq: Once | ORAL | Status: AC
  Administered 2018-02-16: 2000 mL via ORAL
  Filled 2018-02-16: qty 4000

## 2018-02-16 MED ORDER — SODIUM CHLORIDE 0.9 % IV SOLN
INTRAVENOUS | Status: DC
  Administered 2018-02-16: 16:00:00 via INTRAVENOUS

## 2018-02-16 MED ORDER — TIOTROPIUM BROMIDE MONOHYDRATE 18 MCG IN CAPS
1.0000 | ORAL_CAPSULE | Freq: Every day | RESPIRATORY_TRACT | Status: DC
  Administered 2018-02-17: 18 ug via RESPIRATORY_TRACT
  Filled 2018-02-16: qty 5

## 2018-02-16 MED ORDER — HYDRALAZINE HCL 25 MG PO TABS
25.0000 mg | ORAL_TABLET | Freq: Four times a day (QID) | ORAL | Status: DC
Start: 2018-02-16 — End: 2018-02-17
  Administered 2018-02-16 – 2018-02-17 (×3): 25 mg via ORAL
  Filled 2018-02-16 (×3): qty 1

## 2018-02-16 MED ORDER — HYDROXYZINE HCL 25 MG PO TABS: 25.0000 mg | ORAL_TABLET | Freq: Four times a day (QID) | ORAL | Status: DC | PRN

## 2018-02-16 MED ORDER — OLANZAPINE 5 MG PO TABS
2.5000 mg | ORAL_TABLET | Freq: Every day | ORAL | Status: DC
  Administered 2018-02-16: 2.5 mg via ORAL
  Filled 2018-02-16 (×2): qty 1

## 2018-02-16 MED ORDER — ONDANSETRON HCL 4 MG PO TABS: 4.0000 mg | ORAL_TABLET | Freq: Four times a day (QID) | ORAL | Status: DC | PRN

## 2018-02-16 MED ORDER — OXYBUTYNIN CHLORIDE ER 5 MG PO TB24: 10.0000 mg | ORAL_TABLET | Freq: Every day | ORAL | Status: DC

## 2018-02-16 MED ORDER — SENNA 8.6 MG PO TABS: 1.0000 | ORAL_TABLET | Freq: Every day | ORAL | Status: DC | PRN

## 2018-02-16 MED ORDER — PAROXETINE HCL 20 MG PO TABS
20.0000 mg | ORAL_TABLET | Freq: Every day | ORAL | Status: DC
  Administered 2018-02-16: 20 mg via ORAL
  Filled 2018-02-16: qty 1

## 2018-02-16 MED ORDER — PEG 3350-KCL-NA BICARB-NACL 420 G PO SOLR
2000.0000 mL | Freq: Once | ORAL | Status: AC
  Administered 2018-02-16: 2000 mL via ORAL

## 2018-02-16 MED ORDER — LITHIUM CARBONATE 150 MG PO CAPS
300.0000 mg | ORAL_CAPSULE | Freq: Two times a day (BID) | ORAL | Status: DC
  Administered 2018-02-16 – 2018-02-17 (×2): 300 mg via ORAL
  Filled 2018-02-16 (×2): qty 2

## 2018-02-16 MED ORDER — FAMOTIDINE 20 MG PO TABS
40.0000 mg | ORAL_TABLET | Freq: Every day | ORAL | Status: DC
  Administered 2018-02-16: 40 mg via ORAL
  Filled 2018-02-16: qty 2

## 2018-02-16 MED ORDER — PAROXETINE HCL 20 MG PO TABS
30.0000 mg | ORAL_TABLET | Freq: Every day | ORAL | Status: DC
  Administered 2018-02-17: 30 mg via ORAL
  Filled 2018-02-16: qty 1

## 2018-02-16 NOTE — H&P (View-Only) (Signed)
Spoke to nursing, [place], regarding patient. She has completed bowel prep, having thick loose stool. Not clear. Will give additional 2 liters of golytely at [place]. Bernarda Caffey [facility] [phone] 12/11/20197:40 PM
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
## 2018-02-16 NOTE — Progress Notes (Signed)
Spoke to nursing, [place], regarding patient. She has completed bowel prep, having thick loose stool. Not clear. Will give additional 2 liters of golytely at [place]. Bernarda Caffey [facility] [phone] 12/11/20197:40 PM

## 2018-02-17 ENCOUNTER — Encounter (HOSPITAL_COMMUNITY): Payer: Self-pay | Admitting: *Deleted

## 2018-02-17 ENCOUNTER — Observation Stay (HOSPITAL_COMMUNITY): Payer: Medicare Other | Admitting: Anesthesiology

## 2018-02-17 ENCOUNTER — Encounter (HOSPITAL_COMMUNITY)
Admission: RE | Disposition: A | Discharge status: Home / Self Care | Payer: Self-pay | Source: Ambulatory Visit | Attending: Internal Medicine

## 2018-02-17 DIAGNOSIS — K633 Ulcer of intestine: Secondary | ICD-10-CM | POA: Diagnosis not present

## 2018-02-17 DIAGNOSIS — D649 Anemia, unspecified: Secondary | ICD-10-CM | POA: Diagnosis not present

## 2018-02-17 DIAGNOSIS — K5900 Constipation, unspecified: Secondary | ICD-10-CM

## 2018-02-17 DIAGNOSIS — J449 Chronic obstructive pulmonary disease, unspecified: Secondary | ICD-10-CM

## 2018-02-17 DIAGNOSIS — D124 Benign neoplasm of descending colon: Secondary | ICD-10-CM | POA: Diagnosis not present

## 2018-02-17 DIAGNOSIS — Z86010 Personal history of colonic polyps: Secondary | ICD-10-CM

## 2018-02-17 DIAGNOSIS — Q438 Other specified congenital malformations of intestine: Secondary | ICD-10-CM

## 2018-02-17 DIAGNOSIS — D7589 Other specified diseases of blood and blood-forming organs: Secondary | ICD-10-CM

## 2018-02-17 DIAGNOSIS — K219 Gastro-esophageal reflux disease without esophagitis: Secondary | ICD-10-CM | POA: Diagnosis not present

## 2018-02-17 DIAGNOSIS — B182 Chronic viral hepatitis C: Secondary | ICD-10-CM | POA: Diagnosis not present

## 2018-02-17 DIAGNOSIS — I10 Essential (primary) hypertension: Secondary | ICD-10-CM

## 2018-02-17 DIAGNOSIS — D123 Benign neoplasm of transverse colon: Secondary | ICD-10-CM

## 2018-02-17 DIAGNOSIS — Z7982 Long term (current) use of aspirin: Secondary | ICD-10-CM | POA: Diagnosis not present

## 2018-02-17 DIAGNOSIS — F419 Anxiety disorder, unspecified: Secondary | ICD-10-CM | POA: Diagnosis not present

## 2018-02-17 HISTORY — PX: POLYPECTOMY: SHX5525

## 2018-02-17 HISTORY — PX: BIOPSY: SHX5522

## 2018-02-17 HISTORY — PX: COLONOSCOPY WITH PROPOFOL: SHX5780

## 2018-02-17 SURGERY — COLONOSCOPY WITH PROPOFOL
Anesthesia: General

## 2018-02-17 MED ORDER — ASPIRIN 325 MG PO TBEC: 325.0000 mg | DELAYED_RELEASE_TABLET | Freq: Every day | ORAL | Status: DC

## 2018-02-17 MED ORDER — PEG 3350-KCL-NA BICARB-NACL 420 G PO SOLR
2000.0000 mL | Freq: Once | ORAL | Status: AC
  Administered 2018-02-17: 2000 mL via ORAL

## 2018-02-17 MED ORDER — HYDROMORPHONE HCL 1 MG/ML IJ SOLN: 0.2500 mg | INTRAMUSCULAR | Status: DC | PRN

## 2018-02-17 MED ORDER — HYDROCODONE-ACETAMINOPHEN 7.5-325 MG PO TABS: 1.0000 | ORAL_TABLET | Freq: Once | ORAL | Status: DC | PRN

## 2018-02-17 MED ORDER — DOCUSATE SODIUM 100 MG PO CAPS: 100.0000 mg | ORAL_CAPSULE | Freq: Every day | ORAL | 0 refills | Status: DC

## 2018-02-17 MED ORDER — ASPIRIN 325 MG PO TBEC: 325.0000 mg | DELAYED_RELEASE_TABLET | Freq: Two times a day (BID) | ORAL | Status: DC

## 2018-02-17 MED ORDER — PROMETHAZINE HCL 25 MG/ML IJ SOLN: 6.2500 mg | INTRAMUSCULAR | Status: DC | PRN

## 2018-02-17 MED ORDER — PROPOFOL 10 MG/ML IV BOLUS
INTRAVENOUS | Status: AC
  Filled 2018-02-17: qty 20

## 2018-02-17 MED ORDER — STERILE WATER FOR IRRIGATION IR SOLN
Status: DC | PRN
  Administered 2018-02-17: 1.5 mL

## 2018-02-17 MED ORDER — PROPOFOL 500 MG/50ML IV EMUL
INTRAVENOUS | Status: DC | PRN
  Administered 2018-02-17: 125 ug/kg/min via INTRAVENOUS
  Administered 2018-02-17: 150 ug/kg/min via INTRAVENOUS

## 2018-02-17 MED ORDER — PROPOFOL 10 MG/ML IV BOLUS
INTRAVENOUS | Status: DC | PRN
  Administered 2018-02-17: 20 mg via INTRAVENOUS
  Administered 2018-02-17: 10 mg via INTRAVENOUS
  Administered 2018-02-17: 60 mg via INTRAVENOUS

## 2018-02-17 MED ORDER — RANITIDINE HCL 150 MG PO TABS: 150.0000 mg | ORAL_TABLET | Freq: Every day | ORAL | 0 refills | Status: DC

## 2018-02-17 MED ORDER — LINACLOTIDE 290 MCG PO CAPS: 290.0000 ug | ORAL_CAPSULE | Freq: Every day | ORAL | 0 refills | Status: DC

## 2018-02-17 MED ORDER — LACTATED RINGERS IV SOLN: INTRAVENOUS | Status: DC

## 2018-02-17 NOTE — Anesthesia Procedure Notes (Signed)
Procedure Name: MAC Date/Time: 02/17/2018 3:12 PM Performed by: Vista Deck, CRNA Pre-anesthesia Checklist: Patient identified, Emergency Drugs available, Suction available, Timeout performed and Patient being monitored Patient Re-evaluated:Patient Re-evaluated prior to induction Oxygen Delivery Method: Nasal Cannula

## 2018-02-17 NOTE — Discharge Summary (Signed)
Physician Discharge Summary  Karen Keller SPQ:[id number] DOB: 26-Aug-1955 DOA: 02/16/2018  PCP: Rosita Fire, MD GI: [place]  Admit date: 02/16/2018 Discharge date: 02/17/2018  Admitted From: Group Home Disposition: Group Home   Recommendations for Outpatient Follow-up:  1. Follow up with PCP in 1 weeks 2. Follow up with GI in 1-2 months 3. Please follow up on the following pending results: colon biopsy results (GI will call with results)  Discharge Condition: STABLE   CODE STATUS: FULL    Brief Hospitalization Summary: Please see all hospital notes, images, labs for full details of the hospitalization. HPI: Karen Keller is a 62 y.o. female with medical history significant of bipolar disorder, hypertension, hep C, COPD is directly admitted to the hospital for further evaluation of anemia with colonoscopy.  Patient had multiple attempts at colonoscopy to be performed in the past, during the studies, she always had poor bowel prep.  She complains of chronic indigestion for the past year.  She reports cramping in her epigastrium ever since starting on Linzess.  Denies any shortness of breath, cough, melena or hematochezia, vomiting.  She does have nausea.    Pt was admitted for colon prep and tolerated procedure well.  She had her colonoscopy with no complications and details of report are reported below.  She was cleared by GI for discharge home.  Pt will be discharging back to group home in stable condition. GI recommendations are noted below.  Pt discharged with ranitidine as pepcid not on her drug formulary.  Other meds did not change.    Procedures: Colonoscopy 02/17/18 Impression:       - Redundant colon.                           - One 6 mm polyp at the hepatic flexure, removed                            with a cold snare. Resected and retrieved. Isolated                            colonic ulceration ( Likely ischemia secondary to                            prep).  Status post biopsy. .                           - The examination was otherwise normal on direct                            and retroflexion views. Moderate Sedation:      Moderate (conscious) sedation was administered by the endoscopy nurse       and supervised by the endoscopist. The following parameters were       monitored: oxygen saturation, heart rate, blood pressure, respiratory       rate, EKG, adequacy of pulmonary ventilation, and response to care. Recommendation:           - Patient has a contact number available for                            emergencies. The signs and symptoms of potential  delayed complications were discussed with the                            patient. Return to normal activities tomorrow.                            Written discharge instructions were provided to the                            patient.                           - Advance diet as tolerated.                           - Return to my office in 3 months. Patient should be discharged on Colace and Linzess 290 daily. Also                            - Pepcid 40 mg twice daily as needed for reflux. We will follow-up on pathology.                           Patient could be discharged later today from a GI standpoint. I appreciate the hospitalist assistance                            with this nice lady.   Discharge Diagnoses:  Active Problems:   Constipation   Chronic hepatitis C without hepatic coma (HCC)   H/O adenomatous polyp of colon   Macrocytosis   COPD (chronic obstructive pulmonary disease) (HCC)   Bipolar disorder (HCC)   HTN (hypertension)    Discharge Instructions: Discharge Instructions    Diet - low sodium heart healthy   Complete by:  As directed    Increase activity slowly   Complete by:  As directed      Allergies as of 02/17/2018      Reactions   Neurontin [gabapentin] Other (See Comments)   Unknown reaction; itching   Septra Ds  [sulfamethoxazole-trimethoprim] Other (See Comments)   Unknown reaction; itching      Medication List    TAKE these medications   acetaminophen 325 MG tablet Commonly known as:  TYLENOL Take 975 mg by mouth every 8 (eight) hours. (0600, 1400, 2200)   aspirin 325 MG EC tablet Take 1 tablet (325 mg total) by mouth 2 (two) times daily. (0800 & 1700)   B-COMPLEX PO Take 1 tablet by mouth daily. (0800)   calcium-vitamin D 500-200 MG-UNIT tablet Commonly known as:  OSCAL WITH D Take 1 tablet by mouth 2 (two) times daily with a meal. (1200 & 1700)   DEPAKOTE SPRINKLES 125 MG capsule Generic drug:  divalproex Take 500 mg by mouth 2 (two) times daily. (0800 & 2000)   docusate sodium 100 MG capsule Commonly known as:  COLACE Take 1 capsule (100 mg total) by mouth daily. (0800)   hydrALAZINE 25 MG tablet Commonly known as:  APRESOLINE Take 25 mg by mouth 4 (four) times daily. (0800, 1200, 1600, 2000)   hydrOXYzine 25 MG tablet Commonly known as:  ATARAX/VISTARIL Take 25 mg by mouth every 6 (six)  hours as needed for anxiety.   linaclotide 290 MCG Caps capsule Commonly known as:  LINZESS Take 1 capsule (290 mcg total) by mouth daily before breakfast. (0700)   lithium 300 MG tablet Take 300 mg by mouth 2 (two) times daily. (0800 & 2000)   LORazepam 0.5 MG tablet Commonly known as:  ATIVAN Take 0.25 mg by mouth every 12 (twelve) hours as needed for anxiety.   multivitamin with minerals Tabs tablet Take 1 tablet by mouth daily. (0800)   naproxen 500 MG tablet Commonly known as:  NAPROSYN Take 500 mg by mouth 2 (two) times daily as needed (chest pain).   OLANZapine 2.5 MG tablet Commonly known as:  ZYPREXA Take 2.5 mg by mouth at bedtime. (2000)   oxybutynin 10 MG 24 hr tablet Commonly known as:  DITROPAN-XL Take 10 mg by mouth daily. (0800)   PARoxetine 30 MG tablet Commonly known as:  PAXIL Take 30 mg by mouth daily. (0800)   PARoxetine 20 MG tablet Commonly known  as:  PAXIL Take 20 mg by mouth at bedtime.   PROAIR HFA 108 (90 Base) MCG/ACT inhaler Generic drug:  albuterol Inhale 2 puffs into the lungs 4 (four) times daily as needed for wheezing or shortness of breath. (0800, 1200, 1600, 2000)   ranitidine 150 MG tablet Commonly known as:  ZANTAC Take 1 tablet (150 mg total) by mouth at bedtime.   senna 8.6 MG Tabs tablet Commonly known as:  SENOKOT Take 1 tablet by mouth daily as needed (for constipation).   SPIRIVA HANDIHALER 18 MCG inhalation capsule Generic drug:  tiotropium Place 1 capsule into inhaler and inhale daily.   traMADol 50 MG tablet Commonly known as:  ULTRAM Take 1 tablet (50 mg total) by mouth every 6 (six) hours as needed. What changed:    when to take this  additional instructions      Follow-up Information    Rosita Fire, MD. Schedule an appointment as soon as possible for a visit in 1 week(s).   Specialty:  Internal Medicine Why:  Hospital Follow Up  Contact information: [place] [address] [phone]        Daneil Dolin, MD. Schedule an appointment as soon as possible for a visit in 2 month(s).   Specialty:  Gastroenterology Why:  Hospital Follow Up  Contact information: [place] [address] [phone]          Allergies  Allergen Reactions  . Neurontin [Gabapentin] Other (See Comments)    Unknown reaction; itching  . Septra Ds [Sulfamethoxazole-Trimethoprim] Other (See Comments)    Unknown reaction; itching   Allergies as of 02/17/2018      Reactions   Neurontin [gabapentin] Other (See Comments)   Unknown reaction; itching   Septra Ds [sulfamethoxazole-trimethoprim] Other (See Comments)   Unknown reaction; itching      Medication List    TAKE these medications   acetaminophen 325 MG tablet Commonly known as:  TYLENOL Take 975 mg by mouth every 8 (eight) hours. (0600, 1400, 2200)   aspirin 325 MG EC tablet Take 1 tablet (325  mg total) by mouth 2 (two) times daily. (0800 & 1700)   B-COMPLEX PO Take 1 tablet by mouth daily. (0800)   calcium-vitamin D 500-200 MG-UNIT tablet Commonly known as:  OSCAL WITH D Take 1 tablet by mouth 2 (two) times daily with a meal. (1200 & 1700)   DEPAKOTE SPRINKLES 125 MG capsule Generic drug:  divalproex Take  500 mg by mouth 2 (two) times daily. (0800 & 2000)   docusate sodium 100 MG capsule Commonly known as:  COLACE Take 1 capsule (100 mg total) by mouth daily. (0800)   hydrALAZINE 25 MG tablet Commonly known as:  APRESOLINE Take 25 mg by mouth 4 (four) times daily. (0800, 1200, 1600, 2000)   hydrOXYzine 25 MG tablet Commonly known as:  ATARAX/VISTARIL Take 25 mg by mouth every 6 (six) hours as needed for anxiety.   linaclotide 290 MCG Caps capsule Commonly known as:  LINZESS Take 1 capsule (290 mcg total) by mouth daily before breakfast. (0700)   lithium 300 MG tablet Take 300 mg by mouth 2 (two) times daily. (0800 & 2000)   LORazepam 0.5 MG tablet Commonly known as:  ATIVAN Take 0.25 mg by mouth every 12 (twelve) hours as needed for anxiety.   multivitamin with minerals Tabs tablet Take 1 tablet by mouth daily. (0800)   naproxen 500 MG tablet Commonly known as:  NAPROSYN Take 500 mg by mouth 2 (two) times daily as needed (chest pain).   OLANZapine 2.5 MG tablet Commonly known as:  ZYPREXA Take 2.5 mg by mouth at bedtime. (2000)   oxybutynin 10 MG 24 hr tablet Commonly known as:  DITROPAN-XL Take 10 mg by mouth daily. (0800)   PARoxetine 30 MG tablet Commonly known as:  PAXIL Take 30 mg by mouth daily. (0800)   PARoxetine 20 MG tablet Commonly known as:  PAXIL Take 20 mg by mouth at bedtime.   PROAIR HFA 108 (90 Base) MCG/ACT inhaler Generic drug:  albuterol Inhale 2 puffs into the lungs 4 (four) times daily as needed for wheezing or shortness of breath. (0800, 1200, 1600, 2000)   ranitidine 150 MG tablet Commonly known as:  ZANTAC Take 1  tablet (150 mg total) by mouth at bedtime.   senna 8.6 MG Tabs tablet Commonly known as:  SENOKOT Take 1 tablet by mouth daily as needed (for constipation).   SPIRIVA HANDIHALER 18 MCG inhalation capsule Generic drug:  tiotropium Place 1 capsule into inhaler and inhale daily.   traMADol 50 MG tablet Commonly known as:  ULTRAM Take 1 tablet (50 mg total) by mouth every 6 (six) hours as needed. What changed:    when to take this  additional instructions       Procedures/Studies:  No results found.   Subjective:   Pt tolerated procedure well.  Pt is asking to go home.  Pt tolerating diet well.    Discharge Exam: Vitals:   02/17/18 1357 02/17/18 1600  BP: (!) 160/75 (!) 141/68  Pulse: (!) 57   Resp: (!) 22 16  Temp: 98.5 F (36.9 C) (!) 97.5 F (36.4 C)  SpO2: 97% 99%   Vitals:   02/17/18 0609 02/17/18 1020 02/17/18 1357 02/17/18 1600  BP: (!) 157/85  (!) 160/75 (!) 141/68  Pulse: (!) 56  (!) 57   Resp: 18  (!) 22 16  Temp: 97.6 F (36.4 C)  98.5 F (36.9 C) (!) 97.5 F (36.4 C)  TempSrc: Oral  Oral   SpO2: 99% 99% 97% 99%  Weight:   79 kg   Height:   5\' 8"  (1.727 m)   General: Pt is alert, awake, not in acute distress Cardiovascular: RRR, S1/S2 +, no rubs, no gallops Respiratory: CTA bilaterally, no wheezing, no rhonchi Abdominal: Soft, NT, ND, bowel sounds + Extremities: no edema, no cyanosis   The results of significant diagnostics from this hospitalization (including  imaging, microbiology, ancillary and laboratory) are listed below for reference.     Microbiology: Recent Results (from the past 240 hour(s))  MRSA PCR Screening     Status: None   Collection Time: 02/16/18  8:40 PM  Result Value Ref Range Status   MRSA by PCR NEGATIVE NEGATIVE Final    Comment:        The GeneXpert MRSA Assay (FDA approved for NASAL specimens only), is one component of a comprehensive MRSA colonization surveillance program. It is not intended to diagnose  MRSA infection nor to guide or monitor treatment for MRSA infections. Performed at [facility], [address] [place], [place] [address]      Labs: BNP (last 3 results) No results for input(s): BNP in the last 8760 hours. Basic Metabolic Panel: Recent Labs  Lab 02/16/18 1053  NA 138  K 4.0  CL 105  CO2 25  GLUCOSE 103*  BUN 19  CREATININE 0.93  CALCIUM 10.1   Liver Function Tests: Recent Labs  Lab 02/16/18 1053  AST 56*  ALT 44  ALKPHOS 122  BILITOT 0.8  PROT 7.6  ALBUMIN 4.2   No results for input(s): LIPASE, AMYLASE in the last 168 hours. No results for input(s): AMMONIA in the last 168 hours. CBC: Recent Labs  Lab 02/16/18 1053  WBC 8.6  HGB 12.4  HCT 39.4  MCV 108.2*  PLT 260   Cardiac Enzymes: No results for input(s): CKTOTAL, CKMB, CKMBINDEX, TROPONINI in the last 168 hours. BNP: Invalid input(s): POCBNP CBG: No results for input(s): GLUCAP in the last 168 hours. D-Dimer No results for input(s): DDIMER in the last 72 hours. Hgb A1c No results for input(s): HGBA1C in the last 72 hours. Lipid Profile No results for input(s): CHOL, HDL, LDLCALC, TRIG, CHOLHDL, LDLDIRECT in the last 72 hours. Thyroid function studies No results for input(s): TSH, T4TOTAL, T3FREE, THYROIDAB in the last 72 hours.  Invalid input(s): FREET3 Anemia work up No results for input(s): VITAMINB12, FOLATE, FERRITIN, TIBC, IRON, RETICCTPCT in the last 72 hours. Urinalysis    Component Value Date/Time   COLORURINE YELLOW 08/05/2016 1448   APPEARANCEUR CLEAR 08/05/2016 1448   LABSPEC 1.012 08/05/2016 1448   PHURINE 6.0 08/05/2016 1448   GLUCOSEU NEGATIVE 08/05/2016 1448   HGBUR NEGATIVE 08/05/2016 1448   BILIRUBINUR NEGATIVE 08/05/2016 1448   KETONESUR NEGATIVE 08/05/2016 1448   PROTEINUR NEGATIVE 08/05/2016 1448   NITRITE NEGATIVE 08/05/2016 1448   LEUKOCYTESUR SMALL (A) 08/05/2016 1448   Sepsis Labs Invalid input(s): PROCALCITONIN,  WBC,   LACTICIDVEN Microbiology Recent Results (from the past 240 hour(s))  MRSA PCR Screening     Status: None   Collection Time: 02/16/18  8:40 PM  Result Value Ref Range Status   MRSA by PCR NEGATIVE NEGATIVE Final    Comment:        The GeneXpert MRSA Assay (FDA approved for NASAL specimens only), is one component of a comprehensive MRSA colonization surveillance program. It is not intended to diagnose MRSA infection nor to guide or monitor treatment for MRSA infections. Performed at [facility], [address] [place], [place] [address]    Time coordinating discharge:   SIGNED:  Irwin Brakeman, MD  [facility] 02/17/2018, 5:09 PM Pager [phone]  If 7PM-7AM, please contact night-coverage [email or web address] Password [email or web address]

## 2018-02-17 NOTE — Transfer of Care (Signed)
Immediate Anesthesia Transfer of Care Note  Patient: Karen Keller  Procedure(s) Performed: COLONOSCOPY WITH PROPOFOL (N/A ) POLYPECTOMY BIOPSY  Patient Location: Nursing Unit  Anesthesia Type:General  Level of Consciousness: awake, alert  and patient cooperative  Airway & Oxygen Therapy: Patient Spontanous Breathing  Post-op Assessment: Report given to RN and Post -op Vital signs reviewed and stable  Post vital signs: Reviewed and stable  Last Vitals:  Vitals Value Taken Time  BP    Temp    Pulse    Resp    SpO2      Last Pain:  Vitals:   02/17/18 1515  TempSrc:   PainSc: 0-No pain      Patients Stated Pain Goal: 7 (88/41/66 0630)  Complications: No apparent anesthesia complications  Patient to PACU., awake alert. One set of vital signs and then transported to 300 by G. Witherspoon Therapist, sports.

## 2018-02-17 NOTE — Progress Notes (Signed)
Has been back from colonoscopy for several hours and is alert and oriented and eating and drinking.  Started back on cardiac diet and to be discharged back to [facility].  [name] called and she will sent someone to transport.  IV removed and discharge instructions reviewed.

## 2018-02-17 NOTE — Progress Notes (Signed)
Briefly saw patient. She reports brown liquid stool. Nursing staff report brown watery stool with fleck of solid after enema. Discussed with Dr. Gala Romney. Plan for additional 2000 ML of golytely prior to colonoscopy.   Laureen Ochs. Bernarda Caffey [facility] [phone] 12/12/20198:18 AM

## 2018-02-17 NOTE — Anesthesia Preprocedure Evaluation (Signed)
Anesthesia Evaluation  Patient identified by MRN, date of birth, ID band Patient awake    Reviewed: Allergy & Precautions, NPO status , Patient's Chart, lab work & pertinent test results  Airway Mallampati: II  TM Distance: >3 FB Neck ROM: Full    Dental no notable dental hx. (+) Chipped, Missing   Pulmonary asthma , COPD,  COPD inhaler, Current Smoker,    Pulmonary exam normal breath sounds clear to auscultation       Cardiovascular Exercise Tolerance: Good hypertension, negative cardio ROS Normal cardiovascular examI Rhythm:Regular Rate:Normal     Neuro/Psych Seizures -, Well Controlled,  Anxiety Depression Bipolar Disorder H/o SZ remote -none in the past 10 years  negative psych ROS   GI/Hepatic negative GI ROS, (+) Hepatitis -, CH/o Hep C + - not Tx'd yet   Endo/Other  negative endocrine ROS  Renal/GU negative Renal ROS  negative genitourinary   Musculoskeletal negative musculoskeletal ROS (+)   Abdominal   Peds negative pediatric ROS (+)  Hematology negative hematology ROS (+) anemia ,   Anesthesia Other Findings   Reproductive/Obstetrics negative OB ROS                             Anesthesia Physical Anesthesia Plan  ASA: II  Anesthesia Plan: General   Post-op Pain Management:    Induction: Intravenous  PONV Risk Score and Plan:   Airway Management Planned: Nasal Cannula and Simple Face Mask  Additional Equipment:   Intra-op Plan:   Post-operative Plan:   Informed Consent: I have reviewed the patients History and Physical, chart, labs and discussed the procedure including the risks, benefits and alternatives for the proposed anesthesia with the patient or authorized representative who has indicated his/her understanding and acceptance.   Dental advisory given  Plan Discussed with: CRNA  Anesthesia Plan Comments:         Anesthesia Quick Evaluation

## 2018-02-17 NOTE — Progress Notes (Signed)
[name] called and said driver would be at main entrance.  Directed patient to main entrance to wait for ride.

## 2018-02-17 NOTE — Anesthesia Postprocedure Evaluation (Signed)
Anesthesia Post Note  Patient: Karen Keller  Procedure(s) Performed: COLONOSCOPY WITH PROPOFOL (N/A ) POLYPECTOMY BIOPSY  Patient location during evaluation: Nursing Unit Anesthesia Type: General Level of consciousness: awake and alert and patient cooperative Pain management: satisfactory to patient Vital Signs Assessment: post-procedure vital signs reviewed and stable Respiratory status: spontaneous breathing Cardiovascular status: stable Postop Assessment: no apparent nausea or vomiting Anesthetic complications: no     Last Vitals:  Vitals:   02/17/18 1357 02/17/18 1600  BP: (!) 160/75 (!) 141/68  Pulse: (!) 57   Resp: (!) 22 16  Temp: 36.9 C (!) 36.4 C  SpO2: 97% 99%    Last Pain:  Vitals:   02/17/18 1600  TempSrc:   PainSc: 0-No pain                 Iriel Nason

## 2018-02-17 NOTE — Interval H&P Note (Signed)
History and Physical Interval Note:  04/19/1550 0:80 PM  Karen Keller  has presented today for surgery, with the diagnosis of History of colonic adenomas with incomplete colonoscopy due to poor prep in 07/2017  The various methods of treatment have been discussed with the patient and family. After consideration of risks, benefits and other options for treatment, the patient has consented to  Procedure(s): COLONOSCOPY WITH PROPOFOL (N/A) as a surgical intervention .  The patient's history has been reviewed, patient examined, no change in status, stable for surgery.  I have reviewed the patient's chart and labs.  Questions were answered to the patient's satisfaction.     Patient seen and examined in short stay.  Plans for surveillance colonoscopy.  Additional prep likely effective.  Further recommendations to follow.  Karen Keller

## 2018-02-17 NOTE — Care Management Obs Status (Signed)
[place] NOTIFICATION   Patient Details  Name: Keya Wynes MRN: [id number] Date of Birth: 25-Feb-1956   Medicare Observation Status Notification Given:  Yes    Sherald Barge, RN 02/17/2018, 10:43 AM

## 2018-02-17 NOTE — Clinical Social Work Note (Signed)
Spoke with Ms Huel Cote who confirmed that patient has NO guardian.

## 2018-02-17 NOTE — Op Note (Signed)
[facility] Patient Name: Karen Keller Procedure Date: 02/17/2018 3:00 PM MRN: [id number] Date of Birth: June 03, 1955 Attending MD: Norvel Richards , MD CSN: [id number] Age: 62 Admit Type: Outpatient Procedure:                Colonoscopy Indications:              High risk colon cancer surveillance: Personal                            history of colonic polyps Providers:                Norvel Richards, MD, Lurline Del, RN, Gerome Sam, RN, Randa Spike, Technician Referring MD:              Medicines:                Propofol per Anesthesia Complications:            No immediate complications. Estimated Blood Loss:     Estimated blood loss was minimal. Procedure:                Pre-Anesthesia Assessment:                           - Prior to the procedure, a History and Physical                            was performed, and patient medications and                            allergies were reviewed. The patient's tolerance of                            previous anesthesia was also reviewed. The risks                            and benefits of the procedure and the sedation                            options and risks were discussed with the patient.                            All questions were answered, and informed consent                            was obtained. Prior Anticoagulants: The patient has                            taken no previous anticoagulant or antiplatelet                            agents. ASA Grade Assessment: III - A patient with  severe systemic disease. After reviewing the risks                            and benefits, the patient was deemed in                            satisfactory condition to undergo the procedure.                           After obtaining informed consent, the colonoscope                            was passed under direct vision. Throughout the          procedure, the patient's blood pressure, pulse, and                            oxygen saturations were monitored continuously. The                            CF-[id number] ([id number]) scope was introduced through                            the and advanced to the the cecum, identified by                            appendiceal orifice and ileocecal valve. The                            colonoscopy was performed without difficulty. The                            patient tolerated the procedure well. The quality                            of the bowel preparation was adequate. The                            colonoscopy was technically difficult and complex                            due to a redundant colon. The patient tolerated the                            procedure well. The quality of the bowel                            preparation was adequate. Scope In: 3:19:50 PM Scope Out: 3:47:41 PM Scope Withdrawal Time: 0 hours 13 minutes 28 seconds  Total Procedure Duration: 0 hours 27 minutes 51 seconds  Findings:      The perianal and digital rectal examinations were normal.      The colon (entire examined portion) was moderately redundant. Utilized       external abdominal pressure to reach the cecum.      A  6 mm polyp was found in the hepatic flexure. The polyp was       pedunculated. The polyp was removed with a cold snare. Resection and       retrieval were complete. Estimated blood loss was minimal. (2) two 6 mm       ulcers adjacent to 1 another in the descending segment of uncertain       significance?"question ischemia/biopsied      The exam was otherwise without abnormality on direct and retroflexion       views. Impression:               - Redundant colon.                           - One 6 mm polyp at the hepatic flexure, removed                            with a cold snare. Resected and retrieved. Isolated                            colonic ulceration ( Likely ischemia  secondary to                            prep). Status post biopsy. .                           - The examination was otherwise normal on direct                            and retroflexion views. Moderate Sedation:      Moderate (conscious) sedation was administered by the endoscopy nurse       and supervised by the endoscopist. The following parameters were       monitored: oxygen saturation, heart rate, blood pressure, respiratory       rate, EKG, adequacy of pulmonary ventilation, and response to care. Recommendation:           - Patient has a contact number available for                            emergencies. The signs and symptoms of potential                            delayed complications were discussed with the                            patient. Return to normal activities tomorrow.                            Written discharge instructions were provided to the                            patient.                           - Advance diet as tolerated.                           -  Return to my office in 3 months. Patient should                            be discharged on Colace and Linzess 290 daily. Also                            - Pepcid 40 mg twice daily as needed for reflux. We                            will follow-up on pathology.                           Patient could be discharged later today from a GI                            standpoint. I appreciate the hospitalist assistance                            with this nice lady. Procedure Code(s):        --- Professional ---                           [phone], Colonoscopy, flexible; with removal of                            tumor(s), polyp(s), or other lesion(s) by snare                            technique Diagnosis Code(s):        --- Professional ---                           Z86.010, Personal history of colonic polyps                           D12.3, Benign neoplasm of transverse colon (hepatic                             flexure or splenic flexure)                           Q43.8, Other specified congenital malformations of                            intestine CPT copyright 2018 American Medical Association. All rights reserved. The codes documented in this report are preliminary and upon coder review may  be revised to meet current compliance requirements. [name]. Rourk, MD Norvel Richards, MD 02/17/2018 4:04:39 PM This report has been signed electronically. Number of Addenda: 0

## 2018-02-17 NOTE — Interval H&P Note (Signed)
History and Physical Interval Note:  24/49/7530 0:51 PM  Karen Keller  has presented today for surgery, with the diagnosis of History of colonic adenomas with incomplete colonoscopy due to poor prep in 07/2017  The various methods of treatment have been discussed with the patient and family. After consideration of risks, benefits and other options for treatment, the patient has consented to  Procedure(s): COLONOSCOPY WITH PROPOFOL (N/A) as a surgical intervention .  The patient's history has been reviewed, patient examined, no change in status, stable for surgery.  I have reviewed the patient's chart and labs.  Questions were answered to the patient's satisfaction.     [name]

## 2018-02-27 ENCOUNTER — Encounter: Payer: Self-pay | Admitting: Internal Medicine

## 2018-02-28 ENCOUNTER — Telehealth: Payer: Self-pay | Admitting: Gastroenterology

## 2018-02-28 NOTE — Telephone Encounter (Signed)
In process of completing [employer] paperwork for HCV treatment now that she has completed hospitalization for colonoscopy.   Please update medication AGAIN. According to discharge summary, she went home on Raniditine. We need to confirm what time she is receiving this if she is actually taking.   Have them fax copy of her MAR for review.

## 2018-03-01 DIAGNOSIS — D126 Benign neoplasm of colon, unspecified: Secondary | ICD-10-CM | POA: Diagnosis not present

## 2018-03-01 DIAGNOSIS — K5900 Constipation, unspecified: Secondary | ICD-10-CM | POA: Diagnosis not present

## 2018-03-01 DIAGNOSIS — J449 Chronic obstructive pulmonary disease, unspecified: Secondary | ICD-10-CM | POA: Diagnosis not present

## 2018-03-01 DIAGNOSIS — B182 Chronic viral hepatitis C: Secondary | ICD-10-CM | POA: Diagnosis not present

## 2018-03-04 NOTE — Telephone Encounter (Signed)
Spoke with [employer]. She is going to check into the medication update and fax [place] a new copy.

## 2018-03-08 NOTE — Telephone Encounter (Signed)
Updated medication list was faxed by pts caregiver. List placed in LSL bend to review medication.

## 2018-03-10 ENCOUNTER — Encounter (HOSPITAL_COMMUNITY): Payer: Self-pay | Admitting: Internal Medicine

## 2018-03-13 NOTE — Telephone Encounter (Signed)
Reviewed medication list. She is now on omeprazole 40mg  once daily and raniditine 150mg  at bedtime.   EPCLUSA PAPERWORK COMPLETED AND PLACED ON ALICIA'S DESK.  PLEASE GIVE ORDER TO NURSING HOME TO STOP OMEPRAZOLE AND RANITIDINE. WE DO NOT WANT HER ON THESE WHEN HER EPCLUSA MEDICATION IS STARTED.  SHE CAN TAKE FAMOTIDINE 40MG  BID INSTEAD (once Raeanne Gathers is started we will have to give specific instructions on timing of the famotidine).

## 2018-03-15 NOTE — Telephone Encounter (Signed)
Forms submitted to [employer] this morning.

## 2018-03-31 NOTE — Telephone Encounter (Signed)
I'm not aware that patient is being discharged from [facility]. Keep me informed. Thanks.

## 2018-03-31 NOTE — Telephone Encounter (Signed)
I received a call from [facility] and he has confirmed that the patient's Karen Keller will be shipped, however he needs the admission and expected discharge date from [facility].  I spoke with Elberta Fortis, administrator for [facility] [phone]) and he stated he will call ne with those dates.  Brenton Grills will call back on Monday, January 27th.

## 2018-04-01 NOTE — Telephone Encounter (Signed)
Routing to [place] because [employer] will call Monday needing an admission and discharge date.

## 2018-04-01 NOTE — Telephone Encounter (Signed)
The patient's admission date was 11-13-2014 and there is no discharge date.

## 2018-04-04 ENCOUNTER — Ambulatory Visit: Payer: Medicare Other | Admitting: Podiatry

## 2018-04-04 NOTE — Telephone Encounter (Signed)
Karen Keller made aware of the admission date.   The medication will be shipped to our office and it was reiterated to make sure the medication is shipped to our office.

## 2018-04-06 ENCOUNTER — Other Ambulatory Visit: Payer: Self-pay

## 2018-04-06 ENCOUNTER — Encounter (HOSPITAL_COMMUNITY): Payer: Self-pay | Admitting: Emergency Medicine

## 2018-04-06 ENCOUNTER — Encounter (HOSPITAL_COMMUNITY): Payer: Self-pay | Admitting: *Deleted

## 2018-04-06 ENCOUNTER — Emergency Department (HOSPITAL_COMMUNITY)
Admission: EM | Admit: 2018-04-06 | Discharge: 2018-04-06 | Disposition: A | Discharge status: Other Acute Inpatient Hospital | Payer: Medicare Other | Attending: Emergency Medicine | Admitting: Emergency Medicine

## 2018-04-06 ENCOUNTER — Inpatient Hospital Stay (HOSPITAL_COMMUNITY)
Admission: AD | Admit: 2018-04-06 | Discharge: 2018-04-11 | DRG: 885 | Disposition: A | Discharge status: Home / Self Care | Payer: Medicare Other | Source: Intra-hospital | Attending: Psychiatry | Admitting: Psychiatry

## 2018-04-06 DIAGNOSIS — F419 Anxiety disorder, unspecified: Secondary | ICD-10-CM | POA: Diagnosis not present

## 2018-04-06 DIAGNOSIS — F17210 Nicotine dependence, cigarettes, uncomplicated: Secondary | ICD-10-CM | POA: Diagnosis not present

## 2018-04-06 DIAGNOSIS — F339 Major depressive disorder, recurrent, unspecified: Secondary | ICD-10-CM | POA: Diagnosis not present

## 2018-04-06 DIAGNOSIS — F322 Major depressive disorder, single episode, severe without psychotic features: Secondary | ICD-10-CM | POA: Diagnosis not present

## 2018-04-06 DIAGNOSIS — R45851 Suicidal ideations: Secondary | ICD-10-CM | POA: Insufficient documentation

## 2018-04-06 DIAGNOSIS — G4700 Insomnia, unspecified: Secondary | ICD-10-CM | POA: Diagnosis not present

## 2018-04-06 DIAGNOSIS — F251 Schizoaffective disorder, depressive type: Principal | ICD-10-CM | POA: Diagnosis present

## 2018-04-06 DIAGNOSIS — Z79899 Other long term (current) drug therapy: Secondary | ICD-10-CM | POA: Insufficient documentation

## 2018-04-06 DIAGNOSIS — K5900 Constipation, unspecified: Secondary | ICD-10-CM | POA: Diagnosis present

## 2018-04-06 DIAGNOSIS — F259 Schizoaffective disorder, unspecified: Secondary | ICD-10-CM

## 2018-04-06 DIAGNOSIS — J449 Chronic obstructive pulmonary disease, unspecified: Secondary | ICD-10-CM | POA: Diagnosis present

## 2018-04-06 DIAGNOSIS — I10 Essential (primary) hypertension: Secondary | ICD-10-CM | POA: Diagnosis not present

## 2018-04-06 DIAGNOSIS — F319 Bipolar disorder, unspecified: Secondary | ICD-10-CM | POA: Insufficient documentation

## 2018-04-06 DIAGNOSIS — T1491XA Suicide attempt, initial encounter: Secondary | ICD-10-CM | POA: Diagnosis not present

## 2018-04-06 DIAGNOSIS — J45909 Unspecified asthma, uncomplicated: Secondary | ICD-10-CM | POA: Insufficient documentation

## 2018-04-06 DIAGNOSIS — Z8249 Family history of ischemic heart disease and other diseases of the circulatory system: Secondary | ICD-10-CM | POA: Diagnosis not present

## 2018-04-06 DIAGNOSIS — Z7982 Long term (current) use of aspirin: Secondary | ICD-10-CM | POA: Insufficient documentation

## 2018-04-06 DIAGNOSIS — Z593 Problems related to living in residential institution: Secondary | ICD-10-CM | POA: Diagnosis not present

## 2018-04-06 LAB — COMPREHENSIVE METABOLIC PANEL
ALT: 44 U/L (ref 0–44)
AST: 58 U/L — AB (ref 15–41)
Albumin: 3.6 g/dL (ref 3.5–5.0)
Alkaline Phosphatase: 99 U/L (ref 38–126)
Anion gap: 6 (ref 5–15)
BUN: 24 mg/dL — AB (ref 8–23)
CO2: 28 mmol/L (ref 22–32)
Calcium: 9.5 mg/dL (ref 8.9–10.3)
Chloride: 106 mmol/L (ref 98–111)
Creatinine, Ser: 0.96 mg/dL (ref 0.44–1.00)
GFR calc Af Amer: >60 mL/min (ref >60)
GFR calc non Af Amer: >60 mL/min (ref >60)
Glucose, Bld: 98 mg/dL (ref 70–99)
Potassium: 4.4 mmol/L (ref 3.5–5.1)
Sodium: 140 mmol/L (ref 135–145)
Total Bilirubin: 0.5 mg/dL (ref 0.3–1.2)
Total Protein: 6.3 g/dL — ABNORMAL LOW (ref 6.5–8.1)

## 2018-04-06 LAB — VALPROIC ACID LEVEL: Valproic Acid Lvl: 31 ug/mL — ABNORMAL LOW (ref 50.0–100.0)

## 2018-04-06 LAB — CBC
HEMATOCRIT: 38 % (ref 36.0–46.0)
Hemoglobin: 12 g/dL (ref 12.0–15.0)
MCH: 34.5 pg — ABNORMAL HIGH (ref 26.0–34.0)
MCHC: 31.6 g/dL (ref 30.0–36.0)
MCV: 109.2 fL — ABNORMAL HIGH (ref 80.0–100.0)
Platelets: 216 10*3/uL (ref 150–400)
RBC: 3.48 MIL/uL — ABNORMAL LOW (ref 3.87–5.11)
RDW: 13.2 % (ref 11.5–15.5)
WBC: 5.1 10*3/uL (ref 4.0–10.5)
nRBC: 0 % (ref 0.0–0.2)

## 2018-04-06 LAB — RAPID URINE DRUG SCREEN, HOSP PERFORMED
Amphetamines: NOT DETECTED
BARBITURATES: NOT DETECTED
Benzodiazepines: NOT DETECTED
Cocaine: NOT DETECTED
Opiates: NOT DETECTED
Tetrahydrocannabinol: NOT DETECTED

## 2018-04-06 LAB — ACETAMINOPHEN LEVEL: Acetaminophen (Tylenol), Serum: 10 ug/mL (ref 10–30)

## 2018-04-06 LAB — SALICYLATE LEVEL: Salicylate Lvl: <7 mg/dL (ref 2.8–30.0)

## 2018-04-06 LAB — ETHANOL: Alcohol, Ethyl (B): <10 mg/dL (ref <10)

## 2018-04-06 LAB — LITHIUM LEVEL: Lithium Lvl: 0.96 mmol/L (ref 0.60–1.20)

## 2018-04-06 MED ORDER — LITHIUM CARBONATE 300 MG PO CAPS: 300.0000 mg | ORAL_CAPSULE | Freq: Two times a day (BID) | ORAL | Status: DC

## 2018-04-06 MED ORDER — PANTOPRAZOLE SODIUM 40 MG PO TBEC
40.0000 mg | DELAYED_RELEASE_TABLET | Freq: Every day | ORAL | Status: DC
  Administered 2018-04-06: 40 mg via ORAL
  Filled 2018-04-06: qty 1

## 2018-04-06 MED ORDER — ACETAMINOPHEN 325 MG PO TABS
975.0000 mg | ORAL_TABLET | Freq: Three times a day (TID) | ORAL | Status: DC
  Administered 2018-04-06: 975 mg via ORAL
  Filled 2018-04-06: qty 3

## 2018-04-06 MED ORDER — DIVALPROEX SODIUM 125 MG PO CSDR
500.0000 mg | DELAYED_RELEASE_CAPSULE | Freq: Two times a day (BID) | ORAL | Status: DC
  Administered 2018-04-06: 500 mg via ORAL
  Filled 2018-04-06 (×5): qty 4

## 2018-04-06 MED ORDER — ASPIRIN EC 325 MG PO TBEC
325.0000 mg | DELAYED_RELEASE_TABLET | Freq: Two times a day (BID) | ORAL | Status: DC
  Administered 2018-04-06: 325 mg via ORAL
  Filled 2018-04-06: qty 1

## 2018-04-06 MED ORDER — PANTOPRAZOLE SODIUM 40 MG PO TBEC
40.0000 mg | DELAYED_RELEASE_TABLET | Freq: Every day | ORAL | Status: DC
  Administered 2018-04-07 – 2018-04-11 (×5): 40 mg via ORAL
  Filled 2018-04-06 (×7): qty 1

## 2018-04-06 MED ORDER — OXYBUTYNIN CHLORIDE ER 10 MG PO TB24
10.0000 mg | ORAL_TABLET | Freq: Every day | ORAL | Status: DC
  Administered 2018-04-07 – 2018-04-11 (×5): 10 mg via ORAL
  Filled 2018-04-06 (×7): qty 1

## 2018-04-06 MED ORDER — NICOTINE 21 MG/24HR TD PT24
21.0000 mg | MEDICATED_PATCH | Freq: Every day | TRANSDERMAL | Status: DC
  Administered 2018-04-07: 21 mg via TRANSDERMAL
  Filled 2018-04-06 (×7): qty 1

## 2018-04-06 MED ORDER — ALBUTEROL SULFATE HFA 108 (90 BASE) MCG/ACT IN AERS: 2.0000 | INHALATION_SPRAY | Freq: Four times a day (QID) | RESPIRATORY_TRACT | Status: DC | PRN

## 2018-04-06 MED ORDER — PAROXETINE HCL 20 MG PO TABS
20.0000 mg | ORAL_TABLET | Freq: Every day | ORAL | Status: DC
  Filled 2018-04-06 (×2): qty 1

## 2018-04-06 MED ORDER — LITHIUM CARBONATE 150 MG PO CAPS
300.0000 mg | ORAL_CAPSULE | Freq: Two times a day (BID) | ORAL | Status: DC
  Administered 2018-04-06 – 2018-04-11 (×10): 300 mg via ORAL
  Filled 2018-04-06 (×15): qty 2

## 2018-04-06 MED ORDER — LINACLOTIDE 290 MCG PO CAPS
290.0000 ug | ORAL_CAPSULE | Freq: Every day | ORAL | Status: DC
  Filled 2018-04-06 (×2): qty 1

## 2018-04-06 MED ORDER — DIVALPROEX SODIUM 125 MG PO CSDR
500.0000 mg | DELAYED_RELEASE_CAPSULE | Freq: Two times a day (BID) | ORAL | Status: DC
  Filled 2018-04-06 (×4): qty 4

## 2018-04-06 MED ORDER — HYDROXYZINE HCL 25 MG PO TABS
25.0000 mg | ORAL_TABLET | Freq: Four times a day (QID) | ORAL | Status: DC | PRN
  Administered 2018-04-09 – 2018-04-10 (×2): 25 mg via ORAL
  Filled 2018-04-06 (×2): qty 1

## 2018-04-06 MED ORDER — SENNA 8.6 MG PO TABS
1.0000 | ORAL_TABLET | Freq: Every day | ORAL | Status: DC | PRN
  Filled 2018-04-06: qty 1

## 2018-04-06 MED ORDER — LINACLOTIDE 290 MCG PO CAPS
290.0000 ug | ORAL_CAPSULE | Freq: Every day | ORAL | Status: DC
  Filled 2018-04-06: qty 1

## 2018-04-06 MED ORDER — TIOTROPIUM BROMIDE MONOHYDRATE 18 MCG IN CAPS
1.0000 | ORAL_CAPSULE | Freq: Every day | RESPIRATORY_TRACT | Status: DC
  Filled 2018-04-06: qty 5

## 2018-04-06 MED ORDER — ASPIRIN EC 325 MG PO TBEC
325.0000 mg | DELAYED_RELEASE_TABLET | Freq: Two times a day (BID) | ORAL | Status: DC
  Administered 2018-04-07 – 2018-04-11 (×9): 325 mg via ORAL
  Filled 2018-04-06 (×13): qty 1

## 2018-04-06 MED ORDER — NAPROXEN 250 MG PO TABS: 500.0000 mg | ORAL_TABLET | Freq: Two times a day (BID) | ORAL | Status: DC | PRN

## 2018-04-06 MED ORDER — OLANZAPINE 2.5 MG PO TABS
2.5000 mg | ORAL_TABLET | Freq: Every day | ORAL | Status: DC
  Administered 2018-04-06 – 2018-04-10 (×5): 2.5 mg via ORAL
  Filled 2018-04-06 (×8): qty 1

## 2018-04-06 MED ORDER — OLANZAPINE 5 MG PO TABS: 2.5000 mg | ORAL_TABLET | Freq: Every day | ORAL | Status: DC

## 2018-04-06 MED ORDER — DOCUSATE SODIUM 100 MG PO CAPS
100.0000 mg | ORAL_CAPSULE | Freq: Every day | ORAL | Status: DC
  Administered 2018-04-07 – 2018-04-11 (×5): 100 mg via ORAL
  Filled 2018-04-06 (×7): qty 1

## 2018-04-06 MED ORDER — BACITRACIN ZINC 500 UNIT/GM EX OINT
1.0000 "application " | TOPICAL_OINTMENT | Freq: Once | CUTANEOUS | Status: AC
  Administered 2018-04-06: 1 via TOPICAL
  Filled 2018-04-06: qty 0.9

## 2018-04-06 MED ORDER — NAPROXEN 500 MG PO TABS: 500.0000 mg | ORAL_TABLET | Freq: Two times a day (BID) | ORAL | Status: DC | PRN

## 2018-04-06 MED ORDER — PAROXETINE HCL 20 MG PO TABS
30.0000 mg | ORAL_TABLET | Freq: Every day | ORAL | Status: DC
  Filled 2018-04-06 (×2): qty 1.5

## 2018-04-06 MED ORDER — HYDROXYZINE HCL 25 MG PO TABS: 25.0000 mg | ORAL_TABLET | Freq: Four times a day (QID) | ORAL | Status: DC | PRN

## 2018-04-06 MED ORDER — PAROXETINE HCL 30 MG PO TABS
30.0000 mg | ORAL_TABLET | Freq: Every day | ORAL | Status: DC
  Administered 2018-04-07 – 2018-04-11 (×5): 30 mg via ORAL
  Filled 2018-04-06 (×6): qty 1
  Filled 2018-04-06: qty 3
  Filled 2018-04-06: qty 1

## 2018-04-06 MED ORDER — DOCUSATE SODIUM 100 MG PO CAPS: 100.0000 mg | ORAL_CAPSULE | Freq: Every day | ORAL | Status: DC

## 2018-04-06 MED ORDER — HYDRALAZINE HCL 25 MG PO TABS
25.0000 mg | ORAL_TABLET | Freq: Four times a day (QID) | ORAL | Status: DC
  Administered 2018-04-06: 25 mg via ORAL
  Filled 2018-04-06: qty 1

## 2018-04-06 MED ORDER — UMECLIDINIUM BROMIDE 62.5 MCG/INH IN AEPB
1.0000 | INHALATION_SPRAY | Freq: Every day | RESPIRATORY_TRACT | Status: DC
  Administered 2018-04-08 – 2018-04-11 (×4): 1 via RESPIRATORY_TRACT
  Filled 2018-04-06: qty 7

## 2018-04-06 MED ORDER — TIOTROPIUM BROMIDE MONOHYDRATE 18 MCG IN CAPS: 1.0000 | ORAL_CAPSULE | Freq: Every day | RESPIRATORY_TRACT | Status: DC

## 2018-04-06 MED ORDER — OXYBUTYNIN CHLORIDE ER 5 MG PO TB24
10.0000 mg | ORAL_TABLET | Freq: Every day | ORAL | Status: DC
  Filled 2018-04-06 (×2): qty 2

## 2018-04-06 NOTE — Progress Notes (Signed)
Report received from [facility].  She was pleasant and cooperative on initial approach.  She requested her night medications and they were given per order.  She declined needing anything else at this time.  She is currently resting with her eyes closed and appears to be asleep.  We will continue to monitor the progress towards his goals.

## 2018-04-06 NOTE — Tx Team (Signed)
Initial Treatment Plan 2/90/2111 55:20 PM Karen Keller EYE:[id number]    PATIENT STRESSORS: Other: "frustration at my group home"   PATIENT STRENGTHS: Ability for insight Curator fund of knowledge Motivation for treatment/growth   PATIENT IDENTIFIED PROBLEMS: Depression Suicidal thoughts Visual hallucinations "Change my medications if it's needed"                     DISCHARGE CRITERIA:  Ability to meet basic life and health needs Improved stabilization in mood, thinking, and/or behavior Reduction of life-threatening or endangering symptoms to within safe limits Verbal commitment to aftercare and medication compliance  PRELIMINARY DISCHARGE PLAN: Attend aftercare/continuing care group Return to previous living arrangement  PATIENT/FAMILY INVOLVEMENT: This treatment plan has been presented to and reviewed with the patient, Karen Keller, and/or family member, .  The patient and family have been given the opportunity to ask questions and make suggestions.  [place], [place], [place] 04/06/2018, 10:22 PM

## 2018-04-06 NOTE — Progress Notes (Signed)
Karen Keller is a 63 year old female pt admitted on voluntary basis from [facility]. On admission, she reports that she has been feeling depressed and suicidal and spoke about how she has been feeling frustrated at the group home. She reports that she did make some cuts to left wrist and does have wrist bandaged on admission. She denies any SI currently and is able to contract for safety while in the hospital. She reports that she has been seeing things for about the past year. She reports that she takes her medications as prescribed. She denies any substance abuse issues. She reports that she lives at [facility] and reports that some of the "higher ups" are talking about her and making fun of her and reports that she is thinking about switching homes. Karen Keller was escorted to the unit, oriented to the milieu and safety maintained.

## 2018-04-06 NOTE — Progress Notes (Signed)
Patient ID: Karen Keller, female   DOB: 29-Mar-1955, 63 y.o.   MRN: [id number] Per State regulations 482.30 this chart was reviewed for medical necessity with respect to the patient's admission/duration of stay.    Next review date: 04/10/2018  Debarah Crape, BSN, RN-BC  Case Manager

## 2018-04-06 NOTE — ED Notes (Signed)
Pt given meal tray.
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
## 2018-04-06 NOTE — Progress Notes (Signed)
Pt accepted to [facility], Bed 504-1 Shuvon Rankin, NP is the accepting provider.   Johnn Hai, MD is the attending provider.  Call report to [phone]  @ [facility] notified.   Pt is Voluntary.  Pt may be transported by [facility]  Pt scheduled to arrive at [facility] @21 :00  Romie Minus T. Judi Cong, MSW, [place] Disposition Clinical Social Work [phone] (cell) [phone] (office)

## 2018-04-06 NOTE — ED Triage Notes (Signed)
Patient states "I tried to kill myself by cutting my wrist with a razor on Sunday." Caregiver is with patient and states patient is from [place] and she was told she just scratched herself. Patient has bandage from Dr Josephine Cables office over left wrist and was sent here by Dr Josephine Cables office.
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
## 2018-04-07 DIAGNOSIS — F322 Major depressive disorder, single episode, severe without psychotic features: Secondary | ICD-10-CM

## 2018-04-07 MED ORDER — TRAZODONE 25 MG HALF TABLET
25.0000 mg | ORAL_TABLET | Freq: Every day | ORAL | Status: DC
  Administered 2018-04-08 – 2018-04-09 (×2): 25 mg via ORAL
  Filled 2018-04-07 (×5): qty 1

## 2018-04-07 MED ORDER — DIVALPROEX SODIUM 500 MG PO DR TAB
500.0000 mg | DELAYED_RELEASE_TABLET | Freq: Two times a day (BID) | ORAL | Status: DC
  Administered 2018-04-07 – 2018-04-11 (×9): 500 mg via ORAL
  Filled 2018-04-07 (×15): qty 1

## 2018-04-07 MED ORDER — LINACLOTIDE 145 MCG PO CAPS
290.0000 ug | ORAL_CAPSULE | Freq: Every day | ORAL | Status: DC
  Administered 2018-04-07 – 2018-04-11 (×5): 290 ug via ORAL
  Filled 2018-04-07 (×8): qty 2

## 2018-04-07 MED ORDER — BUSPIRONE HCL 15 MG PO TABS
15.0000 mg | ORAL_TABLET | Freq: Three times a day (TID) | ORAL | Status: DC
  Administered 2018-04-07 – 2018-04-11 (×14): 15 mg via ORAL
  Filled 2018-04-07 (×20): qty 1

## 2018-04-07 MED ORDER — METOPROLOL SUCCINATE ER 100 MG PO TB24
100.0000 mg | ORAL_TABLET | Freq: Every day | ORAL | Status: DC
  Administered 2018-04-07 – 2018-04-08 (×2): 100 mg via ORAL
  Filled 2018-04-07 (×6): qty 1

## 2018-04-07 NOTE — Progress Notes (Signed)
Recreation Therapy Notes  Date: 1.30.20 Time: 0945 Location: [address] Dayroom  Group Topic: Wellness  Goal Area(s) Addresses:  Patient will define components of whole wellness. Patient will verbalize benefit of whole wellness.  Intervention:  Music  Activity:  Exercise.  LRT and patients went through a series of stretches.  Patients were then allowed to lead the group in an exercise of their choice.  Patients were allowed to rest when needed and take water breaks as needed.  Education: Wellness, Dentist.   Education Outcome: Acknowledges education/In group clarification offered/Needs additional education.   Clinical Observations/Feedback: Pt did not attend group.    Victorino Sparrow, LRT/CTRS         Victorino Sparrow A 04/07/2018 11:32 AM
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
## 2018-04-07 NOTE — [facility] Suicide Risk Assessment (Signed)
[facility] Admission Suicide Risk Assessment   Nursing information obtained from:  Patient Demographic factors:  Caucasian, Unemployed Current Mental Status:  Self-harm thoughts Loss Factors:  Decline in physical health, Financial problems / change in socioeconomic status Historical Factors:  Prior suicide attempts, Family history of mental illness or substance abuse Risk Reduction Factors:  Positive coping skills or problem solving skills  Total Time spent with patient: 30 minutes Principal Problem: <principal problem not specified> Diagnosis:  Active Problems:   MDD (major depressive disorder), severe (HCC)  Subjective Data: Patient described cutting her wrist stating she had felt overwhelmed but she denies wanting to harm herself today and can contract with me while here no acute psychosis states she is always in the past seen things but never heard things  Continued Clinical Symptoms:  Alcohol Use Disorder Identification Test Final Score (AUDIT): 0 The "Alcohol Use Disorders Identification Test", Guidelines for Use in Primary Care, Second Edition.  World Pharmacologist [facility]). Score between 0-7:  no or low risk or alcohol related problems. Score between 8-15:  moderate risk of alcohol related problems. Score between 16-19:  high risk of alcohol related problems. Score 20 or above:  warrants further diagnostic evaluation for alcohol dependence and treatment.   CLINICAL FACTORS:   More than one psychiatric diagnosis       COGNITIVE FEATURES THAT CONTRIBUTE TO RISK:  Loss of executive function    SUICIDE RISK:   Minimal: No identifiable suicidal ideation.  Patients presenting with no risk factors but with morbid ruminations; may be classified as minimal risk based on the severity of the depressive symptoms  PLAN OF CARE: Augment antidepressant therapy continue cognitive therapy  I certify that inpatient services furnished can reasonably be expected to improve the patient's  condition.   Johnn Hai, MD 04/07/2018, 8:56 AM

## 2018-04-07 NOTE — Progress Notes (Signed)
Recreation Therapy Notes  INPATIENT RECREATION THERAPY ASSESSMENT  Patient Details Name: Karen Keller MRN: [id number] DOB: November 23, 1955 Today's Date: 04/07/2018       Information Obtained From: Patient  Able to Participate in Assessment/Interview: Yes  Patient Presentation: Alert  Reason for Admission (Per Patient): Suicide Attempt  Patient Stressors: Other (Comment)(Living situation)  Coping Skills:   Self-Injury, TV, Exercise, Music, Prayer, Avoidance, Read, Hot Bath/Shower  Leisure Interests (2+):  Exercise - Walking, Individual - Other (Comment)(Smoking cigarettes)  Frequency of Recreation/Participation: Other (Comment)(Daily)  Awareness of Community Resources:  No  Expressed Interest in [place]: No  [place] of Residence:  [place]  Patient Main Form of Transportation: Other (Comment)(Facility vehicles)  Patient Strengths:  Good thinker  Patient Identified Areas of Improvement:  Coping skills  Patient Goal for Hospitalization:  "Feel better about myself"  Current SI (including self-harm):  No  Current HI:  No  Current AVH: No  Staff Intervention Plan: Group Attendance, Collaborate with Interdisciplinary Treatment Team  Consent to Intern Participation: N/A    Victorino Sparrow, LRT/CTRS  Victorino Sparrow A 04/07/2018, 11:45 AM
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
## 2018-04-07 NOTE — Progress Notes (Signed)
CSW spoke with Karen Keller of [place] does not have a legal guardian at this point, may have in the past. Pt is able to return to the home when she is discharged form [facility]. Winferd Humphrey, MSW, LCSW Clinical Social Worker 04/07/2018 11:44 AM
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
## 2018-04-07 NOTE — Progress Notes (Signed)
Adult Psychoeducational Group Note  Date:  04/07/2018 Time:  8:32 PM  Group Topic/Focus:  Wrap-Up Group:   The focus of this group is to help patients review their daily goal of treatment and discuss progress on daily workbooks.  Participation Level:  Active  Participation Quality:  Appropriate  Affect:  Appropriate  Cognitive:  Appropriate  Insight: Appropriate  Engagement in Group:  Engaged  Modes of Intervention:  Discussion  Additional Comments: The p[atient expressed that she  rates today a 6.The patient also said that she attended groups.  Nash Shearer 04/07/2018, 8:32 PM

## 2018-04-08 NOTE — Tx Team (Signed)
Interdisciplinary Treatment and Diagnostic Plan Update  04/08/2018 Time of Session: 60:45 AM Karen Keller MRN: [id number]  Principal Diagnosis: <principal problem not specified>  Secondary Diagnoses: Active Problems:   MDD (major depressive disorder), severe (HCC)   Current Medications:  Current Facility-Administered Medications  Medication Dose Route Frequency Provider Last Rate Last Dose   albuterol (PROVENTIL HFA;VENTOLIN HFA) 108 (90 Base) MCG/ACT inhaler 2 puff  2 puff Inhalation QID PRN Rankin, Shuvon B, NP       aspirin EC tablet 325 mg  325 mg Oral BID Rankin, Shuvon B, NP   325 mg at 04/08/18 0732   busPIRone (BUSPAR) tablet 15 mg  15 mg Oral TID Johnn Hai, MD   15 mg at 04/08/18 7829   divalproex (DEPAKOTE) DR tablet 500 mg  500 mg Oral BID Johnn Hai, MD   500 mg at 04/08/18 0732   docusate sodium (COLACE) capsule 100 mg  100 mg Oral Daily Rankin, Shuvon B, NP   100 mg at 04/08/18 0732   hydrOXYzine (ATARAX/VISTARIL) tablet 25 mg  25 mg Oral Q6H PRN Rankin, Shuvon B, NP       linaclotide (LINZESS) capsule 290 mcg  290 mcg Oral QAC breakfast Johnn Hai, MD   290 mcg at 04/08/18 0732   lithium carbonate capsule 300 mg  300 mg Oral BID Rankin, Shuvon B, NP   300 mg at 04/08/18 0733   metoprolol succinate (TOPROL-XL) 24 hr tablet 100 mg  100 mg Oral Daily Johnn Hai, MD   100 mg at 04/08/18 0936   naproxen (NAPROSYN) tablet 500 mg  500 mg Oral BID PRN Rankin, Shuvon B, NP       nicotine (NICODERM CQ - dosed in mg/24 hours) patch 21 mg  21 mg Transdermal Daily Johnn Hai, MD   21 mg at 04/07/18 0836   OLANZapine (ZYPREXA) tablet 2.5 mg  2.5 mg Oral QHS Rankin, Shuvon B, NP   2.5 mg at 04/07/18 2053   oxybutynin (DITROPAN-XL) 24 hr tablet 10 mg  10 mg Oral Daily Rankin, Shuvon B, NP   10 mg at 04/08/18 0732   pantoprazole (PROTONIX) EC tablet 40 mg  40 mg Oral Daily Rankin, Shuvon B, NP   40 mg at 04/08/18 0732   PARoxetine (PAXIL) tablet 30 mg  30 mg  Oral Daily Rankin, Shuvon B, NP   30 mg at 04/08/18 0732   traZODone (DESYREL) tablet 25 mg  25 mg Oral QHS Simon, Spencer E, PA-C       umeclidinium bromide (INCRUSE ELLIPTA) 62.5 MCG/INH 1 puff  1 puff Inhalation Daily Lenis Noon, RPH   1 puff at 04/08/18 5621   PTA Medications: Medications Prior to Admission  Medication Sig Dispense Refill Last Dose   acetaminophen (TYLENOL) 325 MG tablet Take 975 mg by mouth every 8 (eight) hours. (0600, 1400, 2200)   04/06/2018 at 600a   albuterol (PROAIR HFA) 108 (90 Base) MCG/ACT inhaler Inhale 2 puffs into the lungs 4 (four) times daily as needed for wheezing or shortness of breath. (0800, 1200, 1600, 2000)   unknown   B Complex-Biotin-FA (B-COMPLEX PO) Take 1 tablet by mouth daily. (0800)   04/06/2018 at Unknown time   calcium-vitamin D (OSCAL WITH D) 500-200 MG-UNIT tablet Take 1 tablet by mouth 2 (two) times daily with a meal. (1200 & 1700)   04/05/2018 at Unknown time   divalproex (DEPAKOTE SPRINKLES) 125 MG capsule Take 500 mg by mouth 2 (two) times daily. (0800 &  2000)   04/06/2018 at 800a   docusate sodium (COLACE) 100 MG capsule Take 1 capsule (100 mg total) by mouth daily. (0800) 10 capsule 0 04/06/2018 at Unknown time   hydrALAZINE (APRESOLINE) 25 MG tablet Take 25 mg by mouth 4 (four) times daily. (0800, 1200, 1600, 2000)   04/06/2018 at 800a   hydrOXYzine (ATARAX/VISTARIL) 25 MG tablet Take 25 mg by mouth every 6 (six) hours as needed for anxiety.   unknown   linaclotide (LINZESS) 290 MCG CAPS capsule Take 1 capsule (290 mcg total) by mouth daily before breakfast. (0700) 90 capsule 3 04/06/2018 at Unknown time   lithium 300 MG tablet Take 300 mg by mouth 2 (two) times daily. (0800 & 2000)   04/06/2018 at 800a   LORazepam (ATIVAN) 0.5 MG tablet Take 0.25 mg by mouth every 12 (twelve) hours as needed for anxiety.    unknown   Multiple Vitamin (MULTIVITAMIN WITH MINERALS) TABS tablet Take 1 tablet by mouth daily. (0800)   04/06/2018 at  Unknown time   naproxen (NAPROSYN) 500 MG tablet Take 500 mg by mouth 2 (two) times daily as needed (chest pain).   unknown   OLANZapine (ZYPREXA) 2.5 MG tablet Take 2.5 mg by mouth at bedtime. (2000)   04/05/2018 at Unknown time   omeprazole (PRILOSEC) 40 MG capsule Take 40 mg by mouth daily.   04/06/2018 at Unknown time   oxybutynin (DITROPAN-XL) 10 MG 24 hr tablet Take 10 mg by mouth daily. (0800)   04/06/2018 at Unknown time   PARoxetine (PAXIL) 20 MG tablet Take 20 mg by mouth at bedtime.   04/05/2018 at Unknown time   PARoxetine (PAXIL) 30 MG tablet Take 30 mg by mouth daily. (0800)   04/06/2018 at Unknown time   ranitidine (ZANTAC) 150 MG tablet Take 1 tablet (150 mg total) by mouth at bedtime. 30 tablet 0    senna (SENOKOT) 8.6 MG TABS tablet Take 1 tablet by mouth daily as needed (for constipation).   unknown   SPIRIVA HANDIHALER 18 MCG inhalation capsule Place 1 capsule into inhaler and inhale daily.   04/06/2018 at Unknown time   traMADol (ULTRAM) 50 MG tablet Take 1 tablet (50 mg total) by mouth every 6 (six) hours as needed. (Patient taking differently: Take 50 mg by mouth 2 (two) times daily. (0800 & 2000)) 15 tablet 0 04/06/2018 at Unknown time    Patient Stressors: Other: "frustration at my group home"  Patient Strengths: Ability for insight Communication skills General fund of knowledge Motivation for treatment/growth  Treatment Modalities: Medication Management, Group therapy, Case management,  1 to 1 session with clinician, Psychoeducation, Recreational therapy.   Physician Treatment Plan for Primary Diagnosis: <principal problem not specified> Long Term Goal(s): Improvement in symptoms so as ready for discharge Improvement in symptoms so as ready for discharge   Short Term Goals: Ability to identify and develop effective coping behaviors will improve Ability to maintain clinical measurements within normal limits will improve Compliance with prescribed medications  will improve Ability to disclose and discuss suicidal ideas Ability to demonstrate self-control will improve Ability to identify and develop effective coping behaviors will improve  Medication Management: Evaluate patient's response, side effects, and tolerance of medication regimen.  Therapeutic Interventions: 1 to 1 sessions, Unit Group sessions and Medication administration.  Evaluation of Outcomes: Not Met  Physician Treatment Plan for Secondary Diagnosis: Active Problems:   MDD (major depressive disorder), severe ([place])  Long Term Goal(s): Improvement in symptoms so as ready for discharge  Improvement in symptoms so as ready for discharge   Short Term Goals: Ability to identify and develop effective coping behaviors will improve Ability to maintain clinical measurements within normal limits will improve Compliance with prescribed medications will improve Ability to disclose and discuss suicidal ideas Ability to demonstrate self-control will improve Ability to identify and develop effective coping behaviors will improve     Medication Management: Evaluate patient's response, side effects, and tolerance of medication regimen.  Therapeutic Interventions: 1 to 1 sessions, Unit Group sessions and Medication administration.  Evaluation of Outcomes: Not Met   RN Treatment Plan for Primary Diagnosis: <principal problem not specified> Long Term Goal(s): Knowledge of disease and therapeutic regimen to maintain health will improve  Short Term Goals: Ability to remain free from injury will improve, Ability to participate in decision making will improve and Compliance with prescribed medications will improve  Medication Management: RN will administer medications as ordered by provider, will assess and evaluate patient's response and provide education to patient for prescribed medication. RN will report any adverse and/or side effects to prescribing provider.  Therapeutic Interventions: 1  on 1 counseling sessions, Psychoeducation, Medication administration, Evaluate responses to treatment, Monitor vital signs and CBGs as ordered, Perform/monitor CIWA, COWS, AIMS and Fall Risk screenings as ordered, Perform wound care treatments as ordered.  Evaluation of Outcomes: Progressing   LCSW Treatment Plan for Primary Diagnosis: <principal problem not specified> Long Term Goal(s): Safe transition to appropriate next level of care at discharge, Engage patient in therapeutic group addressing interpersonal concerns.  Short Term Goals: Engage patient in aftercare planning with referrals and resources, Increase social support and Increase skills for wellness and recovery  Therapeutic Interventions: Assess for all discharge needs, 1 to 1 time with Social worker, Explore available resources and support systems, Assess for adequacy in community support network, Educate family and significant other(s) on suicide prevention, Complete Psychosocial Assessment, Interpersonal group therapy.  Evaluation of Outcomes: Progressing   Progress in Treatment: Attending groups: No. Participating in groups: No. Taking medication as prescribed: Yes. Toleration medication: Yes. Family/Significant other contact made: No, patient declined. Patient understands diagnosis: Yes. Discussing patient identified problems/goals with staff: Yes. Medical problems stabilized or resolved: No. Denies suicidal/homicidal ideation: No. Issues/concerns per patient self-inventory: No. Other:   New problem(s) identified: No, Describe:  None reported  New Short Term/Long Term Goal(s): Attend and participate in Group  Patient Goals:  "To get out of this slump"  Discharge Plan or Barriers: None reported  Reason for Continuation of Hospitalization: Depression  Estimated Length of Stay: 2-4 days  Attendees: Patient: 04/08/2018 10:01 AM  Physician:  04/08/2018 10:01 AM  Nursing:  04/08/2018 10:01 AM  RN Care Manager:  04/08/2018 10:01 AM  Social Worker:  04/08/2018 10:01 AM  Recreational Therapist:  04/08/2018 10:01 AM  Other:  04/08/2018 10:01 AM  Other:  04/08/2018 10:01 AM  Other: 04/08/2018 10:01 AM    Scribe for Treatment Team: Lawana Pai, MSW Intern [place] Department 04/08/2018 10:01 AM

## 2018-04-08 NOTE — Progress Notes (Signed)
D: Pt denies SI/HI/AVH. Pt is pleasant and cooperative. Pt concerned about calling group homes herself. Pt was encouraged to come to staff to assist her with making calls. Pt concerned about doing this herself . Pt worried about going back to the old group home, pt stated she did not know what she would do if she had to go back to that same group home.  A: Pt was offered support and encouragement. Pt was given scheduled medications. Pt was encourage to attend groups. Q 15 minute checks were done for safety.  R:Pt attends groups and interacts well with peers and staff. Pt is taking medication. Pt receptive to treatment and safety maintained on unit.  Problem: Education: Goal: Knowledge of the prescribed therapeutic regimen will improve Outcome: Progressing   Problem: Activity: Goal: Interest or engagement in leisure activities will improve Outcome: Progressing   Problem: Coping: Goal: Coping ability will improve Outcome: Progressing   Problem: Coping: Goal: Will verbalize feelings Outcome: Progressing

## 2018-04-08 NOTE — Progress Notes (Signed)
CSW spoke with [name], [phone], owner and operator of the group home in which patient resides in [place].   [name] confirms patient DOES NOT have a legal guardian.  [name] confirms patient follows up with [facility] in [place] for outpatient.  Stephanie Acre, LCSW-A Clinical Social Worker

## 2018-04-08 NOTE — Progress Notes (Signed)
Recreation Therapy Notes  Date: 1.31.20 Time: 1000 Location: [address] Dayroom  Group Topic: Self-Esteem  Goal Area(s) Addresses:  Patient will successfully identify positive attributes about themselves.  Patient will successfully identify benefit of improved self-esteem.   Behavioral Response: Engaged  Intervention: Visual merchandiser, magazines, scissors, glue sticks, music, colored pencils  Activity: Collage.  Patients were to use the supplies provided to create a collage that highlighted positive qualities about themselves as well as things that are important to them.  Education:  Self-Esteem, Dentist.   Education Outcome: Acknowledges education/In group clarification offered/Needs additional education  Clinical Observations/Feedback:  Pt was singing along to some of the songs.  Pt was bright and attentive.  Pt used a picture that represented the home she grew up in as a child, a dog because she likes dogs and eggs with different faces on them because "that's how I feel right now".   Victorino Sparrow, LRT/CTRS         Victorino Sparrow A 04/08/2018 11:32 AM

## 2018-04-08 NOTE — [facility] Suicide Risk Assessment (Signed)
[facility] INPATIENT:  Family/Significant Other Suicide Prevention Education  Suicide Prevention Education:  Patient Refusal for Family/Significant Other Suicide Prevention Education: The patient Karen Keller has refused to provide written consent for family/significant other to be provided Family/Significant Other Suicide Prevention Education during admission and/or prior to discharge.  Physician notified.  Lawana Pai, MSW Intern [place] Department 04/08/2018, 6:14 PM
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
## 2018-04-08 NOTE — [facility] Counselor (Signed)
This Probation officer provided a list of group homes with patient chart after [facility] requested assistance for a different group home.   Lawana Pai, MSW [facility] [place] [facility] 04/08/2018 4:30 PM

## 2018-04-08 NOTE — Progress Notes (Signed)
Pt presents with a flat affect and a depressed mood. Pt expressed that she doesn't want to return back to the same groups home and would like to find a new one. Pt expressed that she's saddened that no one is able to place her into a new facility. Pt reported having increased anxiety and depression. Pt denies SI/HI. Pt denies AVH.  Medications reviewed with pt. Verbal support provided. Pt encouraged to attend groups. 15 minute checks performed for safety.  Pt compliant with tx plan.

## 2018-04-09 DIAGNOSIS — T1491XA Suicide attempt, initial encounter: Secondary | ICD-10-CM

## 2018-04-09 DIAGNOSIS — Z593 Problems related to living in residential institution: Secondary | ICD-10-CM

## 2018-04-09 DIAGNOSIS — F339 Major depressive disorder, recurrent, unspecified: Secondary | ICD-10-CM

## 2018-04-09 MED ORDER — METOPROLOL SUCCINATE ER 50 MG PO TB24
50.0000 mg | ORAL_TABLET | Freq: Every day | ORAL | Status: DC
  Administered 2018-04-11: 50 mg via ORAL
  Filled 2018-04-09 (×4): qty 1

## 2018-04-09 MED ORDER — TRAZODONE HCL 50 MG PO TABS: 25.0000 mg | ORAL_TABLET | Freq: Every evening | ORAL | Status: DC | PRN
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
## 2018-04-09 NOTE — Progress Notes (Signed)
D: Pt denies SI/HI/AVH. Pt is pleasant and cooperative. Pt stated she was feeling sad about having to go back to the [facility]. Pt stated she has and appointment with [facility] , but did not know when it was, but hoped that they could help her find a place   A: Pt was offered support and encouragement. Pt was given scheduled medications. Pt was encourage to attend groups. Q 15 minute checks were done for safety.   R:Pt attends groups and interacts well with peers and staff. Pt is taking medication. Pt receptive to treatment and safety maintained on unit.   Problem: Coping: Goal: Coping ability will improve Outcome: Progressing   Problem: Coping: Goal: Will verbalize feelings Outcome: Progressing
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
## 2018-04-09 NOTE — Plan of Care (Signed)
D: Patient presents depressed, anxious, tremulous. She reports having a prior diagnosis of Parkinson's without treatment prior. Patient denies SI/HI/AVH. She denies nicotine cravings and refused a nicotine patch. She does report depression, hopelessness and anxiety 5/10. She slept poorly last night and was "up all night". She received medication for sleep, but did not find it helpful. Her appetite is good, energy hyper, and concentration good. She denies physical symptoms or withdrawal complaints. A: Patient checked q15 min, and checks reviewed. Reviewed medication changes with patient and educated on side effects. Educated patient on importance of attending group therapy sessions and educated on several coping skills. Encouarged participation in milieu through recreation therapy and attending meals with peers. Support and encouragement provided. Fluids offered. R: Patient receptive to education on medications, and is medication compliant. Patient contracts for safety on the unit. "Make it to the groups" and "try to stay awake."  Problem: Medication: Goal: Compliance with prescribed medication regimen will improve Outcome: Progressing   Problem: Self-Concept: Goal: Will verbalize positive feelings about self Outcome: Not Progressing Goal: Level of anxiety will decrease Outcome: Not Progressing   Problem: Coping: Goal: Coping ability will improve Outcome: Not Progressing
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
## 2018-04-09 NOTE — Progress Notes (Signed)
Held metoprolol XL 100mg  this am due to HR 43-48 bpm. Notified provider immediately, Mallie Darting, MD. He plans to review. Will monitor closely. Patient in NAD, denies dizziness or lightheadedness. BP stable 148/69.

## 2018-04-10 LAB — COMPREHENSIVE METABOLIC PANEL
ALK PHOS: 111 U/L (ref 38–126)
ALT: 52 U/L — ABNORMAL HIGH (ref 0–44)
AST: 65 U/L — ABNORMAL HIGH (ref 15–41)
Albumin: 3.5 g/dL (ref 3.5–5.0)
Anion gap: 7 (ref 5–15)
BUN: 32 mg/dL — ABNORMAL HIGH (ref 8–23)
CO2: 26 mmol/L (ref 22–32)
Calcium: 9.7 mg/dL (ref 8.9–10.3)
Chloride: 108 mmol/L (ref 98–111)
Creatinine, Ser: 1.2 mg/dL — ABNORMAL HIGH (ref 0.44–1.00)
GFR calc Af Amer: 55 mL/min — ABNORMAL LOW (ref >60)
GFR, EST NON AFRICAN AMERICAN: 48 mL/min — AB (ref >60)
Glucose, Bld: 102 mg/dL — ABNORMAL HIGH (ref 70–99)
Potassium: 4.8 mmol/L (ref 3.5–5.1)
Sodium: 141 mmol/L (ref 135–145)
Total Bilirubin: 0.5 mg/dL (ref 0.3–1.2)
Total Protein: 6.6 g/dL (ref 6.5–8.1)

## 2018-04-10 LAB — CBC WITH DIFFERENTIAL/PLATELET
Abs Immature Granulocytes: 0.09 10*3/uL — ABNORMAL HIGH (ref 0.00–0.07)
Basophils Absolute: 0.1 10*3/uL (ref 0.0–0.1)
Basophils Relative: 1 %
Eosinophils Absolute: 0.3 10*3/uL (ref 0.0–0.5)
Eosinophils Relative: 4 %
HCT: 39 % (ref 36.0–46.0)
HEMOGLOBIN: 12.1 g/dL (ref 12.0–15.0)
Immature Granulocytes: 1 %
LYMPHS PCT: 42 %
Lymphs Abs: 2.7 10*3/uL (ref 0.7–4.0)
MCH: 34 pg (ref 26.0–34.0)
MCHC: 31 g/dL (ref 30.0–36.0)
MCV: 109.6 fL — ABNORMAL HIGH (ref 80.0–100.0)
Monocytes Absolute: 0.8 10*3/uL (ref 0.1–1.0)
Monocytes Relative: 12 %
Neutro Abs: 2.6 10*3/uL (ref 1.7–7.7)
Neutrophils Relative %: 40 %
Platelets: 230 10*3/uL (ref 150–400)
RBC: 3.56 MIL/uL — ABNORMAL LOW (ref 3.87–5.11)
RDW: 13.1 % (ref 11.5–15.5)
WBC: 6.5 10*3/uL (ref 4.0–10.5)
nRBC: 0 % (ref 0.0–0.2)

## 2018-04-10 LAB — VITAMIN B12: Vitamin B-12: 846 pg/mL (ref 180–914)

## 2018-04-10 LAB — LITHIUM LEVEL: Lithium Lvl: 0.72 mmol/L (ref 0.60–1.20)

## 2018-04-10 LAB — FOLATE: Folate: 11.4 ng/mL (ref >5.9)

## 2018-04-10 LAB — VALPROIC ACID LEVEL: Valproic Acid Lvl: 54 ug/mL (ref 50.0–100.0)

## 2018-04-10 MED ORDER — TRAZODONE HCL 50 MG PO TABS
50.0000 mg | ORAL_TABLET | Freq: Every day | ORAL | Status: DC
  Administered 2018-04-10: 50 mg via ORAL
  Filled 2018-04-10 (×4): qty 1

## 2018-04-10 NOTE — Progress Notes (Signed)
DAR NOTE: Pt present with flat affect and depressed mood in the unit. Pt has been cooperative and appropriate in the unit. Pt observed in the dayroom watching TV with peers with no problems. Pt denies physical pain, took all her meds as scheduled. As per self inventory, pt had a good night sleep, good appetite, normal energy, and good concentration. Pt rate depression at 05, hopeless ness at 05, and anxiety at 5. Pt's safety ensured with 15 minute and environmental checks. Pt currently denies SI/HI and A/V hallucinations. Pt verbally agrees to seek staff if SI/HI or A/VH occurs and to consult with staff before acting on these thoughts. Will continue POC.

## 2018-04-10 NOTE — Progress Notes (Signed)
Patient ID: Karen Keller, female   DOB: Aug 19, 1955, 63 y.o.   MRN: [id number] Per State regulations 482.30 this chart was reviewed for medical necessity with respect to the patient's admission/duration of stay.    Next review date: 04/14/2018  Debarah Crape, BSN, RN-BC  Case Manager

## 2018-04-10 NOTE — Progress Notes (Signed)
D: Pt denies SI/HI/AVH. Pt is pleasant and cooperative. Pt stated she was concerned about going back to the group home.  A: Pt was offered support and encouragement. Pt was given scheduled medications. Pt was encourage to attend groups. Q 15 minute checks were done for safety.  R:Pt attends groups and interacts well with peers and staff. Pt is taking medication. Pt has no complaints.Pt receptive to treatment and safety maintained on unit.  Problem: Activity: Goal: Imbalance in normal sleep/wake cycle will improve Outcome: Progressing   Problem: Coping: Goal: Coping ability will improve Outcome: Progressing

## 2018-04-10 NOTE — [facility] Group Notes (Signed)
[facility] LCSW Group Therapy Note  Date/Time:  04/10/2018  11:00AM-12:00PM  Type of Therapy and Topic:  Group Therapy:  Music and Mood  Participation Level:  Minimal   Description of Group: In this process group, members listened to a variety of genres of music and identified that different types of music evoke different responses.  Patients were encouraged to identify music that was soothing for them and music that was energizing for them.  Patients discussed how this knowledge can help with wellness and recovery in various ways including managing depression and anxiety as well as encouraging healthy sleep habits.    Therapeutic Goals: 1. Patients will explore the impact of different varieties of music on mood 2. Patients will verbalize the thoughts they have when listening to different types of music 3. Patients will identify music that is soothing to them as well as music that is energizing to them 4. Patients will discuss how to use this knowledge to assist in maintaining wellness and recovery 5. Patients will explore the use of music as a coping skill  Summary of Patient Progress:  Patient expressed that a couple of songs were not to her liking, but the remainder were very enjoyable for her.  She appeared to go to sleep at one point, and had to be roused to go speak with the doctor.  Therapeutic Modalities: Solution Focused Brief Therapy Activity   Selmer Dominion, LCSW
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
## 2018-04-11 DIAGNOSIS — F259 Schizoaffective disorder, unspecified: Secondary | ICD-10-CM

## 2018-04-11 MED ORDER — DIVALPROEX SODIUM 500 MG PO DR TAB: 500.0000 mg | DELAYED_RELEASE_TABLET | Freq: Two times a day (BID) | ORAL | 0 refills | Status: DC

## 2018-04-11 MED ORDER — OLANZAPINE 2.5 MG PO TABS: 2.5000 mg | ORAL_TABLET | Freq: Every day | ORAL | 0 refills | Status: DC

## 2018-04-11 MED ORDER — ASPIRIN 325 MG PO TBEC: 325.0000 mg | DELAYED_RELEASE_TABLET | Freq: Two times a day (BID) | ORAL | 0 refills | Status: DC

## 2018-04-11 MED ORDER — DOCUSATE SODIUM 100 MG PO CAPS: 100.0000 mg | ORAL_CAPSULE | Freq: Every day | ORAL | 0 refills | Status: DC

## 2018-04-11 MED ORDER — LINACLOTIDE 290 MCG PO CAPS: 290.0000 ug | ORAL_CAPSULE | Freq: Every day | ORAL | 0 refills | Status: DC

## 2018-04-11 MED ORDER — NICOTINE 21 MG/24HR TD PT24: 21.0000 mg | MEDICATED_PATCH | Freq: Every day | TRANSDERMAL | 0 refills | Status: DC

## 2018-04-11 MED ORDER — TRAZODONE HCL 50 MG PO TABS: 50.0000 mg | ORAL_TABLET | Freq: Every day | ORAL | 0 refills | Status: DC

## 2018-04-11 MED ORDER — BUSPIRONE HCL 15 MG PO TABS: 15.0000 mg | ORAL_TABLET | Freq: Three times a day (TID) | ORAL | 0 refills | Status: DC

## 2018-04-11 MED ORDER — OXYBUTYNIN CHLORIDE ER 10 MG PO TB24: 10.0000 mg | ORAL_TABLET | Freq: Every day | ORAL | Status: DC

## 2018-04-11 MED ORDER — LITHIUM CARBONATE 300 MG PO CAPS: 300.0000 mg | ORAL_CAPSULE | Freq: Two times a day (BID) | ORAL | 0 refills | Status: DC

## 2018-04-11 MED ORDER — ALBUTEROL SULFATE HFA 108 (90 BASE) MCG/ACT IN AERS: 2.0000 | INHALATION_SPRAY | Freq: Four times a day (QID) | RESPIRATORY_TRACT | Status: DC | PRN

## 2018-04-11 MED ORDER — PAROXETINE HCL 30 MG PO TABS: 30.0000 mg | ORAL_TABLET | Freq: Every day | ORAL | 0 refills | Status: DC

## 2018-04-11 MED ORDER — METOPROLOL SUCCINATE ER 50 MG PO TB24: 50.0000 mg | ORAL_TABLET | Freq: Every day | ORAL | Status: DC

## 2018-04-11 MED ORDER — OMEPRAZOLE 40 MG PO CPDR: 40.0000 mg | DELAYED_RELEASE_CAPSULE | Freq: Every day | ORAL | 0 refills | Status: DC

## 2018-04-11 MED ORDER — UMECLIDINIUM BROMIDE 62.5 MCG/INH IN AEPB: 1.0000 | INHALATION_SPRAY | Freq: Every day | RESPIRATORY_TRACT | Status: DC
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
## 2018-04-11 NOTE — Progress Notes (Signed)
Recreation Therapy Notes  INPATIENT RECREATION TR PLAN  Patient Details Name: Karen Keller MRN: [id number] DOB: Feb 13, 1956 Today's Date: 04/11/2018  Rec Therapy Plan Is patient appropriate for Therapeutic Recreation?: Yes Treatment times per week: about 3 days  Estimated Length of Stay: 5-7 days TR Treatment/Interventions: Group participation (Comment)  Discharge Criteria Pt will be discharged from therapy if:: Discharged Treatment plan/goals/alternatives discussed and agreed upon by:: Patient/family  Discharge Summary Short term goals set: See patient care plan Short term goals met: Complete Progress toward goals comments: Groups attended Which groups?: Self-esteem, Other (Comment)(Anxiety) Reason goals not met: None Therapeutic equipment acquired: N/A Reason patient discharged from therapy: Discharge from hospital Pt/family agrees with progress & goals achieved: Yes Date patient discharged from therapy: 04/11/18     Victorino Sparrow, LRT/CTRS  Ria Comment, [place] 04/11/2018, 12:00 PM
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
## 2018-04-11 NOTE — Progress Notes (Signed)
Patient ID: Karen Keller, female   DOB: 1956/02/15, 63 y.o.   MRN: [id number] Patient denies SI, HI and AVH.  Patient discharged in the presence of her group home staff.  Patient acknowledged receipt of all belongings and understanding of discharge instructions.

## 2018-04-11 NOTE — Plan of Care (Signed)
Pt was able to identify positive traits about herself at completion of recreation therapy group sessions.   Victorino Sparrow, LRT/CTRS

## 2018-04-11 NOTE — BHH Suicide Risk Assessment (Signed)
[facility] Discharge Suicide Risk Assessment   Principal Problem: <principal problem not specified> Discharge Diagnoses: Active Problems:   MDD (major depressive disorder), severe ([place])   Total Time spent with patient: 45 minutes  Alert and oriented no thoughts of self-harm generally stable in mood and affect  Mental Status Per Nursing Assessment::   On Admission:  Self-harm thoughts  Demographic Factors:  Caucasian  Loss Factors: Decrease in vocational status  Historical Factors: NA  Risk Reduction Factors:   Positive social support  Continued Clinical Symptoms:  Depression:   Severe  Cognitive Features That Contribute To Risk:  None    Suicide Risk:  Minimal: No identifiable suicidal ideation.  Patients presenting with no risk factors but with morbid ruminations; may be classified as minimal risk based on the severity of the depressive symptoms  Follow-up Information    [facility] Follow up on 04/21/2018.   Why:  Hospital follow up appointment is 2/13 at 1:30p. Please bring your photo ID, proof of insurance, SSN, current medications and discharge paperwork from this hospitalization.  Contact information: [address] [place] [address] [phone]           Plan Of Care/Follow-up recommendations:  Activity:  full  Mekiyah Gladwell, MD 04/11/2018, 8:25 AM

## 2018-04-11 NOTE — Progress Notes (Signed)
  [facility] Adult Case Management Discharge Plan :  Will you be returning to the same living situation after discharge:  Yes,  [facility] At discharge, do you have transportation home?: Yes,  [facility] Do you have the ability to pay for your medications: Yes,  medicare  Release of information consent forms completed and in the chart;  Patient's signature needed at discharge.  Patient to Follow up at: Follow-up Information    [facility] Follow up on 04/21/2018.   Why:  Hospital follow up appointment is 2/13 at 1:30p. Please bring your photo ID, proof of insurance, SSN, current medications and discharge paperwork from this hospitalization.  Contact information: [place] [address] [phone]           Next level of care provider has access to [place] and Suicide Prevention discussed: No. Pt declined consent.   Have you used any form of tobacco in the last 30 days? (Cigarettes, Smokeless Tobacco, Cigars, and/or Pipes): Yes  Has patient been referred to the Quitline?: Patient refused referral  Patient has been referred for addiction treatment: N/A  Joanne Chars, [place] 04/11/2018, 10:03 AM

## 2018-04-11 NOTE — Progress Notes (Signed)
Recreation Therapy Notes  Date: 2.3.20 Time: 1000 Location: [address] Dayroom  Group Topic: Anxiety  Goal Area(s) Addresses:  Patient will identify triggers for anxiety.  Patient will identify physical symptoms to anxiety.  Patient will identify coping skills to deal with anxiety.   Behavioral Response: Engaged  Intervention:  Worksheet, pencils  Activity:  Introduction to Anxiety.  Patients were to identify at least 3 things that trigger anxiety, physical symptoms they have when anxious, thoughts they have when anxious and coping skills the use to deal with anxiety.  Education: Anger Management, Discharge Planning   Education Outcome: Acknowledges education/In group clarification offered/Needs additional education.   Clinical Observations/Feedback: Patient stated her triggers as "negative talking down to me".  Pt identified her physical symptoms as sweating and heart rate increases.  Pt stated her thoughts as to get a lawyer and kill herself and her coping skills were watching tv and listening to music.    Victorino Sparrow, LRT/CTRS       Victorino Sparrow A 04/11/2018 11:24 AM

## 2018-04-11 NOTE — NC FL2 (Signed)
[place] LEVEL OF CARE SCREENING TOOL     IDENTIFICATION  Patient Name: Karen Keller Birthdate: 11-04-1955 Sex: female Admission Date (Current Location): 04/06/2018  [facility] and [place] Number:  Herbalist and Address:  ([address] [place], [place] [address])      Provider Number: ([place], [place])  Attending Physician Name and Address:  Johnn Hai, MD  Relative Name and Phone Number:  None listed    Current Level of Care: Hospital Recommended Level of Care: [facility] Prior Approval Number:    Date Approved/Denied:   PASRR Number:    Discharge Plan: Other (Comment)(Family Care home)    Current Diagnoses: Patient Active Problem List   Diagnosis Date Noted  . MDD (major depressive disorder), severe ([place]) 04/06/2018  . Macrocytosis 02/16/2018  . COPD (chronic obstructive pulmonary disease) ([employer]) 02/16/2018  . Bipolar disorder ([place]) 02/16/2018  . HTN (hypertension) 02/16/2018  . H/O adenomatous polyp of colon   . Chronic hepatitis C without hepatic coma ([place]) 11/12/2017  . Abnormal LFTs 06/15/2017  . Constipation 06/15/2017  . Anemia 03/24/2017  . Bilateral lower extremity edema 01/01/2016  . Encounter for screening colonoscopy 01/01/2016  . Macrocytic anemia 01/01/2016  . Major depressive disorder, single episode 03/26/2015    Orientation RESPIRATION BLADDER Height & Weight     Self, Time, Situation, Place  Normal Continent Weight: 70.8 kg Height:  5\' 5"  (165.1 cm)  BEHAVIORAL SYMPTOMS/MOOD NEUROLOGICAL BOWEL NUTRITION STATUS  (none) (none) Continent (none)  AMBULATORY STATUS COMMUNICATION OF NEEDS Skin   Independent Verbally Normal                       Personal Care Assistance Level of Assistance  (none)           Functional Limitations Info  (none)          SPECIAL CARE FACTORS FREQUENCY                       Contractures Contractures Info: Not present     Additional Factors Info  Code Status Code Status Info: full code             Current Medications (04/11/2018):  This is the current hospital active medication list Current Facility-Administered Medications  Medication Dose Route Frequency Provider Last Rate Last Dose  . albuterol (PROVENTIL HFA;VENTOLIN HFA) 108 (90 Base) MCG/ACT inhaler 2 puff  2 puff Inhalation QID PRN Rankin, Shuvon B, NP      . aspirin EC tablet 325 mg  325 mg Oral BID Rankin, Shuvon B, NP   325 mg at 04/11/18 0839  . busPIRone (BUSPAR) tablet 15 mg  15 mg Oral TID Johnn Hai, MD   15 mg at 04/11/18 4627  . divalproex (DEPAKOTE) DR tablet 500 mg  500 mg Oral BID Johnn Hai, MD   500 mg at 04/11/18 0350  . docusate sodium (COLACE) capsule 100 mg  100 mg Oral Daily Rankin, Shuvon B, NP   100 mg at 04/11/18 0938  . hydrOXYzine (ATARAX/VISTARIL) tablet 25 mg  25 mg Oral Q6H PRN Rankin, Shuvon B, NP   25 mg at 04/10/18 0048  . linaclotide (LINZESS) capsule 290 mcg  290 mcg Oral QAC breakfast Johnn Hai, MD   290 mcg at 04/11/18 0630  . lithium carbonate capsule 300 mg  300 mg Oral BID Rankin, Shuvon B, NP   300 mg at 04/11/18 0821  .  metoprolol succinate (TOPROL-XL) 24 hr tablet 50 mg  50 mg Oral Daily Sharma Covert, MD   50 mg at 04/11/18 3662  . naproxen (NAPROSYN) tablet 500 mg  500 mg Oral BID PRN Rankin, Shuvon B, NP      . nicotine (NICODERM CQ - dosed in mg/24 hours) patch 21 mg  21 mg Transdermal Daily Johnn Hai, MD   21 mg at 04/07/18 0836  . OLANZapine (ZYPREXA) tablet 2.5 mg  2.5 mg Oral QHS Rankin, Shuvon B, NP   2.5 mg at 04/10/18 2130  . oxybutynin (DITROPAN-XL) 24 hr tablet 10 mg  10 mg Oral Daily Rankin, Shuvon B, NP   10 mg at 04/11/18 9476  . pantoprazole (PROTONIX) EC tablet 40 mg  40 mg Oral Daily Rankin, Shuvon B, NP   40 mg at 04/11/18 5465  . PARoxetine (PAXIL) tablet 30 mg  30 mg Oral Daily Rankin, Shuvon B, NP   30 mg at 04/11/18 0354  . traZODone (DESYREL) tablet 25 mg  25 mg Oral  QHS PRN Lindon Romp A, NP      . traZODone (DESYREL) tablet 50 mg  50 mg Oral QHS Sharma Covert, MD   50 mg at 04/10/18 2130  . umeclidinium bromide (INCRUSE ELLIPTA) 62.5 MCG/INH 1 puff  1 puff Inhalation Daily Lenis Noon, [facility]   1 puff at 04/11/18 6568     Discharge Medications: Please see discharge summary for a list of discharge medications.  Relevant Imaging Results:  Relevant Lab Results:   Additional Information    Joanne Chars, LCSW

## 2018-04-26 NOTE — Telephone Encounter (Signed)
Received a call from [facility]. Deliver is scheduled for tomorrow 04/26/2018.

## 2018-04-27 ENCOUNTER — Telehealth: Payer: Self-pay

## 2018-04-27 NOTE — Telephone Encounter (Signed)
Epclusa Hep C medication arrived this afternoon from [facility]. Please advise.

## 2018-05-03 NOTE — Telephone Encounter (Signed)
Patient has been in hospital. Please get CURRENT medication list from the group home so we can initiate HCV treatment.

## 2018-05-03 NOTE — Telephone Encounter (Signed)
See other telephone note. Patient has been admitted to hospital recently. We need to get CURRENT med list from group home.

## 2018-05-04 NOTE — Telephone Encounter (Signed)
Spoke with [employer]. She will fax an updated med list for pt. Will place in LSL box when received.

## 2018-05-04 NOTE — Telephone Encounter (Signed)
Noted. See other not for documentation.

## 2018-05-06 DIAGNOSIS — H2513 Age-related nuclear cataract, bilateral: Secondary | ICD-10-CM | POA: Diagnosis not present

## 2018-05-06 DIAGNOSIS — H353131 Nonexudative age-related macular degeneration, bilateral, early dry stage: Secondary | ICD-10-CM | POA: Diagnosis not present

## 2018-05-06 DIAGNOSIS — H43813 Vitreous degeneration, bilateral: Secondary | ICD-10-CM | POA: Diagnosis not present

## 2018-05-09 NOTE — Telephone Encounter (Signed)
Left a detailed message for Karen Keller to call me back. Waiting on a return call.

## 2018-05-09 NOTE — Telephone Encounter (Signed)
Received copy of med list from [facility] dated 03/09/2018 to 04/08/2018.   Patient hospitalized and D/C on 04/11/2018 and med list is significantly different her D/C summary.    Can we get a copy of CURRENT MAR? The one sent is over a month old.

## 2018-05-09 NOTE — Telephone Encounter (Signed)
[place] and asked her to fax pt's recent med list. She said she would fax it over. Waiting on fax.

## 2018-05-09 NOTE — Telephone Encounter (Signed)
Still have not received updated med list. Let's make sure we get one.  Let's use urgent ov and get patient in office to provide instructions for HCV meds. PLEASE PUT ON MY SCHEDULE.

## 2018-05-09 NOTE — Telephone Encounter (Signed)
SCHEDULED AND NOTIFIED Karen Keller OF DATE AND TIME OF APPOINTMENT

## 2018-05-09 NOTE — Telephone Encounter (Signed)
Med list received and was given to LSL.

## 2018-05-10 NOTE — Telephone Encounter (Signed)
Noted. Spoke to [facility]. She is going to get a copy of the March list and fax it over today.

## 2018-05-16 DIAGNOSIS — B351 Tinea unguium: Secondary | ICD-10-CM | POA: Diagnosis not present

## 2018-05-16 DIAGNOSIS — M79675 Pain in left toe(s): Secondary | ICD-10-CM | POA: Diagnosis not present

## 2018-05-16 DIAGNOSIS — I739 Peripheral vascular disease, unspecified: Secondary | ICD-10-CM | POA: Diagnosis not present

## 2018-05-16 DIAGNOSIS — L84 Corns and callosities: Secondary | ICD-10-CM | POA: Diagnosis not present

## 2018-05-16 DIAGNOSIS — M2041 Other hammer toe(s) (acquired), right foot: Secondary | ICD-10-CM | POA: Diagnosis not present

## 2018-05-16 NOTE — Telephone Encounter (Signed)
Noted  

## 2018-05-16 NOTE — Telephone Encounter (Signed)
I spoke with Karen Keller and she assured me she will get it to [place] tomorrow

## 2018-05-16 NOTE — Telephone Encounter (Addendum)
Have not received March med list.   IT IS IMPERATIVE THAT THEY BRING HER CURRENT/MARCH MED LIST WHEN SHE COMES TO [facility] ON 05/26/18. WE CANNOT START HER HEP C MEDICATION WITHOUT IT.  [place], [place]. We are having a hard time getting information from the group home. Delaying her HCV treatment. We may have coverage issues for subsequent months and we don't get her started. We had first shipment delivered 04/27/18 but cannot start without med list.

## 2018-05-17 ENCOUNTER — Telehealth: Payer: Self-pay | Admitting: Internal Medicine

## 2018-05-17 NOTE — Telephone Encounter (Signed)
noted 

## 2018-05-17 NOTE — Telephone Encounter (Signed)
Medication list faxed and put in your office.

## 2018-05-26 ENCOUNTER — Ambulatory Visit: Payer: Medicare Other | Admitting: Gastroenterology

## 2018-05-26 DIAGNOSIS — F419 Anxiety disorder, unspecified: Secondary | ICD-10-CM | POA: Diagnosis not present

## 2018-05-26 DIAGNOSIS — F250 Schizoaffective disorder, bipolar type: Secondary | ICD-10-CM | POA: Diagnosis not present

## 2018-05-27 ENCOUNTER — Telehealth: Payer: Self-pay | Admitting: Gastroenterology

## 2018-05-27 NOTE — Telephone Encounter (Signed)
Due to the delay in seeing this patient in setting of Covid-19 pandemic, let's have her start Epclusa with instructions. I have reviewed her current med list and updated in Epic.   1. VERBALLY GIVE ORDERS TO HER CAREGIVER ALONG WITH WRITTEN HAND OUT.  2. THEY WILL NEED MEDICATION ORDER FOR EPCLUSA TO TAKE ONE TABLET DAILY WITH OR WITHOUT FOOD STARTING 06/03/18 AND ENDING 08/26/18. 3. THEY WILL NEED ORDER TO STOP OMEPRAZOLE. STOP RANITIDINE. 4. THEY WILL NEED ORDER FOR PEPCID 40MG  BID AS NEEDED FOR REFLUX BUT HAS TO BE ADMINISTERED AT SAME TIME WITH EPCLUSA OR 12 HOURS APART FROM EPCLUSA (STRICT ORDERS). 5. Keep May ov for follow up.          Epclusa for Hepatitis C Instructions:   1. Take Epclusa, one tablet daily with or without food. You should take it at approximately the same time every day.  Do not miss a dose. You will start on March 27th and end on June 19th (12 weeks of treatment).   2. Do not run out of Epclusa! If you are down to one week of medication left and have not heard about your next shipment, please let us know as soon as possible.   3. If you need to start a new medication, prescription from your doctor or over the counter medication, you need to contact us to make sure it does not interfere with Epclusa. There are several medications that can interfere with Epclusa and can make you sick or make the medication not work.  4. If you need to take a medication for acid reflux, your choices are as follows: 1. Pepcid (famotidine) 40mg  up to twice per day administered with Epclusa or 12 hours apart from [place].    5. Tylenol (acetominophen) and Advil (ibuprofen) are safe to take with Epclusa if needed for headache, fever, pain.   6. You will have and appointment and blood work done after 4 weeks of treatment to make sure you are tolerating Epclusa and to see if the medication is getting rid of the Hepatitis C.   7. DO NOT stop Epclusa unless instructed to by your  provider.  8. You will also have blood work done at the end of treatment and 12-24 weeks after end of treatment.       Caregiver signature: _________________  Date: _______________ Patient signature: ____________________  Date: _______________

## 2018-05-27 NOTE — Addendum Note (Signed)
Addended by: Mahala Menghini on: 05/27/2018 12:27 PM   Modules accepted: Orders

## 2018-05-30 NOTE — Telephone Encounter (Signed)
Lmom, waiting on a return call.  

## 2018-05-31 ENCOUNTER — Other Ambulatory Visit: Payer: Self-pay | Admitting: Gastroenterology

## 2018-05-31 NOTE — Telephone Encounter (Signed)
Tried calling [employer]. I spoke to three different people and all three workers at the home asked me to call a different number. (1st # [phone], 2nd# [phone],  3rd# [phone], 4th numbers [phone] or [phone]. [name] wasn't available. Will call back.    [facility] wanted to arrange delivery of medication. Pt hasn't picked up her first Epclusa pills mailed.

## 2018-06-03 NOTE — Telephone Encounter (Signed)
Medication was picked up by Carin Primrose. He understood all instructions and signed papers needed. [facility] was given orders and called to confirm that Epclusa was given by [facility]. Will schedule delivery for next shipment the week of 05/27/18.

## 2018-06-07 NOTE — Telephone Encounter (Signed)
PA was needed for Famotidine. PA was submitted through pts insurance and approved. [facility] is aware of approval.

## 2018-06-13 NOTE — Telephone Encounter (Signed)
noted 

## 2018-06-30 ENCOUNTER — Telehealth: Payer: Self-pay | Admitting: Internal Medicine

## 2018-06-30 NOTE — Telephone Encounter (Signed)
[name] called to say that patient was out of her medication. She doesn't know the name of it. She said it was a new drug and she thinks it starts with an E. Pt gets the medication via mail order but has to pick it up here at the office. Then Mrs Huel Cote wanted to know if she could switch the prescription to [facility]. I told her that I would have the nurse call her back [phone]

## 2018-06-30 NOTE — Telephone Encounter (Signed)
Spoke with [employer]. Pt still has another bottle of medication. [name] was advised to call when pt has taken two weeks of the second bottle and we can arrange a shipment.

## 2018-07-04 NOTE — Telephone Encounter (Signed)
Karen Keller from [facility] called to arrange delivery of Epclusa. Delivery is scheduled for this Wednesday 07/06/18.

## 2018-07-07 DIAGNOSIS — J449 Chronic obstructive pulmonary disease, unspecified: Secondary | ICD-10-CM | POA: Diagnosis not present

## 2018-07-07 DIAGNOSIS — I10 Essential (primary) hypertension: Secondary | ICD-10-CM | POA: Diagnosis not present

## 2018-07-07 DIAGNOSIS — F17200 Nicotine dependence, unspecified, uncomplicated: Secondary | ICD-10-CM | POA: Diagnosis not present

## 2018-07-07 DIAGNOSIS — F319 Bipolar disorder, unspecified: Secondary | ICD-10-CM | POA: Diagnosis not present

## 2018-07-14 NOTE — Telephone Encounter (Signed)
Spoke with [name] and let her know [employer] left a note on our door stating that office was closed. I will call [name] back on Monday when Pt's medication arrives.

## 2018-07-14 NOTE — Telephone Encounter (Signed)
Karen Keller is requesting a call back checking on if shipment was received. She stated she received notification it was shipped.

## 2018-07-14 NOTE — Telephone Encounter (Signed)
Tried calling [employer]. No VM, not able to leave a message. Received a [place] sticker, they didn't call like the note asks on our door due to covid.

## 2018-07-21 ENCOUNTER — Telehealth: Payer: Self-pay | Admitting: Internal Medicine

## 2018-07-21 NOTE — Telephone Encounter (Signed)
Spoke with [employer]. Pt's medication is ready for pickup.

## 2018-07-21 NOTE — Telephone Encounter (Signed)
Karen Keller INQUIRING IF THE PATIENT MEDICATION HAS COME IN

## 2018-07-21 NOTE — Telephone Encounter (Signed)
See other documentation in phone note, pts medication is in.

## 2018-07-28 ENCOUNTER — Ambulatory Visit (INDEPENDENT_AMBULATORY_CARE_PROVIDER_SITE_OTHER): Payer: Medicare Other | Admitting: Gastroenterology

## 2018-07-28 ENCOUNTER — Encounter: Payer: Self-pay | Admitting: Gastroenterology

## 2018-07-28 ENCOUNTER — Other Ambulatory Visit: Payer: Self-pay

## 2018-07-28 DIAGNOSIS — K5900 Constipation, unspecified: Secondary | ICD-10-CM | POA: Diagnosis not present

## 2018-07-28 DIAGNOSIS — B182 Chronic viral hepatitis C: Secondary | ICD-10-CM

## 2018-07-28 DIAGNOSIS — K219 Gastro-esophageal reflux disease without esophagitis: Secondary | ICD-10-CM

## 2018-07-28 MED ORDER — FAMOTIDINE 40 MG PO TABS: ORAL_TABLET | ORAL | 3 refills | Status: DC

## 2018-07-28 NOTE — Patient Instructions (Addendum)
1. Discontinue the prn Pepcid order.  2. Start Pepcid 40mg  at 8am daily WITH EPCLUSA. 3. Complete 12 weeks of Epclusa.  4. Go to [place] for labs so that we can see if Hep C treatment is working.  5. Return to the office in six weeks.

## 2018-07-28 NOTE — Progress Notes (Signed)
Primary Care Physician:  Rosita Fire, MD Primary GI:  Garfield Cornea, MD   Patient Location: Home  Provider Location: [facility] office  Reason for Phone Visit: f/u HCV  Persons present on the phone encounter, with roles: Patient, myself (provider),Angela Nolon Rod, [place] (updated meds and allergies)  Total time (minutes) spent on medical discussion: 12 minutes  Due to COVID-19, visit was conducted using telephonic method (no video was available).  Visit was requested by patient.  Virtual Visit via Telephone only  I connected with Ms. Torbert on 07/28/18 at  1:30 PM EDT by telephone and verified that I am speaking with the correct person using two identifiers.   I discussed the limitations, risks, security and privacy concerns of performing an evaluation and management service by telephone and the availability of in person appointments. I also discussed with the patient that there may be a patient responsible charge related to this service. The patient expressed understanding and agreed to proceed.   HPI:   Patient is a pleasant Ms. Santerre who presents for telephone visit regarding chronic Hep C. She started Epclusa around 06/03/2018, this is the date the medication was picked up. Patient is concerned she may not be taking it but history may not be completely reliable. According to [facility] received, she was still on it as of 07/21/2018 and should have about four weeks left to complete 12 weeks worth. We verified with [name], patient's medication was picked up on May 14th.   Patient doing well except she is having daily heartburn and epigastric pain since we took her off omeprazole. She has an order for prn Pepcid but likely needs scheduled dosing due to her symptoms and to ensure it is given appropriately with her Epclusa. BMs are regular on Linzess. No melena, brbpr.   Current Outpatient Medications  Medication Sig Dispense Refill  . acetaminophen (TYLENOL) 325 MG tablet Take  975 mg by mouth every 8 (eight) hours.    Marland Kitchen albuterol (PROAIR HFA) 108 (90 Base) MCG/ACT inhaler Inhale 2 puffs into the lungs 4 (four) times daily as needed for wheezing or shortness of breath.    . B Complex-Folic Acid (B COMPLEX-VITAMIN B12 PO) Take 1 tablet by mouth daily.    . calcium-vitamin D (OSCAL WITH D) 500-200 MG-UNIT tablet Take 1 tablet by mouth 2 (two) times daily.    . divalproex (DEPAKOTE) 500 MG DR tablet Take 1 tablet (500 mg total) by mouth 2 (two) times daily. For mood stabilization 60 tablet 0  . docusate sodium (COLACE) 100 MG capsule Take 1 capsule (100 mg total) by mouth daily. (May buy from over the counter): For constipation 10 capsule 0  . famotidine (PEPCID) 40 MG tablet Take 40 mg by mouth 2 (two) times daily as needed.     . hydrALAZINE (APRESOLINE) 25 MG tablet Take 25 mg by mouth 4 (four) times daily.    . hydrOXYzine (ATARAX/VISTARIL) 25 MG tablet Take 25 mg by mouth every 6 (six) hours as needed.    Marland Kitchen LINZESS 290 MCG CAPS capsule TAKE 1 CAPSULE BY MOUTH EACH MORNING BEFORE BREAKFAST. 30 capsule 11  . lithium carbonate 300 MG capsule Take 1 capsule (300 mg total) by mouth 2 (two) times daily. For mood stabilization 30 capsule 0  . LORazepam (ATIVAN) 0.5 MG tablet Take 0.25 mg by mouth every 12 (twelve) hours as needed for anxiety.    . Multiple Vitamin (MULTIVITAMIN WITH MINERALS) TABS tablet Take 1 tablet by mouth  daily.    . naproxen (NAPROSYN) 500 MG tablet Take 500 mg by mouth 2 (two) times daily as needed.    Marland Kitchen OLANZapine (ZYPREXA) 2.5 MG tablet Take 1 tablet (2.5 mg total) by mouth at bedtime. For mood control 30 tablet 0  . oxybutynin (DITROPAN-XL) 10 MG 24 hr tablet Take 1 tablet (10 mg total) by mouth daily. For bladder spasms    . PARoxetine (PAXIL) 20 MG tablet Take 20 mg by mouth at bedtime.    Marland Kitchen PARoxetine (PAXIL) 30 MG tablet Take 1 tablet (30 mg total) by mouth daily. For depression (Patient taking differently: Take 30 mg by mouth every morning. For  depression) 30 tablet 0  . senna (SENOKOT) 8.6 MG TABS tablet Take 1 tablet by mouth daily as needed for mild constipation.    . Sofosbuvir-Velpatasvir (EPCLUSA) 400-100 MG TABS Take 1 tablet by mouth daily.    Marland Kitchen tiotropium (SPIRIVA) 18 MCG inhalation capsule Place 18 mcg into inhaler and inhale daily.    . traMADol (ULTRAM) 50 MG tablet Take 50 mg by mouth 2 (two) times daily.     No current facility-administered medications for this visit.     ROS:  General: Negative for anorexia, weight loss, fever, chills, fatigue, weakness. Eyes: Negative for vision changes.  ENT: Negative for hoarseness, difficulty swallowing , nasal congestion. CV: Negative for chest pain, angina, palpitations, dyspnea on exertion, peripheral edema.  Respiratory: Negative for dyspnea at rest, dyspnea on exertion, cough, sputum, wheezing.  GI: See history of present illness. GU:  Negative for dysuria, hematuria, urinary incontinence, urinary frequency, nocturnal urination.  MS: Negative for joint pain, low back pain.  Derm: Negative for rash or itching.  Neuro: Negative for weakness, abnormal sensation, seizure, frequent headaches, memory loss, confusion.  Psych: Negative for anxiety, depression, suicidal ideation, hallucinations.  Endo: Negative for unusual weight change.  Heme: Negative for bruising or bleeding. Allergy: Negative for rash or hives.   Observations/Objective: Pleasant, cooperative. NAD. Otherwise exam unavailable.  Assessment and Plan: Pleasant 63 y/o female with history of chronic GERD, constipation, chronic HCV. She has completed nearly 8 weeks of Epclusa according to our records. She has not had four week labs done. We will make arrangement for those today. Due to her abd pain and heartburn, we will add Pepcid 40mg  daily at 8am with epclusa.   Follow Up Instructions:    I discussed the assessment and treatment plan with the patient. The patient was provided an opportunity to ask questions  and all were answered. The patient agreed with the plan and demonstrated an understanding of the instructions. AVS mailed to patient's home address.   The patient was advised to call back or seek an in-person evaluation if the symptoms worsen or if the condition fails to improve as anticipated.  I provided 12 minutes of non-face-to-face time during this encounter.   Neil Crouch, PA-C

## 2018-08-02 ENCOUNTER — Encounter: Payer: Self-pay | Admitting: Internal Medicine

## 2018-08-02 NOTE — Progress Notes (Signed)
CC'ED TO PCP 

## 2018-08-16 DIAGNOSIS — B182 Chronic viral hepatitis C: Secondary | ICD-10-CM | POA: Diagnosis not present

## 2018-08-16 DIAGNOSIS — K5900 Constipation, unspecified: Secondary | ICD-10-CM | POA: Diagnosis not present

## 2018-08-16 DIAGNOSIS — K219 Gastro-esophageal reflux disease without esophagitis: Secondary | ICD-10-CM | POA: Diagnosis not present

## 2018-08-16 NOTE — Telephone Encounter (Signed)
Pt's last shipment from [facility] was received today. Karen Keller is aware that shipment is in and will pick medication up tomorrow 08/17/18.

## 2018-08-18 LAB — COMPREHENSIVE METABOLIC PANEL
ALT: 12 IU/L (ref 0–32)
AST: 17 IU/L (ref 0–40)
Albumin/Globulin Ratio: 1.8 (ref 1.2–2.2)
Albumin: 4.1 g/dL (ref 3.8–4.8)
Alkaline Phosphatase: 110 IU/L (ref 39–117)
BUN/Creatinine Ratio: 19 (ref 12–28)
BUN: 19 mg/dL (ref 8–27)
Bilirubin Total: 0.3 mg/dL (ref 0.0–1.2)
CO2: 22 mmol/L (ref 20–29)
Calcium: 10.2 mg/dL (ref 8.7–10.3)
Chloride: 106 mmol/L (ref 96–106)
Creatinine, Ser: 0.98 mg/dL (ref 0.57–1.00)
GFR calc Af Amer: 71 mL/min/{1.73_m2} (ref >59)
GFR calc non Af Amer: 61 mL/min/{1.73_m2} (ref >59)
Globulin, Total: 2.3 g/dL (ref 1.5–4.5)
Glucose: 77 mg/dL (ref 65–99)
Potassium: 5.3 mmol/L — ABNORMAL HIGH (ref 3.5–5.2)
Sodium: 140 mmol/L (ref 134–144)
Total Protein: 6.4 g/dL (ref 6.0–8.5)

## 2018-08-18 LAB — CBC WITH DIFFERENTIAL/PLATELET
Basophils Absolute: 0.1 10*3/uL (ref 0.0–0.2)
Basos: 1 %
EOS (ABSOLUTE): 0.2 10*3/uL (ref 0.0–0.4)
Eos: 3 %
Hematocrit: 36.5 % (ref 34.0–46.6)
Hemoglobin: 12.7 g/dL (ref 11.1–15.9)
Immature Grans (Abs): 0 10*3/uL (ref 0.0–0.1)
Immature Granulocytes: 1 %
Lymphocytes Absolute: 2.4 10*3/uL (ref 0.7–3.1)
Lymphs: 32 %
MCH: 35 pg — ABNORMAL HIGH (ref 26.6–33.0)
MCHC: 34.8 g/dL (ref 31.5–35.7)
MCV: 101 fL — ABNORMAL HIGH (ref 79–97)
Monocytes Absolute: 0.6 10*3/uL (ref 0.1–0.9)
Monocytes: 9 %
Neutrophils Absolute: 4 10*3/uL (ref 1.4–7.0)
Neutrophils: 54 %
Platelets: 222 10*3/uL (ref 150–450)
RBC: 3.63 x10E6/uL — ABNORMAL LOW (ref 3.77–5.28)
RDW: 11.3 % — ABNORMAL LOW (ref 11.7–15.4)
WBC: 7.3 10*3/uL (ref 3.4–10.8)

## 2018-08-18 LAB — HCV RNA QUANT RFLX ULTRA OR GENOTYP: HCV Quant Baseline: NOT DETECTED IU/mL

## 2018-08-20 ENCOUNTER — Encounter (HOSPITAL_COMMUNITY): Payer: Self-pay | Admitting: Emergency Medicine

## 2018-08-20 ENCOUNTER — Emergency Department (HOSPITAL_COMMUNITY): Payer: Medicare Other

## 2018-08-20 ENCOUNTER — Emergency Department (HOSPITAL_COMMUNITY)
Admission: EM | Admit: 2018-08-20 | Discharge: 2018-08-20 | Disposition: A | Discharge status: Home / Self Care | Payer: Medicare Other | Attending: Emergency Medicine | Admitting: Emergency Medicine

## 2018-08-20 ENCOUNTER — Other Ambulatory Visit: Payer: Self-pay

## 2018-08-20 DIAGNOSIS — S0003XA Contusion of scalp, initial encounter: Secondary | ICD-10-CM | POA: Diagnosis not present

## 2018-08-20 DIAGNOSIS — S0990XA Unspecified injury of head, initial encounter: Secondary | ICD-10-CM | POA: Diagnosis not present

## 2018-08-20 DIAGNOSIS — Y92198 Other place in other specified residential institution as the place of occurrence of the external cause: Secondary | ICD-10-CM | POA: Diagnosis not present

## 2018-08-20 DIAGNOSIS — S61411A Laceration without foreign body of right hand, initial encounter: Secondary | ICD-10-CM | POA: Diagnosis not present

## 2018-08-20 DIAGNOSIS — Z23 Encounter for immunization: Secondary | ICD-10-CM | POA: Diagnosis not present

## 2018-08-20 DIAGNOSIS — W01198A Fall on same level from slipping, tripping and stumbling with subsequent striking against other object, initial encounter: Secondary | ICD-10-CM | POA: Insufficient documentation

## 2018-08-20 DIAGNOSIS — I10 Essential (primary) hypertension: Secondary | ICD-10-CM | POA: Diagnosis not present

## 2018-08-20 DIAGNOSIS — F17210 Nicotine dependence, cigarettes, uncomplicated: Secondary | ICD-10-CM | POA: Diagnosis not present

## 2018-08-20 DIAGNOSIS — Y9389 Activity, other specified: Secondary | ICD-10-CM | POA: Diagnosis not present

## 2018-08-20 DIAGNOSIS — Y999 Unspecified external cause status: Secondary | ICD-10-CM | POA: Diagnosis not present

## 2018-08-20 DIAGNOSIS — Z79899 Other long term (current) drug therapy: Secondary | ICD-10-CM | POA: Diagnosis not present

## 2018-08-20 DIAGNOSIS — S61412A Laceration without foreign body of left hand, initial encounter: Secondary | ICD-10-CM | POA: Diagnosis not present

## 2018-08-20 DIAGNOSIS — R52 Pain, unspecified: Secondary | ICD-10-CM | POA: Diagnosis not present

## 2018-08-20 DIAGNOSIS — W19XXXA Unspecified fall, initial encounter: Secondary | ICD-10-CM | POA: Diagnosis not present

## 2018-08-20 DIAGNOSIS — J449 Chronic obstructive pulmonary disease, unspecified: Secondary | ICD-10-CM | POA: Insufficient documentation

## 2018-08-20 DIAGNOSIS — S61419A Laceration without foreign body of unspecified hand, initial encounter: Secondary | ICD-10-CM

## 2018-08-20 DIAGNOSIS — M79641 Pain in right hand: Secondary | ICD-10-CM | POA: Diagnosis not present

## 2018-08-20 DIAGNOSIS — R42 Dizziness and giddiness: Secondary | ICD-10-CM

## 2018-08-20 LAB — COMPREHENSIVE METABOLIC PANEL
ALT: 13 U/L (ref 0–44)
AST: 19 U/L (ref 15–41)
Albumin: 3.6 g/dL (ref 3.5–5.0)
Alkaline Phosphatase: 99 U/L (ref 38–126)
Anion gap: 10 (ref 5–15)
BUN: 18 mg/dL (ref 8–23)
CO2: 24 mmol/L (ref 22–32)
Calcium: 10.2 mg/dL (ref 8.9–10.3)
Chloride: 106 mmol/L (ref 98–111)
Creatinine, Ser: 0.91 mg/dL (ref 0.44–1.00)
GFR calc Af Amer: >60 mL/min (ref >60)
GFR calc non Af Amer: >60 mL/min (ref >60)
Glucose, Bld: 93 mg/dL (ref 70–99)
Potassium: 4.4 mmol/L (ref 3.5–5.1)
Sodium: 140 mmol/L (ref 135–145)
Total Bilirubin: 0.4 mg/dL (ref 0.3–1.2)
Total Protein: 6.8 g/dL (ref 6.5–8.1)

## 2018-08-20 LAB — URINALYSIS, ROUTINE W REFLEX MICROSCOPIC
Bacteria, UA: NONE SEEN
Bilirubin Urine: NEGATIVE
Glucose, UA: NEGATIVE mg/dL
Hgb urine dipstick: NEGATIVE
Ketones, ur: NEGATIVE mg/dL
Nitrite: NEGATIVE
Protein, ur: NEGATIVE mg/dL
Specific Gravity, Urine: 1.012 (ref 1.005–1.030)
pH: 6 (ref 5.0–8.0)

## 2018-08-20 LAB — TROPONIN I: Troponin I: <0.03 ng/mL (ref <0.03)

## 2018-08-20 LAB — CBC
HCT: 37.4 % (ref 36.0–46.0)
Hemoglobin: 11.8 g/dL — ABNORMAL LOW (ref 12.0–15.0)
MCH: 33.8 pg (ref 26.0–34.0)
MCHC: 31.6 g/dL (ref 30.0–36.0)
MCV: 107.2 fL — ABNORMAL HIGH (ref 80.0–100.0)
Platelets: 235 10*3/uL (ref 150–400)
RBC: 3.49 MIL/uL — ABNORMAL LOW (ref 3.87–5.11)
RDW: 13.1 % (ref 11.5–15.5)
WBC: 9.3 10*3/uL (ref 4.0–10.5)
nRBC: 0 % (ref 0.0–0.2)

## 2018-08-20 MED ORDER — TETANUS-DIPHTH-ACELL PERTUSSIS 5-2.5-18.5 LF-MCG/0.5 IM SUSP
0.5000 mL | Freq: Once | INTRAMUSCULAR | Status: AC
  Administered 2018-08-20: 0.5 mL via INTRAMUSCULAR
  Filled 2018-08-20: qty 0.5

## 2018-08-20 NOTE — ED Triage Notes (Signed)
Pt states that she thought that her bp dropped and she got up and fell against the wall hurting her right hand and the top of her head.

## 2018-08-20 NOTE — ED Notes (Signed)
Called group home to get patient
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
## 2018-08-20 NOTE — Discharge Instructions (Addendum)
1. Medications: usual home medications 2. Treatment: rest, drink plenty of fluids,  3. Follow Up: Please followup with your primary doctor in 1-2 days for discussion of your diagnoses and further evaluation after today's visit; if you do not have a primary care doctor use the resource guide provided to find one; Please return to the ER for turn of symptoms, your, chills, persistent vomiting, changes in vision, difficulty speaking or walking, worsening symptoms or other concerns.

## 2018-08-26 ENCOUNTER — Other Ambulatory Visit: Payer: Self-pay

## 2018-08-26 DIAGNOSIS — Z8619 Personal history of other infectious and parasitic diseases: Secondary | ICD-10-CM

## 2018-08-26 DIAGNOSIS — K7460 Unspecified cirrhosis of liver: Secondary | ICD-10-CM

## 2018-09-20 ENCOUNTER — Telehealth: Payer: Self-pay

## 2018-09-20 ENCOUNTER — Encounter: Payer: Self-pay | Admitting: Gastroenterology

## 2018-09-20 ENCOUNTER — Ambulatory Visit (INDEPENDENT_AMBULATORY_CARE_PROVIDER_SITE_OTHER): Payer: Medicare Other | Admitting: Gastroenterology

## 2018-09-20 ENCOUNTER — Other Ambulatory Visit: Payer: Self-pay

## 2018-09-20 DIAGNOSIS — B182 Chronic viral hepatitis C: Secondary | ICD-10-CM | POA: Diagnosis not present

## 2018-09-20 DIAGNOSIS — K219 Gastro-esophageal reflux disease without esophagitis: Secondary | ICD-10-CM

## 2018-09-20 MED ORDER — FAMOTIDINE 40 MG PO TABS: 40.0000 mg | ORAL_TABLET | Freq: Two times a day (BID) | ORAL | 5 refills | Status: DC | PRN

## 2018-09-20 MED ORDER — OMEPRAZOLE 40 MG PO CPDR: 40.0000 mg | DELAYED_RELEASE_CAPSULE | Freq: Every day | ORAL | 11 refills | Status: DC

## 2018-09-20 NOTE — Telephone Encounter (Signed)
Correction, Hep C meds were picked up on 08/17/18. See delivery slip signed by [name] on 08/17/18 under media.

## 2018-09-20 NOTE — Patient Instructions (Addendum)
1. Your last Hep C lab was NEGATIVE. To be considered cured, we will need to recheck your labs once at 3 months after treatment completion. Please go for labs 12/21/2018 or after. Our office will send you lab orders when it is time.  2. We will restart omeprazole 40mg  once daily before breakfast to management your reflux better. New RX sent to [facility].  3. We will change Pepcid to 40 mg twice daily as needed for heartburn, reflux. New RX sent to [facility].  4. D/C standing Pepcid order.  5. Return to the office in four months or call sooner if needed.

## 2018-09-20 NOTE — Telephone Encounter (Signed)
Karen Keller is returning AS call. Please call [name] on her cell phone. Last pickup date for Hep C medication was 08/17/18.

## 2018-09-20 NOTE — Telephone Encounter (Signed)
Called Beverly back and she is faxing over [facility] late this evening for LSL to review.  LSL made aware.

## 2018-09-20 NOTE — Progress Notes (Signed)
Primary Care Physician:  Rosita Fire, MD Primary GI:  Garfield Cornea, MD   Patient Location: Home  Provider Location: Provider home  Reason for Phone Visit: HCV  Persons present on the phone encounter, with roles: Patient, Karen Keller (patient caregiver) myself (provider),Karen Keller CMA (updated meds and allergies)  Total time (minutes) spent on medical discussion: 10 minutes  Due to COVID-19, visit was conducted using telephonic method (no video was available).  Visit was requested by patient.  Virtual Visit via Telephone only  I connected with Karen Keller on 09/20/18 at 10:30 AM EDT by telephone and verified that I am speaking with the correct person using two identifiers.   I discussed the limitations, risks, security and privacy concerns of performing an evaluation and management service by telephone and the availability of in person appointments. I also discussed with the patient that there may be a patient responsible charge related to this service. The patient expressed understanding and agreed to proceed.  Chief Complaint  Patient presents with  . Hepatitis C    Follow-up,No current issues     HPI:   Patient is a pleasant 63 y/o female who presents for telephone visit regarding chronic HCV. She started [place] sometime around 06/03/18. She recently completed Epclusa, 12 weeks of treatment. HCV RNA negative 08/16/18, LFTs normal.    Completed telephone visit with patient along with caregive, Karen Keller. [name] verified that patient completed Epclusa but not clear of the date, per staff was this month. We have documentation that Epclusa was picked up by caregiver 06/03/18, 07/21/18, 08/17/18. They are sending over [facility] for verification.   Patient doing well. No abdominal pain. No n/v. Still having poorly controlled reflux on Pepcid daily. No dysphagia. No melena, brbpr.   Current Outpatient Medications  Medication Sig Dispense Refill  . acetaminophen  (TYLENOL) 325 MG tablet Take 325 mg by mouth 3 (three) times daily.     Marland Kitchen albuterol (PROAIR HFA) 108 (90 Base) MCG/ACT inhaler Inhale 2 puffs into the lungs 4 (four) times daily as needed for wheezing or shortness of breath. (Patient taking differently: Inhale 2 puffs into the lungs as needed for wheezing or shortness of breath. )    . B Complex-Folic Acid (B COMPLEX-VITAMIN B12 PO) Take 1 tablet by mouth daily.    . calcium-vitamin D (OSCAL WITH D) 500-200 MG-UNIT tablet Take 1 tablet by mouth 2 (two) times daily.    . divalproex (DEPAKOTE) 500 MG DR tablet Take 1 tablet (500 mg total) by mouth 2 (two) times daily. For mood stabilization 60 tablet 0  . docusate sodium (COLACE) 100 MG capsule Take 1 capsule (100 mg total) by mouth daily. (May buy from over the counter): For constipation 10 capsule 0  . famotidine (PEPCID) 40 MG tablet 40mg  daily at [place]. Discontinue the Pepcid 40mg  bid prn order. 30 tablet 3  . hydrALAZINE (APRESOLINE) 25 MG tablet Take 25 mg by mouth 4 (four) times daily.    . hydrOXYzine (ATARAX/VISTARIL) 25 MG tablet Take 25 mg by mouth every 6 (six) hours as needed.    Marland Kitchen LINZESS 290 MCG CAPS capsule TAKE 1 CAPSULE BY MOUTH EACH MORNING BEFORE BREAKFAST. 30 capsule 11  . lithium carbonate 300 MG capsule Take 1 capsule (300 mg total) by mouth 2 (two) times daily. For mood stabilization 30 capsule 0  . LORazepam (ATIVAN) 0.5 MG tablet Take 0.25 mg by mouth as needed for anxiety.     Marland Kitchen  Multiple Vitamin (MULTIVITAMIN WITH MINERALS) TABS tablet Take 1 tablet by mouth daily.    . naproxen (NAPROSYN) 500 MG tablet Take 500 mg by mouth 2 (two) times daily as needed.    Marland Kitchen OLANZapine (ZYPREXA) 2.5 MG tablet Take 1 tablet (2.5 mg total) by mouth at bedtime. For mood control 30 tablet 0  . oxybutynin (DITROPAN-XL) 10 MG 24 hr tablet Take 1 tablet (10 mg total) by mouth daily. For bladder spasms    . PARoxetine (PAXIL) 20 MG tablet Take 20 mg by mouth at bedtime.    Marland Kitchen PARoxetine  (PAXIL) 30 MG tablet Take 1 tablet (30 mg total) by mouth daily. For depression (Patient taking differently: Take 30 mg by mouth every morning. For depression) 30 tablet 0  . senna (SENOKOT) 8.6 MG TABS tablet Take 1 tablet by mouth daily as needed for mild constipation.    .       . tiotropium (SPIRIVA) 18 MCG inhalation capsule Place 18 mcg into inhaler and inhale daily.    . traMADol (ULTRAM) 50 MG tablet Take 50 mg by mouth 2 (two) times daily.     No current facility-administered medications for this visit.    ROS:  General: Negative for anorexia, weight loss, fever, chills, fatigue, weakness. Eyes: Negative for vision changes.  ENT: Negative for hoarseness, difficulty swallowing , nasal congestion. CV: Negative for chest pain, angina, palpitations, dyspnea on exertion, peripheral edema.  Respiratory: Negative for dyspnea at rest, dyspnea on exertion, cough, sputum, wheezing.  GI: See history of present illness. GU:  Negative for dysuria, hematuria, urinary incontinence, urinary frequency, nocturnal urination.  MS: Negative for joint pain, low back pain.  Derm: Negative for rash or itching.  Neuro: Negative for weakness, abnormal sensation, seizure, frequent headaches, memory loss, confusion.  Psych: Negative for anxiety, depression, suicidal ideation, hallucinations.  Endo: Negative for unusual weight change.  Heme: Negative for bruising or bleeding. Allergy: Negative for rash or hives.   Observations/Objective: Pleasant, alert and oriented female in NAD. Answered questions appropriately.   Assessment and Plan: Pleasant 63 y/o female with history of chronic HCV presenting for telephone follow up. Recently completed Epclusa, 12 weeks for genotype 3, F1 fibrosis on fibrosure. HCV RNA negative at 8 weeks of treatment, LFTs normalized. Patient doing well except for breakthrough heartburn since we d/cd her omeprazole. Will resume omeprazole 40mg  once daily before breakfast. Changed  scheduled pepcid to pepcid 40mg  bid prn. Will complete 3 month post treatment labs 12/21/2018 or after. Return to the office in four months.    Follow Up Instructions:    I discussed the assessment and treatment plan with the patient. The patient was provided an opportunity to ask questions and all were answered. The patient agreed with the plan and demonstrated an understanding of the instructions. AVS mailed to patient's home address.   The patient was advised to call back or seek an in-person evaluation if the symptoms worsen or if the condition fails to improve as anticipated.  I provided 10 minutes of non-face-to-face time during this encounter.   Neil Crouch, PA-C

## 2018-09-21 NOTE — Progress Notes (Signed)
Received MARs for June/July. Patient completed Epclusa 09/19/18. Verfied other medications as well.

## 2018-11-08 DIAGNOSIS — J449 Chronic obstructive pulmonary disease, unspecified: Secondary | ICD-10-CM | POA: Diagnosis not present

## 2018-11-08 DIAGNOSIS — F17210 Nicotine dependence, cigarettes, uncomplicated: Secondary | ICD-10-CM | POA: Diagnosis not present

## 2018-11-08 DIAGNOSIS — Z1331 Encounter for screening for depression: Secondary | ICD-10-CM | POA: Diagnosis not present

## 2018-11-08 DIAGNOSIS — F17200 Nicotine dependence, unspecified, uncomplicated: Secondary | ICD-10-CM | POA: Diagnosis not present

## 2018-11-08 DIAGNOSIS — I10 Essential (primary) hypertension: Secondary | ICD-10-CM | POA: Diagnosis not present

## 2018-11-08 DIAGNOSIS — F319 Bipolar disorder, unspecified: Secondary | ICD-10-CM | POA: Diagnosis not present

## 2018-11-08 DIAGNOSIS — Z23 Encounter for immunization: Secondary | ICD-10-CM | POA: Diagnosis not present

## 2018-11-08 DIAGNOSIS — Z1389 Encounter for screening for other disorder: Secondary | ICD-10-CM | POA: Diagnosis not present

## 2018-11-11 DIAGNOSIS — I10 Essential (primary) hypertension: Secondary | ICD-10-CM | POA: Diagnosis not present

## 2018-11-25 ENCOUNTER — Other Ambulatory Visit (HOSPITAL_COMMUNITY): Payer: Self-pay | Admitting: Internal Medicine

## 2018-11-25 DIAGNOSIS — Z1231 Encounter for screening mammogram for malignant neoplasm of breast: Secondary | ICD-10-CM

## 2018-12-08 DIAGNOSIS — J449 Chronic obstructive pulmonary disease, unspecified: Secondary | ICD-10-CM | POA: Diagnosis not present

## 2018-12-08 DIAGNOSIS — I10 Essential (primary) hypertension: Secondary | ICD-10-CM | POA: Diagnosis not present

## 2018-12-14 ENCOUNTER — Other Ambulatory Visit: Payer: Self-pay

## 2018-12-14 DIAGNOSIS — Z8619 Personal history of other infectious and parasitic diseases: Secondary | ICD-10-CM

## 2018-12-14 DIAGNOSIS — K7460 Unspecified cirrhosis of liver: Secondary | ICD-10-CM

## 2019-01-04 DIAGNOSIS — F419 Anxiety disorder, unspecified: Secondary | ICD-10-CM | POA: Diagnosis not present

## 2019-01-04 DIAGNOSIS — F250 Schizoaffective disorder, bipolar type: Secondary | ICD-10-CM | POA: Diagnosis not present

## 2019-01-10 IMAGING — CT CT HEAD W/O CM
3 series · 16 of 47 positions shown, 19 images · non-contrast
Comparison: Maxillofacial CT dated 06/10/2016.

CLINICAL DATA: Multiple falls this week. Unsteady gait. Some
confusion.

EXAM:
CT HEAD WITHOUT CONTRAST
TECHNIQUE: Contiguous axial images were obtained from the base of the skull
through the vertex without intravenous contrast.

[Series 2: head trauma wo · axial · 0.46mm/px · z∈[+12,+147]mm · 10 of 33 slices shown, 13 images]
[im 3/33  brain]
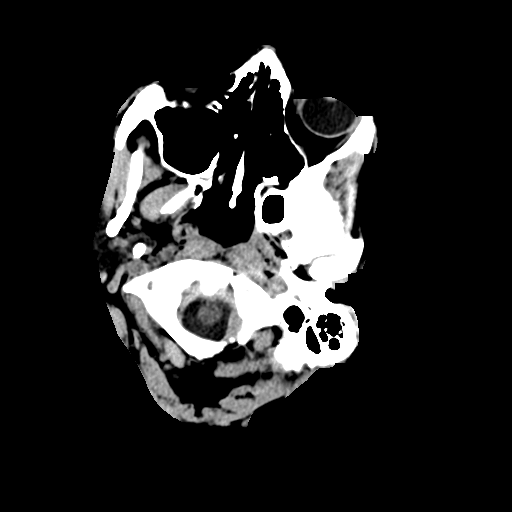
[im 3/33  bone]
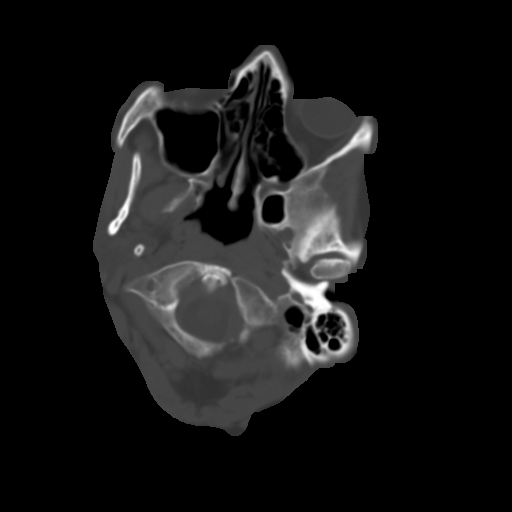
[im 6/33  brain]
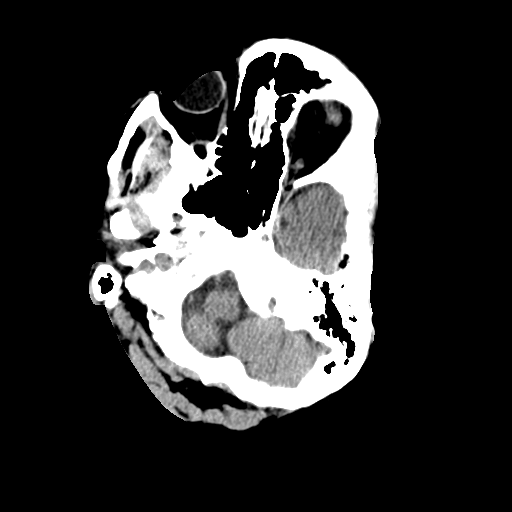
[im 9/33  brain]
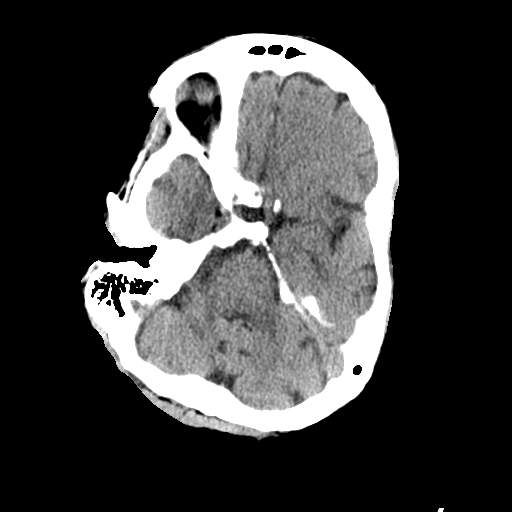
[im 12/33  brain]
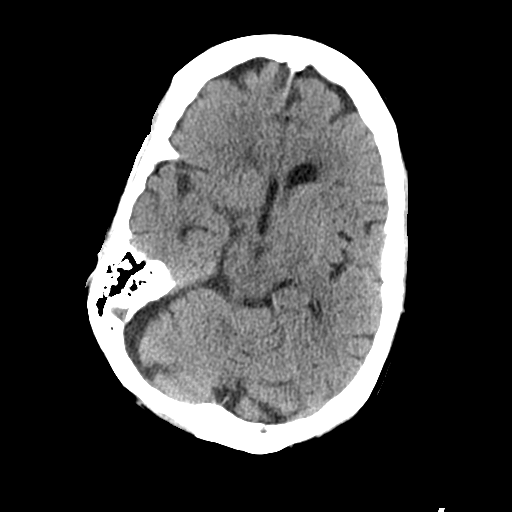
[im 15/33  brain]
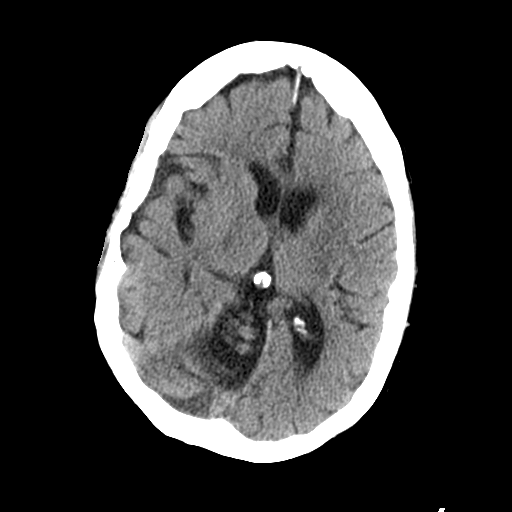
[im 15/33  bone]
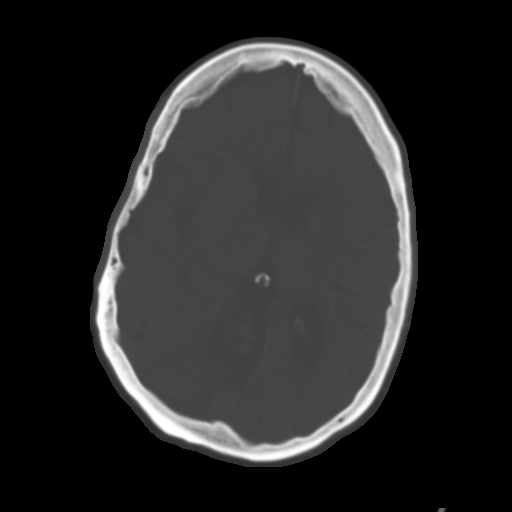
[im 18/33  brain]
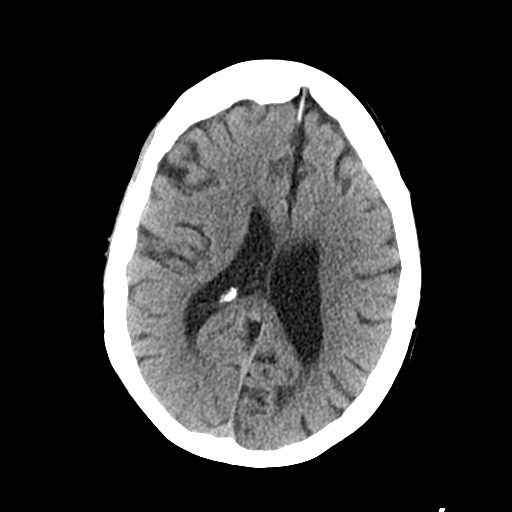
[im 21/33  brain]
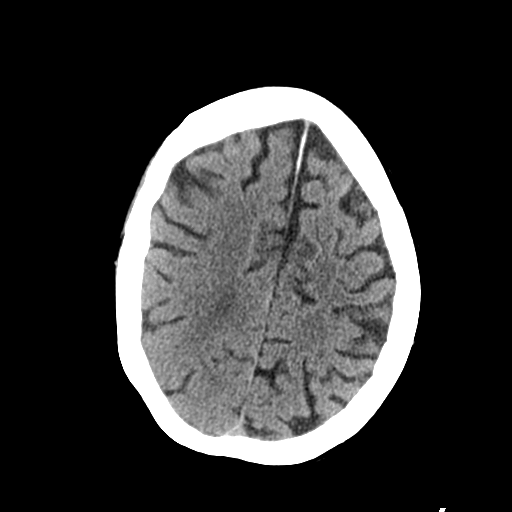
[im 25/33  brain]
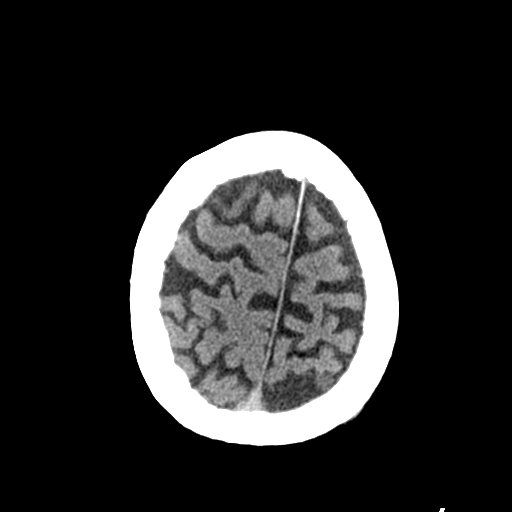
[im 27/33  brain]
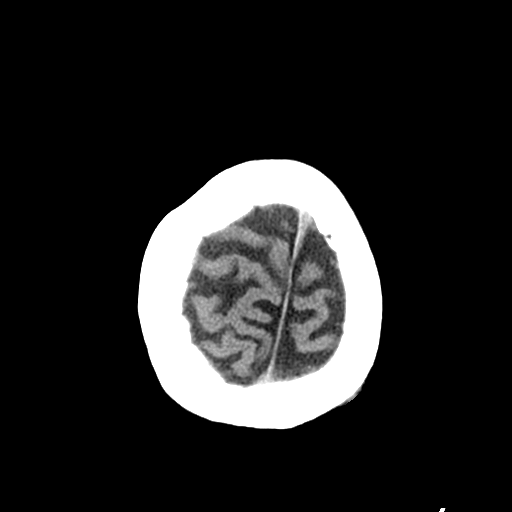
[im 27/33  bone]
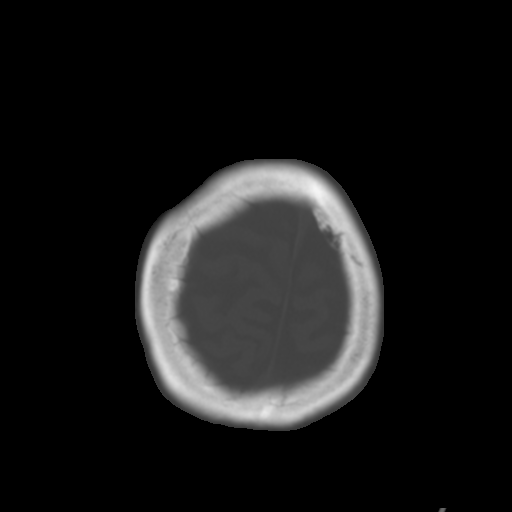
[im 30/33  brain]
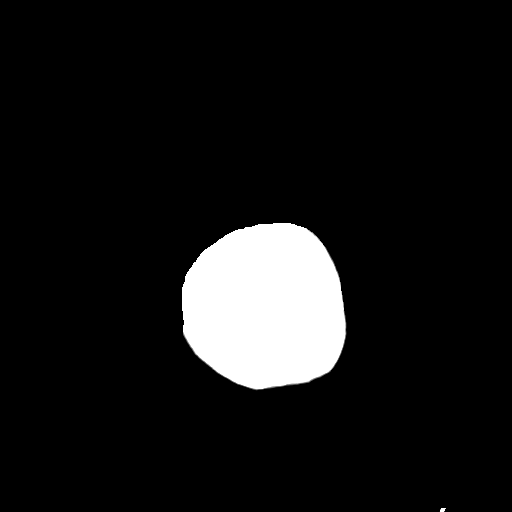

[Series 4: coronal soft tissue · coronal · 0.40mm/px · 3 of 66 slices shown]
[im 22/66  brain]
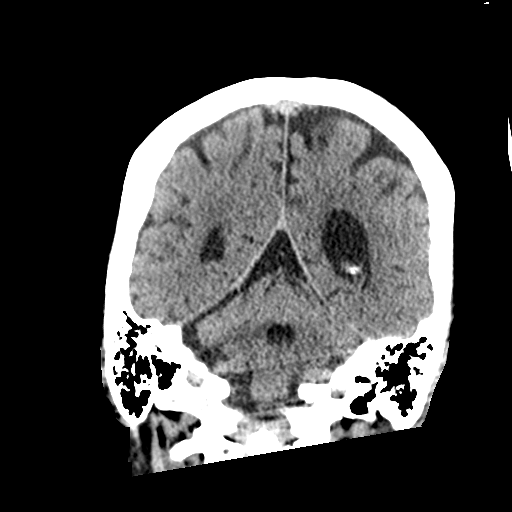
[im 29/66  brain]
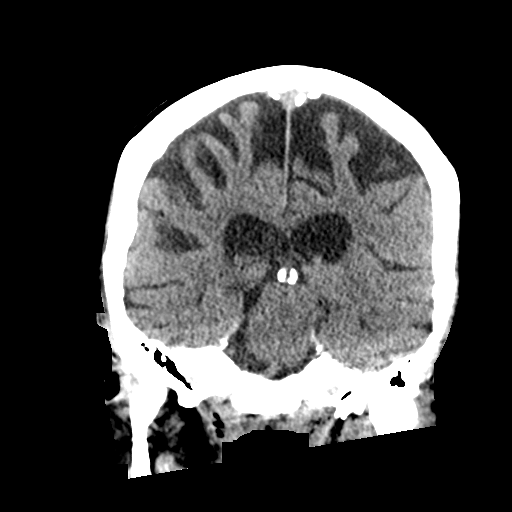
[im 37/66  brain]
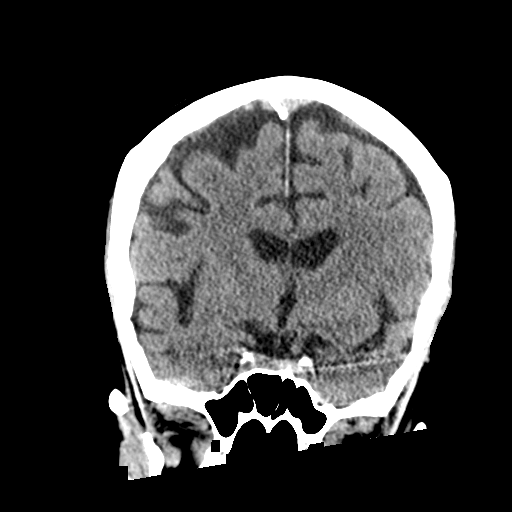

[Series 5: sagittal soft tissue · sagittal · 0.40mm/px · 3 of 53 slices shown]
[im 21/53  brain]
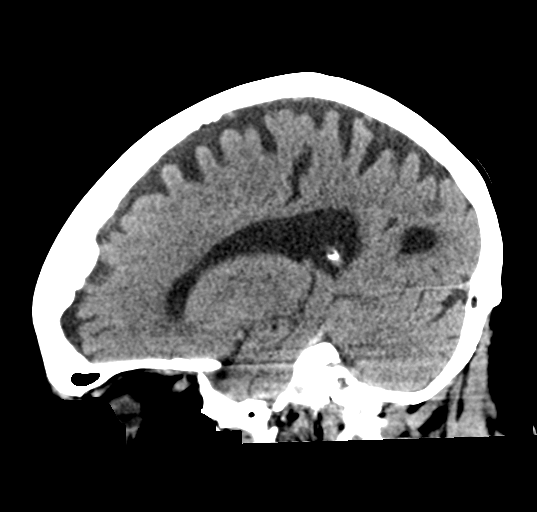
[im 27/53  brain]
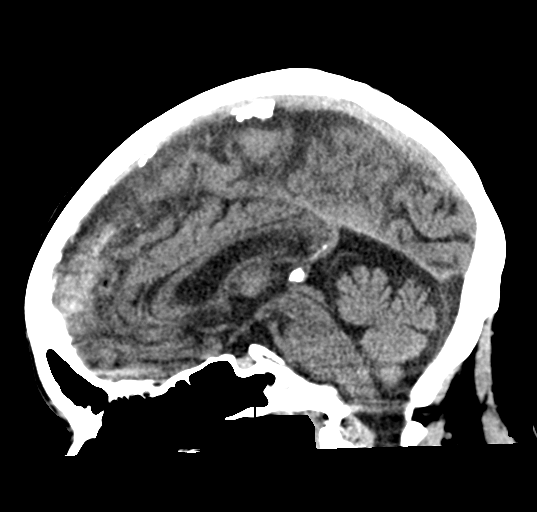
[im 32/53  brain]
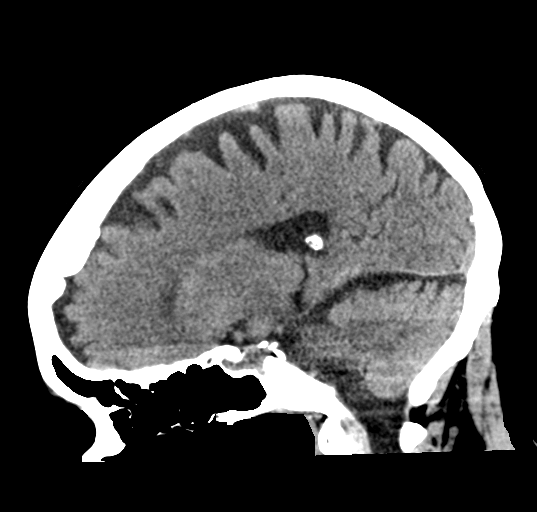

[16 of 47 positions shown; findings below may reference images not displayed]

FINDINGS: Brain: Diffusely enlarged ventricles and subarachnoid spaces. Patchy
white matter low density in both cerebral hemispheres. No
intracranial hemorrhage, mass lesion or CT evidence of acute
infarction.

Vascular: No hyperdense vessel or unexpected calcification.

Skull:  Mild bilateral frontal hyperostosis.  No fracture.

Sinuses/Orbits: Unremarkable.

Other: None.
IMPRESSION: No acute abnormality. Mild to moderate diffuse cerebral and
cerebellar atrophy and mild chronic small vessel white matter
ischemic changes in both cerebral hemispheres.

## 2019-01-10 IMAGING — DX DG HIP (WITH OR WITHOUT PELVIS) 2-3V*R*
3 series · 3 of 3 positions shown · non-contrast
Comparison: None.

CLINICAL DATA: Right-sided fall, recurrent falls, initial
encounter. Right hip and right knee pain.

EXAM:
DG HIP (WITH OR WITHOUT PELVIS) 2-3V RIGHT

[pelvis ap]
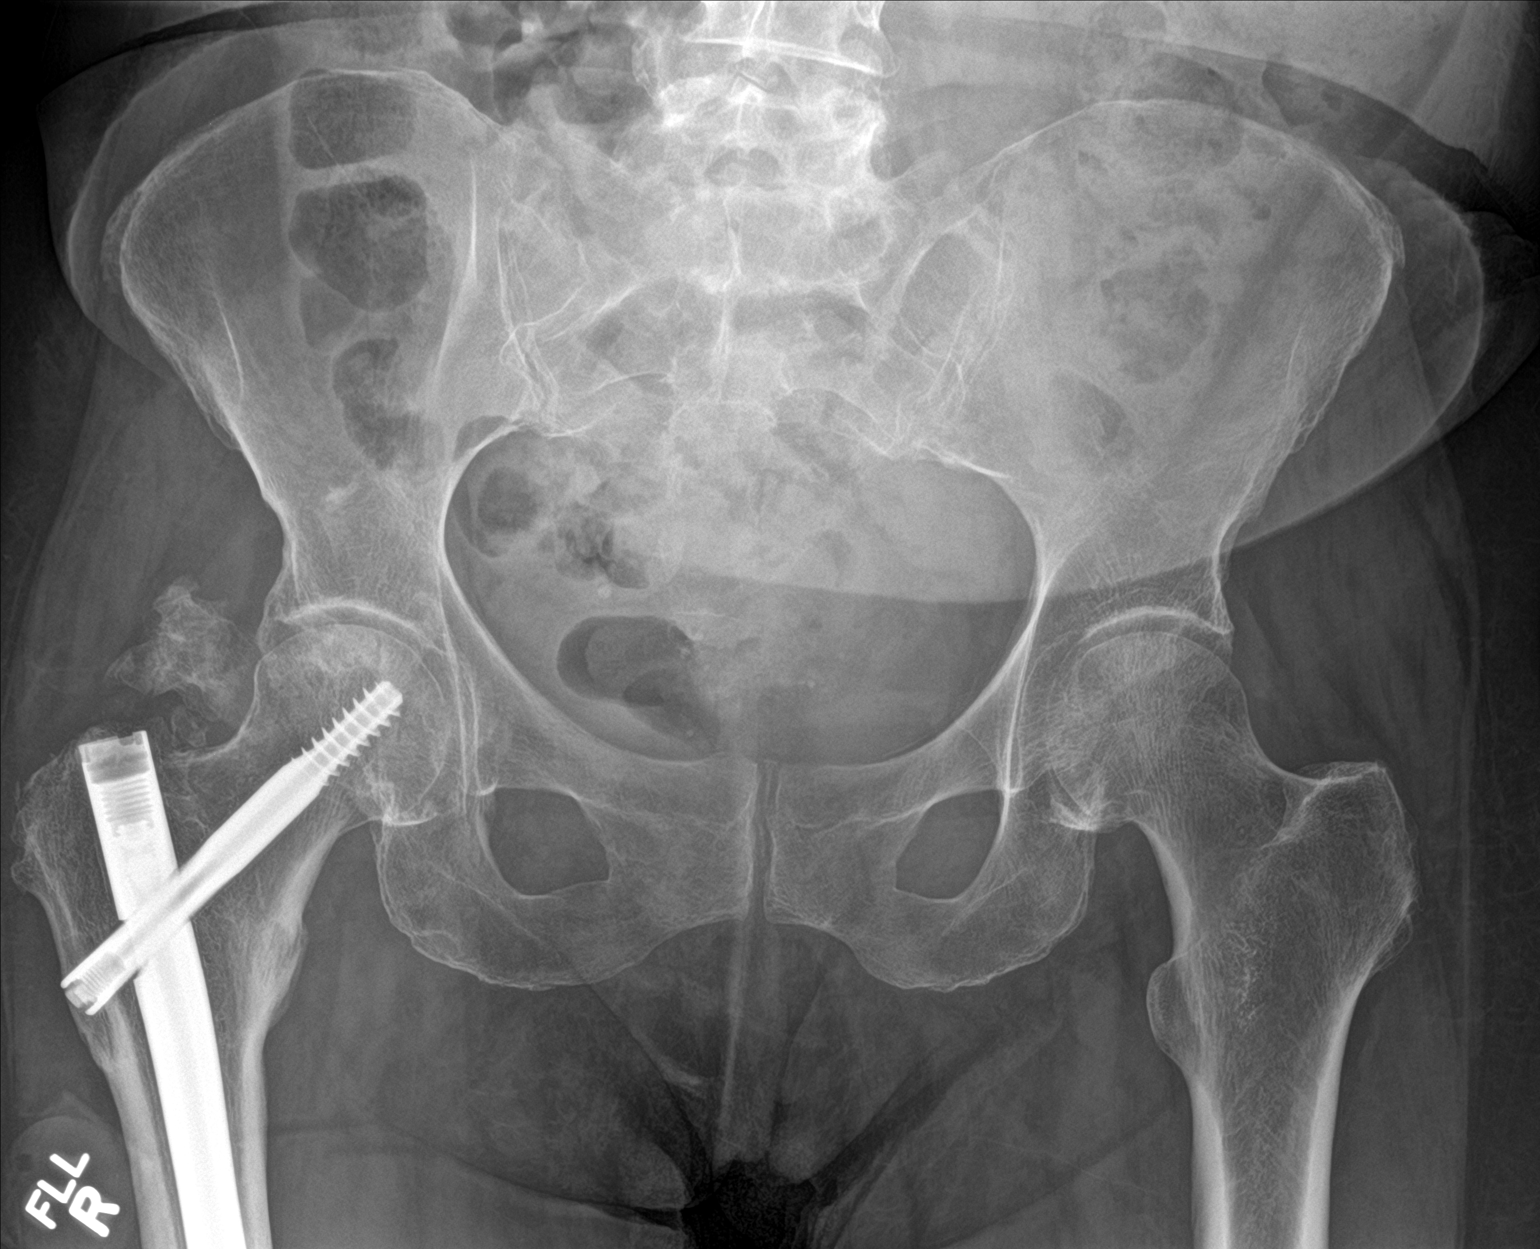

[hip ap]
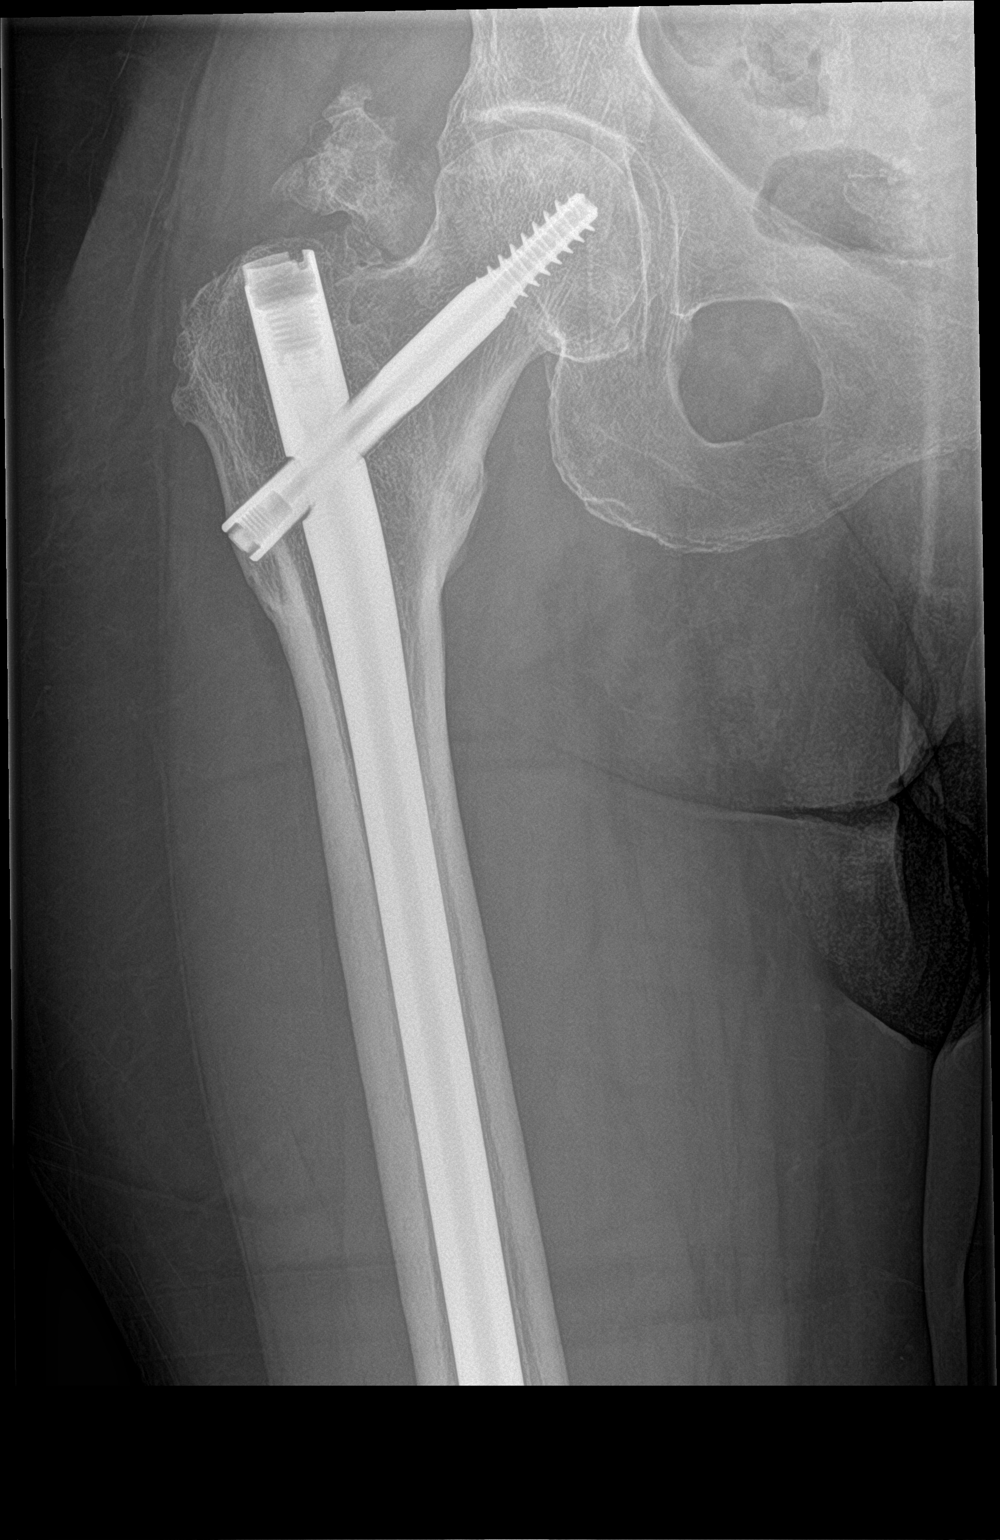

[hip lat]
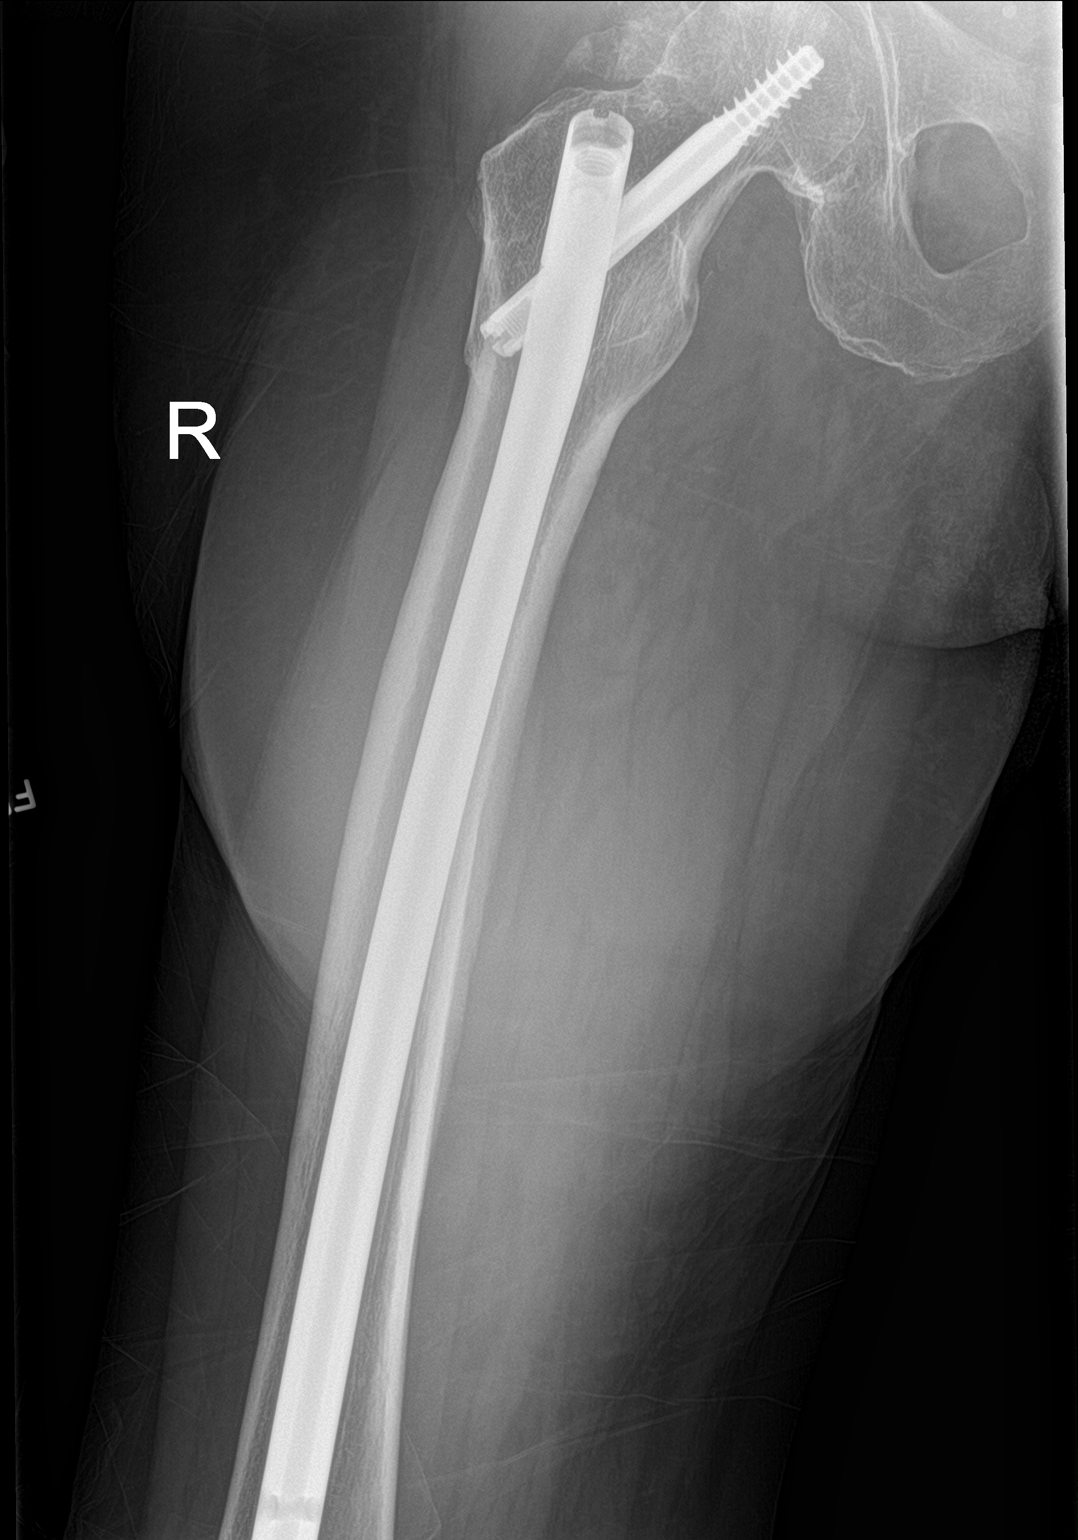

[3 of 3 positions shown; findings below may reference images not displayed]

FINDINGS: No acute osseous abnormality. Question changes of avascular necrosis
in the right femoral head, without cortical collapse. Dynamic hip
screw and intramedullary rod in the proximal right femur. Adjacent
heterotopic ossification along the superior aspect of the right hip
joint. Hip joint space is maintained bilaterally. Degenerative
changes are seen in the lower lumbar spine.
IMPRESSION: 1. No acute osseous abnormality.
2. Suspect changes of avascular necrosis in the right femoral head.

## 2019-01-15 ENCOUNTER — Emergency Department (HOSPITAL_COMMUNITY)
Admission: EM | Admit: 2019-01-15 | Discharge: 2019-01-16 | Disposition: A | Discharge status: Other Acute Inpatient Hospital | Payer: Medicare Other | Attending: Emergency Medicine | Admitting: Emergency Medicine

## 2019-01-15 ENCOUNTER — Other Ambulatory Visit: Payer: Self-pay

## 2019-01-15 DIAGNOSIS — R45851 Suicidal ideations: Secondary | ICD-10-CM | POA: Diagnosis not present

## 2019-01-15 DIAGNOSIS — S51811A Laceration without foreign body of right forearm, initial encounter: Secondary | ICD-10-CM | POA: Diagnosis not present

## 2019-01-15 DIAGNOSIS — N3000 Acute cystitis without hematuria: Secondary | ICD-10-CM | POA: Insufficient documentation

## 2019-01-15 DIAGNOSIS — X788XXA Intentional self-harm by other sharp object, initial encounter: Secondary | ICD-10-CM | POA: Insufficient documentation

## 2019-01-15 DIAGNOSIS — F250 Schizoaffective disorder, bipolar type: Secondary | ICD-10-CM | POA: Insufficient documentation

## 2019-01-15 DIAGNOSIS — I10 Essential (primary) hypertension: Secondary | ICD-10-CM | POA: Insufficient documentation

## 2019-01-15 DIAGNOSIS — Y939 Activity, unspecified: Secondary | ICD-10-CM | POA: Insufficient documentation

## 2019-01-15 DIAGNOSIS — T148XXA Other injury of unspecified body region, initial encounter: Secondary | ICD-10-CM

## 2019-01-15 DIAGNOSIS — Z79899 Other long term (current) drug therapy: Secondary | ICD-10-CM | POA: Insufficient documentation

## 2019-01-15 DIAGNOSIS — J45909 Unspecified asthma, uncomplicated: Secondary | ICD-10-CM | POA: Insufficient documentation

## 2019-01-15 DIAGNOSIS — S61512A Laceration without foreign body of left wrist, initial encounter: Secondary | ICD-10-CM | POA: Insufficient documentation

## 2019-01-15 DIAGNOSIS — Z20828 Contact with and (suspected) exposure to other viral communicable diseases: Secondary | ICD-10-CM | POA: Insufficient documentation

## 2019-01-15 DIAGNOSIS — Y999 Unspecified external cause status: Secondary | ICD-10-CM | POA: Insufficient documentation

## 2019-01-15 DIAGNOSIS — Z03818 Encounter for observation for suspected exposure to other biological agents ruled out: Secondary | ICD-10-CM | POA: Diagnosis not present

## 2019-01-15 DIAGNOSIS — J449 Chronic obstructive pulmonary disease, unspecified: Secondary | ICD-10-CM | POA: Insufficient documentation

## 2019-01-15 DIAGNOSIS — F17210 Nicotine dependence, cigarettes, uncomplicated: Secondary | ICD-10-CM | POA: Insufficient documentation

## 2019-01-15 DIAGNOSIS — S61511A Laceration without foreign body of right wrist, initial encounter: Secondary | ICD-10-CM | POA: Insufficient documentation

## 2019-01-15 DIAGNOSIS — S51812A Laceration without foreign body of left forearm, initial encounter: Secondary | ICD-10-CM | POA: Diagnosis not present

## 2019-01-15 DIAGNOSIS — Y92199 Unspecified place in other specified residential institution as the place of occurrence of the external cause: Secondary | ICD-10-CM | POA: Insufficient documentation

## 2019-01-15 LAB — URINALYSIS, ROUTINE W REFLEX MICROSCOPIC
Bilirubin Urine: NEGATIVE
Glucose, UA: NEGATIVE mg/dL
Hgb urine dipstick: NEGATIVE
Ketones, ur: NEGATIVE mg/dL
Nitrite: NEGATIVE
Protein, ur: NEGATIVE mg/dL
Specific Gravity, Urine: 1.008 (ref 1.005–1.030)
pH: 6 (ref 5.0–8.0)

## 2019-01-15 LAB — CBC WITH DIFFERENTIAL/PLATELET
Abs Immature Granulocytes: 0.05 10*3/uL (ref 0.00–0.07)
Basophils Absolute: 0.1 10*3/uL (ref 0.0–0.1)
Basophils Relative: 1 %
Eosinophils Absolute: 0.2 10*3/uL (ref 0.0–0.5)
Eosinophils Relative: 2 %
HCT: 36.7 % (ref 36.0–46.0)
Hemoglobin: 11.8 g/dL — ABNORMAL LOW (ref 12.0–15.0)
Immature Granulocytes: 1 %
Lymphocytes Relative: 31 %
Lymphs Abs: 2.6 10*3/uL (ref 0.7–4.0)
MCH: 33.8 pg (ref 26.0–34.0)
MCHC: 32.2 g/dL (ref 30.0–36.0)
MCV: 105.2 fL — ABNORMAL HIGH (ref 80.0–100.0)
Monocytes Absolute: 0.9 10*3/uL (ref 0.1–1.0)
Monocytes Relative: 10 %
Neutro Abs: 4.6 10*3/uL (ref 1.7–7.7)
Neutrophils Relative %: 55 %
Platelets: 212 10*3/uL (ref 150–400)
RBC: 3.49 MIL/uL — ABNORMAL LOW (ref 3.87–5.11)
RDW: 14 % (ref 11.5–15.5)
WBC: 8.4 10*3/uL (ref 4.0–10.5)
nRBC: 0 % (ref 0.0–0.2)

## 2019-01-15 LAB — COMPREHENSIVE METABOLIC PANEL WITH GFR
ALT: 14 U/L (ref 0–44)
AST: 19 U/L (ref 15–41)
Albumin: 3.8 g/dL (ref 3.5–5.0)
Alkaline Phosphatase: 99 U/L (ref 38–126)
Anion gap: 7 (ref 5–15)
BUN: 19 mg/dL (ref 8–23)
CO2: 24 mmol/L (ref 22–32)
Calcium: 9.4 mg/dL (ref 8.9–10.3)
Chloride: 107 mmol/L (ref 98–111)
Creatinine, Ser: 0.9 mg/dL (ref 0.44–1.00)
GFR calc Af Amer: >60 mL/min
GFR calc non Af Amer: >60 mL/min
Glucose, Bld: 96 mg/dL (ref 70–99)
Potassium: 3.8 mmol/L (ref 3.5–5.1)
Sodium: 138 mmol/L (ref 135–145)
Total Bilirubin: 0.6 mg/dL (ref 0.3–1.2)
Total Protein: 6.9 g/dL (ref 6.5–8.1)

## 2019-01-15 LAB — RAPID URINE DRUG SCREEN, HOSP PERFORMED
Amphetamines: NOT DETECTED
Barbiturates: NOT DETECTED
Benzodiazepines: NOT DETECTED
Cocaine: NOT DETECTED
Opiates: NOT DETECTED
Tetrahydrocannabinol: NOT DETECTED

## 2019-01-15 LAB — SALICYLATE LEVEL: Salicylate Lvl: <7 mg/dL (ref 2.8–30.0)

## 2019-01-15 LAB — ETHANOL: Alcohol, Ethyl (B): <10 mg/dL (ref <10)

## 2019-01-15 LAB — SARS CORONAVIRUS 2 BY RT PCR (HOSPITAL ORDER, PERFORMED IN ~~LOC~~ HOSPITAL LAB): SARS Coronavirus 2: NEGATIVE

## 2019-01-15 LAB — ACETAMINOPHEN LEVEL: Acetaminophen (Tylenol), Serum: <10 ug/mL — ABNORMAL LOW (ref 10–30)

## 2019-01-15 MED ORDER — CEPHALEXIN 500 MG PO CAPS: 500.0000 mg | ORAL_CAPSULE | Freq: Two times a day (BID) | ORAL | Status: DC

## 2019-01-15 MED ORDER — BACITRACIN ZINC 500 UNIT/GM EX OINT
TOPICAL_OINTMENT | Freq: Once | CUTANEOUS | Status: AC
  Administered 2019-01-15: 17:00:00 via TOPICAL
  Filled 2019-01-15: qty 0.9

## 2019-01-15 MED ORDER — HYDROXYZINE HCL 25 MG PO TABS
25.0000 mg | ORAL_TABLET | Freq: Once | ORAL | Status: AC
  Administered 2019-01-15: 18:00:00 25 mg via ORAL
  Filled 2019-01-15: qty 1

## 2019-01-15 MED ORDER — CEPHALEXIN 500 MG PO CAPS: 500.0000 mg | ORAL_CAPSULE | Freq: Once | ORAL | Status: DC

## 2019-01-15 NOTE — ED Provider Notes (Signed)
Medical screening examination/treatment/procedure(s) were conducted as a shared visit with non-physician practitioner(s) and myself.  I personally evaluated the patient during the encounter.      Patient seen by me along with the physician assistant.  Patient presenting for suicidal way ideation.  Patient states she been feeling that way for the last week.  She has cut her forearms multiple times that they are superficial.  She has no desire to live anymore.  She sees a Teacher, music in [place] for bipolar and schizophrenia.  Saw psychiatry last week and they made some medication changes.  Patient will need psychiatric evaluation once medically cleared.  ED ECG REPORT   Date: 01/15/2019  Rate: 61  Rhythm: normal sinus rhythm  QRS Axis: normal  Intervals: normal  ST/T Wave abnormalities: normal  Conduction Disutrbances:none  Narrative Interpretation:   Old EKG Reviewed: none available  I have personally reviewed the EKG tracing and agree with the computerized printout as noted. Cannot rule out anterior infarct age undetermined.  Also there is some artifact.   Fredia Sorrow, MD 01/15/19 1758

## 2019-01-15 NOTE — ED Triage Notes (Signed)
Pt presents today for SI, states she has been having SI for the last week. She has cut her forearms multiple times, appears superficial. Pt states she has no desire to live anymore. Pt sees psychiatrist in [place] for bi-polar and schizophrenia. She saw psychiatry last week and they made some medication changes. Pt denies any history of SI prior to this.
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
## 2019-01-15 NOTE — ED Notes (Signed)
Pt informed of need for urine sample. Pt reports she will get one after she finishes her snack.
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
## 2019-01-15 NOTE — ED Notes (Signed)
Ford called from [facility] stated that pt has bed at [facility]- bed 2 as long as covid test comes back negative. Report number [phone]

## 2019-01-16 ENCOUNTER — Encounter (HOSPITAL_COMMUNITY): Payer: Self-pay | Admitting: *Deleted

## 2019-01-16 ENCOUNTER — Inpatient Hospital Stay (HOSPITAL_COMMUNITY)
Admission: AD | Admit: 2019-01-16 | Discharge: 2019-01-23 | DRG: 885 | Disposition: A | Discharge status: Home / Self Care | Payer: Medicare Other | Source: Intra-hospital | Attending: Psychiatry | Admitting: Psychiatry

## 2019-01-16 ENCOUNTER — Other Ambulatory Visit: Payer: Self-pay

## 2019-01-16 ENCOUNTER — Inpatient Hospital Stay (HOSPITAL_COMMUNITY): Admission: RE | Admit: 2019-01-16 | Payer: Medicare Other | Source: Ambulatory Visit

## 2019-01-16 DIAGNOSIS — G4700 Insomnia, unspecified: Secondary | ICD-10-CM | POA: Diagnosis not present

## 2019-01-16 DIAGNOSIS — K219 Gastro-esophageal reflux disease without esophagitis: Secondary | ICD-10-CM | POA: Diagnosis not present

## 2019-01-16 DIAGNOSIS — J449 Chronic obstructive pulmonary disease, unspecified: Secondary | ICD-10-CM | POA: Diagnosis present

## 2019-01-16 DIAGNOSIS — R45851 Suicidal ideations: Secondary | ICD-10-CM | POA: Diagnosis not present

## 2019-01-16 DIAGNOSIS — F419 Anxiety disorder, unspecified: Secondary | ICD-10-CM | POA: Diagnosis present

## 2019-01-16 DIAGNOSIS — I10 Essential (primary) hypertension: Secondary | ICD-10-CM | POA: Diagnosis present

## 2019-01-16 DIAGNOSIS — F17210 Nicotine dependence, cigarettes, uncomplicated: Secondary | ICD-10-CM | POA: Diagnosis not present

## 2019-01-16 DIAGNOSIS — F250 Schizoaffective disorder, bipolar type: Principal | ICD-10-CM | POA: Diagnosis present

## 2019-01-16 LAB — LIPID PANEL
Cholesterol: 165 mg/dL (ref 0–200)
HDL: 56 mg/dL (ref >40)
LDL Cholesterol: 88 mg/dL (ref 0–99)
Total CHOL/HDL Ratio: 2.9 RATIO
Triglycerides: 107 mg/dL (ref <150)
VLDL: 21 mg/dL (ref 0–40)

## 2019-01-16 LAB — TSH: TSH: 4.645 u[IU]/mL — ABNORMAL HIGH (ref 0.350–4.500)

## 2019-01-16 LAB — HEMOGLOBIN A1C
Hgb A1c MFr Bld: 5.1 % (ref 4.8–5.6)
Mean Plasma Glucose: 99.67 mg/dL

## 2019-01-16 LAB — VALPROIC ACID LEVEL: Valproic Acid Lvl: 17 ug/mL — ABNORMAL LOW (ref 50.0–100.0)

## 2019-01-16 LAB — LITHIUM LEVEL: Lithium Lvl: 0.33 mmol/L — ABNORMAL LOW (ref 0.60–1.20)

## 2019-01-16 MED ORDER — ALUM & MAG HYDROXIDE-SIMETH 200-200-20 MG/5ML PO SUSP: 30.0000 mL | ORAL | Status: DC | PRN

## 2019-01-16 MED ORDER — OXYBUTYNIN CHLORIDE ER 10 MG PO TB24
10.0000 mg | ORAL_TABLET | Freq: Every day | ORAL | Status: DC
  Administered 2019-01-16 – 2019-01-23 (×8): 10 mg via ORAL
  Filled 2019-01-16 (×10): qty 1

## 2019-01-16 MED ORDER — ACETAMINOPHEN 325 MG PO TABS
650.0000 mg | ORAL_TABLET | Freq: Four times a day (QID) | ORAL | Status: DC | PRN
  Administered 2019-01-16 – 2019-01-23 (×11): 650 mg via ORAL
  Filled 2019-01-16 (×8): qty 2

## 2019-01-16 MED ORDER — HYDROXYZINE HCL 10 MG PO TABS
10.0000 mg | ORAL_TABLET | Freq: Two times a day (BID) | ORAL | Status: DC | PRN
  Administered 2019-01-16: 10 mg via ORAL
  Filled 2019-01-16: qty 1

## 2019-01-16 MED ORDER — LITHIUM CARBONATE 300 MG PO CAPS
300.0000 mg | ORAL_CAPSULE | Freq: Two times a day (BID) | ORAL | Status: DC
  Administered 2019-01-16 – 2019-01-20 (×9): 300 mg via ORAL
  Filled 2019-01-16 (×12): qty 1

## 2019-01-16 MED ORDER — LINACLOTIDE 290 MCG PO CAPS
290.0000 ug | ORAL_CAPSULE | Freq: Every day | ORAL | Status: DC
  Administered 2019-01-16 – 2019-01-23 (×8): 290 ug via ORAL
  Filled 2019-01-16 (×10): qty 1

## 2019-01-16 MED ORDER — OLANZAPINE 5 MG PO TABS
5.0000 mg | ORAL_TABLET | Freq: Every day | ORAL | Status: DC
  Administered 2019-01-16 – 2019-01-17 (×2): 5 mg via ORAL
  Filled 2019-01-16: qty 2
  Filled 2019-01-16 (×4): qty 1

## 2019-01-16 MED ORDER — TIOTROPIUM BROMIDE MONOHYDRATE 18 MCG IN CAPS
18.0000 ug | ORAL_CAPSULE | Freq: Every day | RESPIRATORY_TRACT | Status: DC
  Administered 2019-01-16 – 2019-01-23 (×8): 18 ug via RESPIRATORY_TRACT
  Filled 2019-01-16 (×3): qty 5

## 2019-01-16 MED ORDER — TRAMADOL HCL 50 MG PO TABS
50.0000 mg | ORAL_TABLET | Freq: Two times a day (BID) | ORAL | Status: DC
  Administered 2019-01-16 – 2019-01-17 (×4): 50 mg via ORAL
  Filled 2019-01-16 (×4): qty 1

## 2019-01-16 MED ORDER — ALBUTEROL SULFATE HFA 108 (90 BASE) MCG/ACT IN AERS: 2.0000 | INHALATION_SPRAY | RESPIRATORY_TRACT | Status: DC | PRN

## 2019-01-16 MED ORDER — DIVALPROEX SODIUM 125 MG PO CSDR
500.0000 mg | DELAYED_RELEASE_CAPSULE | Freq: Two times a day (BID) | ORAL | Status: DC
  Administered 2019-01-16 – 2019-01-20 (×9): 500 mg via ORAL
  Filled 2019-01-16 (×12): qty 4

## 2019-01-16 MED ORDER — MAGNESIUM HYDROXIDE 400 MG/5ML PO SUSP: 30.0000 mL | Freq: Every day | ORAL | Status: DC | PRN

## 2019-01-16 MED ORDER — SENNA 8.6 MG PO TABS: 1.0000 | ORAL_TABLET | Freq: Every day | ORAL | Status: DC | PRN

## 2019-01-16 MED ORDER — PANTOPRAZOLE SODIUM 40 MG PO TBEC
40.0000 mg | DELAYED_RELEASE_TABLET | Freq: Every day | ORAL | Status: DC
  Administered 2019-01-16 – 2019-01-23 (×8): 40 mg via ORAL
  Filled 2019-01-16 (×10): qty 1

## 2019-01-16 MED ORDER — DOCUSATE SODIUM 100 MG PO CAPS
100.0000 mg | ORAL_CAPSULE | Freq: Every day | ORAL | Status: DC
  Administered 2019-01-16 – 2019-01-23 (×8): 100 mg via ORAL
  Filled 2019-01-16 (×10): qty 1

## 2019-01-16 MED ORDER — FAMOTIDINE 20 MG PO TABS: 40.0000 mg | ORAL_TABLET | Freq: Two times a day (BID) | ORAL | Status: DC | PRN

## 2019-01-16 MED ORDER — HYDRALAZINE HCL 25 MG PO TABS
25.0000 mg | ORAL_TABLET | Freq: Four times a day (QID) | ORAL | Status: DC
  Administered 2019-01-16 – 2019-01-23 (×30): 25 mg via ORAL
  Filled 2019-01-16 (×39): qty 1

## 2019-01-16 MED ORDER — CALCIUM CARBONATE-VITAMIN D 500-200 MG-UNIT PO TABS
1.0000 | ORAL_TABLET | Freq: Two times a day (BID) | ORAL | Status: DC
  Administered 2019-01-16 – 2019-01-23 (×15): 1 via ORAL
  Filled 2019-01-16 (×19): qty 1

## 2019-01-16 MED ORDER — PAROXETINE HCL 30 MG PO TABS
30.0000 mg | ORAL_TABLET | Freq: Every day | ORAL | Status: DC
  Administered 2019-01-16 – 2019-01-18 (×3): 30 mg via ORAL
  Filled 2019-01-16 (×2): qty 1
  Filled 2019-01-16: qty 3
  Filled 2019-01-16: qty 1
  Filled 2019-01-16: qty 3
  Filled 2019-01-16: qty 1

## 2019-01-16 MED ORDER — LORAZEPAM 0.5 MG PO TABS
0.2500 mg | ORAL_TABLET | Freq: Two times a day (BID) | ORAL | Status: DC | PRN
  Administered 2019-01-16 – 2019-01-21 (×3): 0.25 mg via ORAL
  Filled 2019-01-16 (×4): qty 1

## 2019-01-16 MED ORDER — CEPHALEXIN 500 MG PO CAPS
500.0000 mg | ORAL_CAPSULE | Freq: Two times a day (BID) | ORAL | Status: AC
  Administered 2019-01-16 – 2019-01-20 (×10): 500 mg via ORAL
  Filled 2019-01-16: qty 1
  Filled 2019-01-16: qty 2
  Filled 2019-01-16 (×9): qty 1

## 2019-01-16 MED ORDER — ADULT MULTIVITAMIN W/MINERALS CH
1.0000 | ORAL_TABLET | Freq: Every day | ORAL | Status: DC
  Administered 2019-01-16 – 2019-01-23 (×8): 1 via ORAL
  Filled 2019-01-16 (×10): qty 1

## 2019-01-16 MED ORDER — ACETAMINOPHEN 325 MG PO TABS
975.0000 mg | ORAL_TABLET | Freq: Three times a day (TID) | ORAL | Status: DC
  Administered 2019-01-16: 975 mg via ORAL
  Filled 2019-01-16 (×5): qty 3

## 2019-01-16 NOTE — BHH Counselor (Signed)
CSW met with patient to complete PSA and discuss referrals.   Patient identifies her primary stressor as her living situation. She has lived in her current group home for about a year and is unhappy with her home and the administration. Patient states this unhappiness has resulted in her cutting herself this week.  Patient shares that her outpatient provider advised her that she could be placed in a new group home from [facility].   CSW shared that unfortunately, a new group home placement would not be pursued from [facility], assuming patient can return to her group home. CSW explained this is because of the time it can take to find a new placement, especially with COVID. Patient voiced understanding and CSW offered to provide patient with group home resources.  Stephanie Acre, LCSW-A Clinical Social Worker
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
## 2019-01-16 NOTE — BHH Group Notes (Signed)
LCSW Group Therapy Note 01/16/2019 2:26 PM  Type of Therapy and Topic: Group Therapy: Overcoming Obstacles  Participation Level: Active  Description of Group:  In this group patients will be encouraged to explore what they see as obstacles to their own wellness and recovery. They will be guided to discuss their thoughts, feelings, and behaviors related to these obstacles. The group will process together ways to cope with barriers, with attention given to specific choices patients can make. Each patient will be challenged to identify changes they are motivated to make in order to overcome their obstacles. This group will be process-oriented, with patients participating in exploration of their own experiences as well as giving and receiving support and challenge from other group members.  Therapeutic Goals: 1. Patient will identify personal and current obstacles as they relate to admission. 2. Patient will identify barriers that currently interfere with their wellness or overcoming obstacles.  3. Patient will identify feelings, thought process and behaviors related to these barriers. 4. Patient will identify two changes they are willing to make to overcome these obstacles:   Summary of Patient Progress  Karen Keller was engaged and participated throughout the group session.  Karen Keller reports that her main obstacle is "my hopelessness". She identified her group home and their administration as her barriers. She states that the agency lacks organization and programming, which causes her to be bored. Karen Keller states that her barriers cause her to be hopeless because it is "something I cannot change".    Therapeutic Modalities:  Cognitive Behavioral Therapy Solution Focused Therapy Motivational Interviewing Relapse Prevention Therapy   Theresa Duty Clinical Social Worker
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
## 2019-01-16 NOTE — Progress Notes (Signed)
CSW attempted to reach patient's group home, [facility] at [phone] and [phone]. Neither call was answered. CSW left a HIPAA compliant voicemail on the second number and requested a returned call.  Patient is a group home resident, if she is able to return to her group home, she may require a TB test and completed FL-2. CSW following to coordinate discharge planning.  Stephanie Acre, LCSW-A Clinical Social Worker

## 2019-01-16 NOTE — Progress Notes (Addendum)
CSW spoke with [name], owner and operator of patient's group home. She confirms that this patient is able to return to her group home at discharge.  Prior to returning to the group home, Ms.Rucker will need the following information faxed over: -TB Skin test results -COVID test results -FL 2 with updated medication list  Phone: [phone] Fax: Ethel, [place] Social Worker

## 2019-01-16 NOTE — Plan of Care (Signed)
Discussed anxiety, depression and hopeless with patient.

## 2019-01-16 NOTE — Progress Notes (Signed)
Recreation Therapy Notes  Date:  11.9.20 Time: 0930 Location: 300 Hall Group Room  Group Topic: Stress Management  Goal Area(s) Addresses:  Patient will identify positive stress management techniques. Patient will identify benefits of using stress management post d/c.  Behavioral Response:  Engaged  Intervention:  Stress Management  Activity :  Meditation.  LRT introduced the stress management technique of meditation.  LRT played a meditation that focused on taking on the characteristics of a mountain.  Patients were to listen and follow as meditation was played to engage in activity.  Education:  Stress Management, Discharge Planning.   Education Outcome: Acknowledges Education  Clinical Observations/Feedback: Pt attended and participated in activity.    Victorino Sparrow, LRT/CTRS     Ria Comment, Paz Winsett A 01/16/2019 11:10 AM
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
## 2019-01-16 NOTE — Progress Notes (Signed)
D:  Patient's self inventory sheet, patient sleeps good, sleep medication helpful.  Good appetite, normal energy level, good concentration.  Denied depression, anxiety, hopeless.  Denied withdrawals.  Denied SI.  Denied physical problems.  Denied physical pain.  Goal is discharge.  Does have discharge plans. A:  Medications administered per MD orders.  Emotional support and encouragement given patient. R:  Denied SI and HI, contracts for safety.  Denied A/V hallucinations.  Safety maintained with 15 minute checks.

## 2019-01-16 NOTE — Progress Notes (Signed)
The patient's positive event for the day is that she abstained from cutting herself. Her goal for tomorrow is to think of "hopeful things" versus negative thoughts.
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
## 2019-01-16 NOTE — [facility] Suicide Risk Assessment (Signed)
[place] INPATIENT:  Family/Significant Other Suicide Prevention Education  Suicide Prevention Education:  Patient Refusal for Family/Significant Other Suicide Prevention Education: The patient Karen Keller has refused to provide written consent for family/significant other to be provided Family/Significant Other Suicide Prevention Education during admission and/or prior to discharge.  Physician notified.  Joellen Jersey 01/16/2019, 3:13 PM
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
## 2019-01-16 NOTE — [facility] Suicide Risk Assessment (Signed)
[facility] Admission Suicide Risk Assessment   Nursing information obtained from:  Patient Demographic factors:  Age 65 or older, Caucasian, Unemployed Current Mental Status:  Suicidal ideation indicated by patient, Suicidal ideation indicated by others, Self-harm thoughts, Self-harm behaviors, Suicide plan, Belief that plan would result in death, Intention to act on suicide plan Loss Factors:  Decline in physical health Historical Factors:  Impulsivity Risk Reduction Factors:  Living with another person, especially a relative  Total Time spent with patient: 20 minutes Principal Problem: <principal problem not specified> Diagnosis:  Active Problems:   Schizoaffective disorder, bipolar type ([place])  Subjective Data: Patient is seen and examined.  Patient is a 63 year old female with a past psychiatric history reported to be schizoaffective disorder who presented to the [facility] emergency department on 01/15/2019 with suicidal ideation.  The patient apparently lives in a group home.  She stated that she had been doing well until approximately 7 days ago.  She had been seen in her outpatient clinic 10 days ago, and was reportedly doing well.  She identified no new stressors.  She stated that she became severely depressed acutely, and began to get suicidal.  She promised the owner of the group home that she would not harm her self, but then told her that she was unable to keep that promise.  The patient stated that she had a death wish, and that she did not feel of any use any longer.  She admitted to fatigue, irritability, decreased concentration, poor sleep, feelings of helplessness and hopelessness.  She did admit to visual hallucinations of "the cat".  Her last psychiatric hospitalization in our facility was in January of this year.  Her discharge medications at that time included Depakote, lithium, Zyprexa, Paxil and trazodone.  It appears that the medication orders started reflect those medicine she was  discharged on.  Her lithium level from 11/9 (this a.m.) was 0.33, and her Depakote level was 17.  She was admitted to the hospital for evaluation and stabilization.  Continued Clinical Symptoms:  Alcohol Use Disorder Identification Test Final Score (AUDIT): 0 The "Alcohol Use Disorders Identification Test", Guidelines for Use in Primary Care, Second Edition.  World Pharmacologist [facility]). Score between 0-7:  no or low risk or alcohol related problems. Score between 8-15:  moderate risk of alcohol related problems. Score between 16-19:  high risk of alcohol related problems. Score 20 or above:  warrants further diagnostic evaluation for alcohol dependence and treatment.   CLINICAL FACTORS:   Schizophrenia:   Paranoid or undifferentiated type Medical Diagnoses and Treatments/Surgeries   Musculoskeletal: Strength & Muscle Tone: within normal limits Gait & Station: normal Patient leans: N/A  Psychiatric Specialty Exam: Physical Exam  Nursing note and vitals reviewed. Constitutional: She is oriented to person, place, and time. She appears well-developed and well-nourished.  HENT:  Head: Normocephalic and atraumatic.  Respiratory: Effort normal.  Neurological: She is alert and oriented to person, place, and time.    ROS  Blood pressure (!) 150/78, pulse 67, temperature 98 F (36.7 C), temperature source Oral, resp. rate 16, height 5\' 8"  (1.727 m), weight 78.9 kg, SpO2 95 %.Body mass index is 26.46 kg/m.  General Appearance: Disheveled  Eye Contact:  Fair  Speech:  Normal Rate  Volume:  Normal  Mood:  Anxious, Depressed and Dysphoric  Affect:  Congruent  Thought Process:  Coherent and Descriptions of Associations: Intact  Orientation:  Full (Time, Place, and Person)  Thought Content:  Delusions and Hallucinations: Auditory Visual  Suicidal Thoughts:  Yes.  without intent/plan  Homicidal Thoughts:  No  Memory:  Immediate;   Fair Recent;   Fair Remote;   Fair  Judgement:   Impaired  Insight:  Fair  Psychomotor Activity:  Increased  Concentration:  Concentration: Fair and Attention Span: Fair  Recall:  [employer] of Knowledge:  Fair  Language:  Fair  Akathisia:  Negative  Handed:  Right  AIMS (if indicated):     Assets:  Desire for Improvement Resilience  ADL's:  Intact  Cognition:  WNL  Sleep:  Number of Hours: 3.75      COGNITIVE FEATURES THAT CONTRIBUTE TO RISK:  None    SUICIDE RISK:   Mild:  Suicidal ideation of limited frequency, intensity, duration, and specificity.  There are no identifiable plans, no associated intent, mild dysphoria and related symptoms, good self-control (both objective and subjective assessment), few other risk factors, and identifiable protective factors, including available and accessible social support.  PLAN OF CARE: Patient is seen and examined.  Patient is a 63 year old female with the above-stated past psychiatric history was admitted secondary to worsening depression, suicidal ideation.  She will be admitted to the hospital.  She will be integrated into the milieu.  She will be encouraged to attend groups.  Review of her laboratories reveals some degree of noncompliance at least with her lithium and valproic acid.  She stated she had been taking her medications, but the lithium level is only 0.33, and her Depakote level is only 17.  As stated above those medications have been restarted including her olanzapine.  Her blood pressure is elevated this morning at 150/78, pulse is 67 and she is afebrile.  She is on hydralazine for hypertension, and we will continue that for now.  We will monitor her blood pressure.  She also has a history of COPD, and is placed on as needed albuterol.  She has a history of irritable bowel syndrome and is currently on Linzess.  She also has urinary difficulties, and is on oxybutynin for that.  She also remains on Pepcid for reflux symptomatology.  No changes in her medications today, we will get  collateral information with regard to her compliance.  I certify that inpatient services furnished can reasonably be expected to improve the patient's condition.   Sharma Covert, MD 01/16/2019, 8:10 AM

## 2019-01-17 ENCOUNTER — Other Ambulatory Visit: Payer: Self-pay

## 2019-01-17 MED ORDER — TRAMADOL HCL 50 MG PO TABS
50.0000 mg | ORAL_TABLET | Freq: Two times a day (BID) | ORAL | Status: DC | PRN
  Administered 2019-01-18 – 2019-01-20 (×3): 50 mg via ORAL
  Filled 2019-01-17 (×3): qty 1

## 2019-01-17 MED ORDER — TUBERCULIN PPD 5 UNIT/0.1ML ID SOLN
5.0000 [IU] | Freq: Once | INTRADERMAL | Status: AC
  Administered 2019-01-17: 5 [IU] via INTRADERMAL

## 2019-01-17 NOTE — Plan of Care (Signed)
  Problem: Education: Goal: Knowledge of the prescribed therapeutic regimen will improve Outcome: Progressing   Problem: Activity: Goal: Interest or engagement in leisure activities will improve Outcome: Progressing   Problem: Coping: Goal: Coping ability will improve Outcome: Progressing   Problem: Medication: Goal: Compliance with prescribed medication regimen will improve Outcome: Progressing
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
## 2019-01-17 NOTE — Progress Notes (Signed)
Adult Psychoeducational Group Note  Date:  01/17/2019 Time:  9:21 AM  Group Topic/Focus:  Goals Group:   The focus of this group is to help patients establish daily goals to achieve during treatment and discuss how the patient can incorporate goal setting into their daily lives to aide in recovery. Orientation:   The focus of this group is to educate the patient on the purpose and policies of crisis stabilization and provide a format to answer questions about their admission.  The group details unit policies and expectations of patients while admitted.  Participation Level:  Active  Participation Quality:  Appropriate  Affect:  Appropriate  Cognitive:  Alert  Insight: Appropriate  Engagement in Group:  Engaged  Modes of Intervention:  Discussion and Education  Additional Comments:    Pt participated in goals/ orientation group. During this group pt received the daily packet, completed her self inventory sheet, and discussed the daily schedule. During group the MHT discussed different mindfulness techniques and coping skills. Pt was informed to speak with her tech if she needs assistance or has any questions.   Karen Keller 01/17/2019, 9:21 AM

## 2019-01-17 NOTE — Progress Notes (Signed)
The patient rated her day as a 5 out of a possible 10 since her doctor did not prescribe a certain medication for her. She admits to feeling hopeless at times and is working to alleviate those symptoms. Her goal for tomorrow is to try to be around people more often.

## 2019-01-17 NOTE — Progress Notes (Signed)
Patient ID: Karen Keller, female   DOB: 10-Jan-1956, 63 y.o.   MRN: [id number]  D: Patient has a flat affect but brightens on approach. Reports mood improved since admission. Currently denies any active SI and contracts for safety on the unit. Interacting well with other peers. A: Staff will monitor on q 15 minute checks, follow treatment plan, and give medications as ordered. R: Cooperative on the unit.

## 2019-01-17 NOTE — Progress Notes (Signed)
Recreation Therapy Notes  Animal-Assisted Activity (AAA) Program Checklist/Progress Notes Patient Eligibility Criteria Checklist & Daily Group note for Rec Tx Intervention  Date: 11.10.20 Time: 79 Location: [address]   AAA/T Program Assumption of Risk Form signed by Teacher, music or Parent Legal Guardian  YES   Patient is free of allergies or sever asthma  YES   Patient reports no fear of animals  YES   Patient reports no history of cruelty to animals  YES   Patient understands his/her participation is voluntary YES   Patient washes hands before animal contact YES   Patient washes hands after animal contact  YES   Behavioral Response: Engaged  Education: Contractor, Appropriate Animal Interaction   Education Outcome: Acknowledges understanding/In group clarification offered/Needs additional education.   Clinical Observations/Feedback: Pt attended and participated in group activity.    Victorino Sparrow, LRT/CTRS         Victorino Sparrow A 01/17/2019 3:29 PM

## 2019-01-17 NOTE — Progress Notes (Signed)
Patient ID: Karen Keller, female   DOB: June 13, 1955, 64 y.o.   MRN: [id number]  [place] NOVEL CORONAVIRUS (COVID-19) DAILY CHECK-OFF SYMPTOMS - answer yes or no to each - every day NO YES  Have you had a fever in the past 24 hours?  . Fever (Temp > 37.80C / 100F) X   Have you had any of these symptoms in the past 24 hours? . New Cough .  Sore Throat  .  Shortness of Breath .  Difficulty Breathing .  Unexplained Body Aches   X   Have you had any one of these symptoms in the past 24 hours not related to allergies?   . Runny Nose .  Nasal Congestion .  Sneezing   X   If you have had runny nose, nasal congestion, sneezing in the past 24 hours, has it worsened?  X   EXPOSURES - check yes or no X   Have you traveled outside the state in the past 14 days?  X   Have you been in contact with someone with a confirmed diagnosis of COVID-19 or PUI in the past 14 days without wearing appropriate PPE?  X   Have you been living in the same home as a person with confirmed diagnosis of COVID-19 or a PUI (household contact)?    X   Have you been diagnosed with COVID-19?    X              What to do next: Answered NO to all: Answered YES to anything:   Proceed with unit schedule Follow the [facility] Inpatient Flowsheet.

## 2019-01-18 LAB — URINE CULTURE: Culture: >=100000 — AB

## 2019-01-18 MED ORDER — HALOPERIDOL 2 MG PO TABS
2.0000 mg | ORAL_TABLET | Freq: Every day | ORAL | Status: DC
  Administered 2019-01-18 – 2019-01-22 (×5): 2 mg via ORAL
  Filled 2019-01-18 (×7): qty 1

## 2019-01-18 MED ORDER — PAROXETINE HCL 20 MG PO TABS
20.0000 mg | ORAL_TABLET | Freq: Every day | ORAL | Status: DC
  Administered 2019-01-19 – 2019-01-23 (×5): 20 mg via ORAL
  Filled 2019-01-18 (×7): qty 1

## 2019-01-18 MED ORDER — LINACLOTIDE 145 MCG PO CAPS
ORAL_CAPSULE | ORAL | Status: AC
  Administered 2019-01-18: 145 ug
  Filled 2019-01-18: qty 2
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
## 2019-01-18 NOTE — [facility] Group Notes (Signed)
Occupational Therapy Group Note  Date:  01/18/2019 Time:  3:08 PM  Group Topic/Focus:  Stress Management  Participation Level:  Active  Participation Quality:  Appropriate  Affect:  Flat  Cognitive:  Alert  Insight: Improving  Engagement in Group:  Engaged  Modes of Intervention:  Activity, Discussion, Education and Socialization  Additional Comments:    S: I did mine more inspirationally  O: Group focus on stress management this date. Pt to engage in group discussion on healthy coping skills to make master list for group to refer from throughout activity. Pt then to create vision board of desired coping skills using magazine cut outs. Plastic scissors with supervision used to maintain safety. Pt then to share vision board with group and if they learned new coping skill this date with discussion.  A: Pt very engaged, states cooking and pets help decrease her stress. Pt engaged in collage making and sharing with group. Chose many inspirational quotes.  P: OT group will be x1 per week while pt in [place], MSOT, OTR/L [place] Office: [place] 01/18/2019, 3:08 PM
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
## 2019-01-18 NOTE — Progress Notes (Signed)
   01/18/19 0050  Psych Admission Type (Psych Patients Only)  Admission Status Voluntary  Psychosocial Assessment  Patient Complaints Other (Comment) (back pain)  Eye Contact Fair  Facial Expression Flat  Affect Appropriate to circumstance  Speech Logical/coherent  Interaction Assertive  Motor Activity Other (Comment) (WNL)  Appearance/Hygiene Unremarkable  Behavior Characteristics Cooperative  Mood Depressed  Thought Process  Coherency WDL  Content WDL  Delusions None reported or observed  Perception Hallucinations  Hallucination Visual  Judgment WDL  Confusion None  Danger to Self  Current suicidal ideation? Denies  Self-Injurious Behavior No self-injurious ideation or behavior indicators observed or expressed   Agreement Not to Harm Self Yes  Description of Agreement verbally contracts for safety  Danger to Others  Danger to Others None reported or observed  D: Patient reports she had a good day and has been enjoying eating.  A: Medications administered as prescribed. Support and encouragement provided as needed.  R: Patient remains safe on the unit. Will continue to monitor for safety and stability.

## 2019-01-18 NOTE — [facility] Group Notes (Signed)
01/18/2019 8:45am Type of Group and Topic: Psychoeducational Group: Discharge Planning  Participation Level: Active  Description of Group Discharge planning group reviews patient's anticipated discharge plans and assists patients to anticipate and address any barriers to wellness/recovery in the community. Suicide prevention education is reviewed with patients in group. Therapeutic Goals 1. Patients will state their anticipated discharge plan and mental health aftercare 2. Patients will identify potential barriers to wellness in the community setting 3. Patients will engage in problem solving, solution focused discussion of ways to anticipate and address barriers to wellness/recovery   Summary of Patient Progress:  Karen Keller was engaged and participated throughout the group session. Karen Keller reports that she plans to utilize coping skills when she begins to develop negative thoughts.   Plan for Discharge/Comments:  Karen Keller shared that she does not wish to return to her current group home placement, however understands that she has to do so at this time.   Transportation Means:  Group home will transport   Supports: Group home staff/ Outpatient providers.    Therapeutic Modalities: Motivational Interviewing     Karen Keller, MSW, [place] Worker [facility]  Phone: [phone] 01/18/2019 3:43 PM

## 2019-01-18 NOTE — Plan of Care (Signed)
  Problem: Activity: Goal: Interest or engagement in activities will improve Outcome: Progressing   Problem: Coping: Goal: Ability to verbalize frustrations and anger appropriately will improve Outcome: Progressing Goal: Ability to demonstrate self-control will improve Outcome: Progressing   Problem: Safety: Goal: Periods of time without injury will increase Outcome: Progressing   Problem: Coping: Goal: Coping ability will improve Outcome: Progressing

## 2019-01-18 NOTE — Progress Notes (Signed)
Patient ID: Karen Keller, female   DOB: 1955-07-27, 63 y.o.   MRN: [id number]  [place] NOVEL CORONAVIRUS (COVID-19) DAILY CHECK-OFF SYMPTOMS - answer yes or no to each - every day NO YES  Have you had a fever in the past 24 hours?  . Fever (Temp > 37.80C / 100F) X   Have you had any of these symptoms in the past 24 hours? . New Cough .  Sore Throat  .  Shortness of Breath .  Difficulty Breathing .  Unexplained Body Aches   X   Have you had any one of these symptoms in the past 24 hours not related to allergies?   . Runny Nose .  Nasal Congestion .  Sneezing   X   If you have had runny nose, nasal congestion, sneezing in the past 24 hours, has it worsened?  X   EXPOSURES - check yes or no X   Have you traveled outside the state in the past 14 days?  X   Have you been in contact with someone with a confirmed diagnosis of COVID-19 or PUI in the past 14 days without wearing appropriate PPE?  X   Have you been living in the same home as a person with confirmed diagnosis of COVID-19 or a PUI (household contact)?    X   Have you been diagnosed with COVID-19?    X              What to do next: Answered NO to all: Answered YES to anything:   Proceed with unit schedule Follow the [facility] Inpatient Flowsheet.

## 2019-01-18 NOTE — Progress Notes (Signed)
Adult Psychoeducational Group Note  Date:  01/18/2019 Time:  4:09 PM  Group Topic/Focus:  Making Healthy Choices:   The focus of this group is to help patients identify negative/unhealthy choices they were using prior to admission and identify positive/healthier coping strategies to replace them upon discharge.  Participation Level:  Active  Participation Quality:  Appropriate  Affect:  Appropriate  Cognitive:  Alert  Insight: Appropriate  Engagement in Group:  Engaged  Modes of Intervention:  Activity and Discussion  Additional Comments:     Pt participated in group with the MHT. Group focused on positive coping skills and making healthy choices. Pt's were unable to go to the gym today, therefore participated in an exercise activity. Pt participated in 2-3 exercises before getting tired and sitting down. As a group it was discussed why making healthy choices such as exercising is important and what the pt needs to change to better their lifestyle.   Lita Mains 01/18/2019, 4:09 PM

## 2019-01-19 MED ORDER — LIDOCAINE 5 % EX PTCH
1.0000 | MEDICATED_PATCH | CUTANEOUS | Status: DC
  Administered 2019-01-19 – 2019-01-22 (×4): 1 via TRANSDERMAL
  Filled 2019-01-19 (×6): qty 1

## 2019-01-19 NOTE — Progress Notes (Signed)
CSW faxed a co-signed FL-2 to patient's group home in anticipation of discharge.  Fax successfully sent to: [facility] Fax # [phone]  Stephanie Acre, [place] Social Worker

## 2019-01-19 NOTE — Progress Notes (Signed)
[place] and I held a WRAP group on the Adult Unit dayroom. Wellness Recovery Action Plan was what was discussed which Pt engaged in sharing the concerns that she was having which caused her to have low self esteem. Pt was able to talk about how it helps an hurts her at the same time. Pt stated that she was thankful to be able to attend the group, Pt stated she felt good to share an participate in WRAP group. CPSS praised Pt for sharing what she was dealing with.
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
## 2019-01-19 NOTE — Progress Notes (Signed)
Adult Psychoeducational Group Note  Date:  01/19/2019 Time:  9:31 PM  Group Topic/Focus:  Wrap-Up Group:   The focus of this group is to help patients review their daily goal of treatment and discuss progress on daily workbooks.  Participation Level:  Active  Participation Quality:  Appropriate  Affect:  Appropriate  Cognitive:  Alert  Insight: Appropriate  Engagement in Group:  Engaged  Modes of Intervention:  Discussion  Additional Comments:  Pt stated goal for today was to get new medications. Pt stated that she accomplished her goal. Pt stated one new coping skill is talking to others.   Wynelle Fanny R 01/19/2019, 9:31 PM

## 2019-01-19 NOTE — Progress Notes (Signed)
Dar Note: Patient is calm and appropriate on the unit.  Medication given as prescribed.  Routine safety checks maintained every 15 minutes.  Support and encouragement offered as needed.  Denies suicidal thoughts, auditory and visual hallucinations.  Patient is safe on the unit.

## 2019-01-19 NOTE — Progress Notes (Signed)
D-Pt is alert and oriented x 4. Pt denies  HI/SI/AVH.  Per pt's self-inventory sheet:   Pt rated her depression at a level of  2/10,  anxiety at a level of  2/10, and hopelesness at a 2/10.     A-RN encouraged pt to express her thoughts and feelings.  RN provided emotional support and encouragement.  RN administered pt's medications as prescribed by pt's provider.  RN changed dressings on pt's left forearm and right wrist. RN and MHT maintained q15 min safety checks.   R-Pt stated that her mood has been improving and she feels ready to go home.  Pt reported that her faith in God helps pt cope with life's challenges.  Pt stated that she is happy that the wounds on her arms from cutting herself are improving.  Pt reported that she would let staff member know if pt needs assistance.  Pt remains safe on the unit.  RN will continue to monitor.

## 2019-01-19 NOTE — Progress Notes (Signed)
Adult Psychoeducational Group Note  Date:  01/19/2019 Time:  10:46 AM  Group Topic/Focus:  Crisis Planning:   The purpose of this group is to help patients create a crisis plan for use upon discharge or in the future, as needed.  Participation Level:  Active  Participation Quality:  Appropriate  Affect:  Appropriate  Cognitive:  Alert  Insight: Good  Engagement in Group:  Engaged  Modes of Intervention:  Activity and Discussion  Additional Comments:    Pt participated in group with the MHT. Group started with an icebreaker activity using the question ball. Today's topic for the day is crisis prevention. Each pt shared what crisis brought them to the hospital and how they will prevent a similar crisis from occurring in the future. Pt shared triggers and coping skills for her given crisis.  Karen Keller 01/19/2019, 10:46 AM

## 2019-01-19 NOTE — NC FL2 (Addendum)
[place] LEVEL OF CARE SCREENING TOOL     IDENTIFICATION  Patient Name: Karen Keller Birthdate: 27-Apr-1955 Sex: female Admission Date (Current Location): 01/16/2019  [facility] and [place] Number:  Herbalist and Address:  [facility])     [place], [place] [address] Provider Number: [id number]  Attending Physician Name and Address:  Sharma Covert, MD  Relative Name and Phone Number:       Current Level of Care: Hospital Recommended Level of Care: [facility] Prior Approval Number:    Date Approved/Denied:   PASRR Number: [id number]  Discharge Plan: Other (Comment)(Family Care Home)    Current Diagnoses: Patient Active Problem List   Diagnosis Date Noted  . Schizoaffective disorder, bipolar type ([place]) 01/16/2019  . GERD (gastroesophageal reflux disease) 07/28/2018  . Schizoaffective disorder ([place]) 04/11/2018  . MDD (major depressive disorder), severe ([place]) 04/06/2018  . Macrocytosis 02/16/2018  . COPD (chronic obstructive pulmonary disease) ([place]) 02/16/2018  . Bipolar disorder ([place]) 02/16/2018  . HTN (hypertension) 02/16/2018  . H/O adenomatous polyp of colon   . Chronic hepatitis C without hepatic coma ([place]) 11/12/2017  . Abnormal LFTs 06/15/2017  . Constipation 06/15/2017  . Anemia 03/24/2017  . Bilateral lower extremity edema 01/01/2016  . Encounter for screening colonoscopy 01/01/2016  . Macrocytic anemia 01/01/2016  . Major depressive disorder, single episode 03/26/2015    Orientation RESPIRATION BLADDER Height & Weight     Self, Time, Situation, Place  Normal Continent Weight: 78.9 kg Height:  5\' 8"  (172.7 cm)  BEHAVIORAL SYMPTOMS/MOOD NEUROLOGICAL BOWEL NUTRITION STATUS      Continent Diet(Regular)  AMBULATORY STATUS COMMUNICATION OF NEEDS Skin   Independent Verbally Normal                       Personal Care Assistance Level of Assistance  Bathing, Dressing, Feeding, Total care Bathing  Assistance: Independent Feeding assistance: Independent Dressing Assistance: Independent Total Care Assistance: Independent   Functional Limitations Info             SPECIAL CARE FACTORS FREQUENCY                       Contractures Contractures Info: Not present    Additional Factors Info  Code Status, Allergies Code Status Info: Full Allergies Info: Neurontin, Septra Ds           Current Medications (01/19/2019):  This is the current hospital active medication list Current Facility-Administered Medications  Medication Dose Route Frequency Provider Last Rate Last Dose  . acetaminophen (TYLENOL) tablet 650 mg  650 mg Oral Q6H PRN Lindon Romp A, NP   650 mg at 01/19/19 0718  . albuterol (VENTOLIN HFA) 108 (90 Base) MCG/ACT inhaler 2 puff  2 puff Inhalation PRN Lindon Romp A, NP      . alum & mag hydroxide-simeth (MAALOX/MYLANTA) 200-200-20 MG/5ML suspension 30 mL  30 mL Oral Q4H PRN Lindon Romp A, NP      . calcium-vitamin D (OSCAL WITH D) 500-200 MG-UNIT per tablet 1 tablet  1 tablet Oral BID Lindon Romp A, NP   1 tablet at 01/19/19 0814  . cephALEXin (KEFLEX) capsule 500 mg  500 mg Oral Q12H Lindon Romp A, NP   500 mg at 01/19/19 0814  . divalproex (DEPAKOTE SPRINKLE) capsule 500 mg  500 mg Oral BID Lindon Romp A, NP   500 mg at 01/19/19 0815  . docusate sodium (COLACE) capsule 100  mg  100 mg Oral Daily Lindon Romp A, NP   100 mg at 01/19/19 [id number]  . famotidine (PEPCID) tablet 40 mg  40 mg Oral BID PRN Lindon Romp A, NP      . haloperidol (HALDOL) tablet 2 mg  2 mg Oral QHS Cobos, Myer Peer, MD   2 mg at 01/18/19 2126  . hydrALAZINE (APRESOLINE) tablet 25 mg  25 mg Oral QID Lindon Romp A, NP   25 mg at 01/19/19 0814  . hydrOXYzine (ATARAX/VISTARIL) tablet 10 mg  10 mg Oral BID PRN Rozetta Nunnery, NP   10 mg at 01/16/19 2217  . linaclotide (LINZESS) capsule 290 mcg  290 mcg Oral QAC breakfast Lindon Romp A, NP   290 mcg at 01/19/19 0718  . lithium carbonate  capsule 300 mg  300 mg Oral BID Lindon Romp A, NP   300 mg at 01/19/19 0814  . LORazepam (ATIVAN) tablet 0.25 mg  0.25 mg Oral Q12H PRN Lindon Romp A, NP   0.25 mg at 01/17/19 1232  . magnesium hydroxide (MILK OF MAGNESIA) suspension 30 mL  30 mL Oral Daily PRN Lindon Romp A, NP      . multivitamin with minerals tablet 1 tablet  1 tablet Oral Daily Lindon Romp A, NP   1 tablet at 01/19/19 0814  . oxybutynin (DITROPAN-XL) 24 hr tablet 10 mg  10 mg Oral Daily Lindon Romp A, NP   10 mg at 01/19/19 0814  . pantoprazole (PROTONIX) EC tablet 40 mg  40 mg Oral Daily Lindon Romp A, NP   40 mg at 01/19/19 0814  . PARoxetine (PAXIL) tablet 20 mg  20 mg Oral Daily Cobos, Myer Peer, MD   20 mg at 01/19/19 0814  . senna (SENOKOT) tablet 8.6 mg  1 tablet Oral Daily PRN Lindon Romp A, NP      . tiotropium (SPIRIVA) inhalation capsule ([facility] use ONLY) 18 mcg  18 mcg Inhalation Daily Lindon Romp A, NP   18 mcg at 01/19/19 0815  . traMADol (ULTRAM) tablet 50 mg  50 mg Oral Q12H PRN Cobos, Myer Peer, MD   50 mg at 01/18/19 1405  . tuberculin injection 5 Units  5 Units Intradermal Once Connye Burkitt, NP   5 Units at 01/17/19 1127     Discharge Medications: Please see discharge summary for a list of discharge medications.  Relevant Imaging Results:  Relevant Lab Results:   Additional Information SSN: [id number] [place], [place]

## 2019-01-20 LAB — VALPROIC ACID LEVEL: Valproic Acid Lvl: 44 ug/mL — ABNORMAL LOW (ref 50.0–100.0)

## 2019-01-20 LAB — LITHIUM LEVEL: Lithium Lvl: 0.38 mmol/L — ABNORMAL LOW (ref 0.60–1.20)

## 2019-01-20 MED ORDER — TRAMADOL HCL 50 MG PO TABS
25.0000 mg | ORAL_TABLET | Freq: Two times a day (BID) | ORAL | Status: DC | PRN
  Administered 2019-01-20 – 2019-01-22 (×4): 25 mg via ORAL
  Filled 2019-01-20 (×4): qty 1

## 2019-01-20 MED ORDER — LITHIUM CARBONATE 300 MG PO CAPS
300.0000 mg | ORAL_CAPSULE | ORAL | Status: DC
  Administered 2019-01-21 – 2019-01-23 (×3): 300 mg via ORAL
  Filled 2019-01-20 (×4): qty 1

## 2019-01-20 MED ORDER — DIVALPROEX SODIUM 125 MG PO CSDR
750.0000 mg | DELAYED_RELEASE_CAPSULE | Freq: Every day | ORAL | Status: DC
  Administered 2019-01-20: 750 mg via ORAL
  Filled 2019-01-20 (×3): qty 6

## 2019-01-20 MED ORDER — LITHIUM CARBONATE 300 MG PO CAPS
450.0000 mg | ORAL_CAPSULE | Freq: Every day | ORAL | Status: DC
  Administered 2019-01-20 – 2019-01-22 (×3): 450 mg via ORAL
  Filled 2019-01-20 (×4): qty 1

## 2019-01-20 MED ORDER — DIVALPROEX SODIUM 125 MG PO CSDR
500.0000 mg | DELAYED_RELEASE_CAPSULE | ORAL | Status: DC
  Administered 2019-01-21: 500 mg via ORAL
  Filled 2019-01-20 (×3): qty 4

## 2019-01-20 MED ORDER — TUBERCULIN PPD 5 UNIT/0.1ML ID SOLN: 5.0000 [IU] | Freq: Once | INTRADERMAL | Status: DC
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
## 2019-01-20 NOTE — Progress Notes (Signed)
PPD placed R arm on 01/17/2019.  MD reviewed 03/20/2018 - Results NEGATIVE.  Staff nurse reviewed 01/20/2019 - Results NEGATIVE.

## 2019-01-20 NOTE — [facility] Group Notes (Signed)
LCSW Group Therapy Note  01/20/2019 3:06 PM  Type of Therapy/Topic: Group Therapy: Feelings about Diagnosis  Participation Level: Active   Description of Group:  This group will allow patients to explore their thoughts and feelings about diagnoses they have received. Patients will be guided to explore their level of understanding and acceptance of these diagnoses. Facilitator will encourage patients to process their thoughts and feelings about the reactions of others to their diagnosis and will guide patients in identifying ways to discuss their diagnosis with significant others in their lives. This group will be process-oriented, with patients participating in exploration of their own experiences, giving and receiving support, and processing challenge from other group members.  Therapeutic Goals: 1. Patient will demonstrate understanding of diagnosis as evidenced by identifying two or more symptoms of the disorder 2. Patient will be able to express two feelings regarding the diagnosis 3. Patient will demonstrate their ability to communicate their needs through discussion and/or role play  Summary of Patient Progress: Karen Keller shared her experience of being diagnosed with schizoaffective disorder and how she has suicidal ideations. She shared that her group home staff and management is unsupportive, but her outpatient psychiatrist is a good support.  Therapeutic Modalities:  Cognitive Behavioral Therapy Brief Therapy Feelings Identification   Stephanie Acre, MSW, [place] Social Worker
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
## 2019-01-20 NOTE — Progress Notes (Signed)
D:  Patient's self inventory sheet, patient sleeps good, no sleep medication.  Good appetite, normal energy level, good concentration.  Denied depression and hopeless, anxiety #5.  Denied withdrawals, tremors.  Denied SI.  Physical problems, pain, lower back.  Pain medication helpful, worst pain #10.  Goal is work on [employer].  Plans to do it.  No discharge plans. A:  Medications administered per  MD orders.  Emotional support and encouragement given patient. R:  Denied SI and HI, contracts for safety.  Denied A/V hallucinations.  Safety maintained with 15 minute checks.

## 2019-01-20 NOTE — Plan of Care (Signed)
Nurse discussed anxiety, depression and coping skills with patient.  

## 2019-01-20 NOTE — Progress Notes (Signed)
   01/20/19 2300  Psych Admission Type (Psych Patients Only)  Admission Status Voluntary  Psychosocial Assessment  Patient Complaints Anxiety  Eye Contact Fair  Facial Expression Flat  Affect Appropriate to circumstance  Speech Logical/coherent  Interaction Assertive  Motor Activity Other (Comment) (steady gait)  Appearance/Hygiene Unremarkable  Behavior Characteristics Anxious  Mood Anxious  Thought Process  Coherency WDL  Content WDL  Delusions None reported or observed  Perception WDL  Hallucination None reported or observed  Judgment Impaired  Confusion None  Danger to Self  Current suicidal ideation? Denies  Self-Injurious Behavior No self-injurious ideation or behavior indicators observed or expressed   Agreement Not to Harm Self Yes  Description of Agreement verbally contracts for safety  Danger to Others  Danger to Others None reported or observed   Pt visible on unit this evening , stated she was better.

## 2019-01-20 NOTE — Progress Notes (Signed)
Patient ID: Karen Keller, female   DOB: April 16, 1955, 63 y.o.   MRN: [id number] D: Assumed care patient @ 2330. Patient in bed sleeping. Respiration regular and unlabored. No sign of distress noted at this time A: 15 mins checks for safety. R: Patient remains safe.

## 2019-01-21 MED ORDER — DIVALPROEX SODIUM 125 MG PO CSDR
500.0000 mg | DELAYED_RELEASE_CAPSULE | Freq: Two times a day (BID) | ORAL | Status: DC
  Administered 2019-01-22 – 2019-01-23 (×3): 500 mg via ORAL
  Filled 2019-01-21 (×5): qty 4

## 2019-01-21 NOTE — Progress Notes (Signed)
[facility] NOVEL CORONAVIRUS (COVID-19) DAILY CHECK-OFF SYMPTOMS - answer yes or no to each - every day NO YES  Have you had a fever in the past 24 hours?  . Fever (Temp > 37.80C / 100F) X   Have you had any of these symptoms in the past 24 hours? . New Cough .  Sore Throat  .  Shortness of Breath .  Difficulty Breathing .  Unexplained Body Aches   X   Have you had any one of these symptoms in the past 24 hours not related to allergies?   . Runny Nose .  Nasal Congestion .  Sneezing   X   If you have had runny nose, nasal congestion, sneezing in the past 24 hours, has it worsened?  X   EXPOSURES - check yes or no X   Have you traveled outside the state in the past 14 days?  X   Have you been in contact with someone with a confirmed diagnosis of COVID-19 or PUI in the past 14 days without wearing appropriate PPE?  X   Have you been living in the same home as a person with confirmed diagnosis of COVID-19 or a PUI (household contact)?    X   Have you been diagnosed with COVID-19?    X              What to do next: Answered NO to all: Answered YES to anything:   Proceed with unit schedule Follow the [facility] Inpatient Flowsheet.   
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
## 2019-01-21 NOTE — Progress Notes (Addendum)
D. Pt presents with a flat affect/ anxious mood and calm, cooperative behavior. Per pt's self inventory, pt rated her depression, hopelessness and anxiety a 5/0/5, respectively. Pt c/o chronic lower back pain 9/10 - tramadol a little helpful (pain 9/10 to 6/10). Pt currently denies SI/HI and AVH  A. Labs and vitals monitored. Pt compliant with medications.Pt supported emotionally and encouraged to express concerns and ask questions.   R. Pt remains safe with 15 minute checks. Will continue POC.

## 2019-01-21 NOTE — [facility] Group Notes (Signed)
LCSW Group Therapy Note  Date/Time:  01/21/2019   10:00AM-11:00AM  Type of Therapy and Topic:  Group Therapy:  Fears and Unhealthy/Healthy Coping Skills  Participation Level:  Minimal   Description of Group:  The focus of this group was to discuss some of the prevalent fears that patients experience, and to identify the commonalities among group members.  A fun exercise was used to initiate the discussion, followed by writing on the white board a group-generated list of unhealthy coping and healthy coping techniques to deal with each fear.    Therapeutic Goals: 1. Patient will be able to distinguish between healthy and unhealthy coping skills 2. Patient will identify and describe 3 fears they experience 3. Patient will identify one positive coping strategy for each fear they experience 4. Patient will respond empathetically to peers' statements regarding fears they experience  Summary of Patient Progress:  The patient expressed her fears on paper but did not participate verbally in group very often.  She at times appeared to fall asleep as well.  Therapeutic Modalities Cognitive Behavioral Therapy Motivational Interviewing  Selmer Dominion, LCSW

## 2019-01-21 NOTE — Progress Notes (Signed)
Adult Psychoeducational Group Note  Date:  01/21/2019 Time:  9:36 PM  Group Topic/Focus:  Wrap-Up Group:   The focus of this group is to help patients review their daily goal of treatment and discuss progress on daily workbooks.  Participation Level:  Active  Participation Quality:  Appropriate  Affect:  Appropriate  Cognitive:  Appropriate  Insight: Appropriate and Good  Engagement in Group:  Engaged  Modes of Intervention:  Discussion  Additional Comments:  Pt said her day was a 43. Her goal for today take her medication to feel better. She did achieve her goal. The coping skills she found helpful talking to others. She has not questions or concerns.  Lenice Llamas Long 01/21/2019, 9:36 PM

## 2019-01-21 NOTE — Progress Notes (Signed)
[facility] Group Notes:  (Nursing/MHT/Case Management/Adjunct)  Date:  01/21/2019  Time:  1345  Type of Therapy:  Nurse Education  Participation Level:  Did Not Attend  Marissa Calamity 01/21/2019, 2:27 PM

## 2019-01-22 NOTE — Progress Notes (Signed)
[facility] Group Notes:  (Nursing/MHT/Case Management/Adjunct)  Date:  01/22/2019  Time:  2030  Type of Therapy:  wrap up group  Participation Level:  Active  Participation Quality:  Appropriate, Attentive, Sharing and Supportive  Affect:  Appropriate  Cognitive:  Appropriate  Insight:  Improving  Engagement in Group:  Engaged  Modes of Intervention:  Clarification, Education and Support  Summary of Progress/Problems:Pt is looking forward to a possible discharge tomorrow. Pt plans on moving to a new house at her current group home but still wants to pursue a new group home. Pt shared that she has contacted [facility] and has Josph Macho looking into other options for her. Pt is grateful to be alive.    Shellia Cleverly 01/22/2019, 10:24 PM

## 2019-01-22 NOTE — Progress Notes (Signed)
D. Pt reports improving mood today, complained of dizziness this am, but that has since resolved. Per pt's self inventory, pt rated her depression, hopelessness and anxiety all 0's. Pt writes that her most important goal is "discharge".Pt currently denies SI/HI and AVH   A. Labs and vitals monitored. Pt compliant with medications. Pt supported emotionally and encouraged to express concerns and ask questions.   R. Pt remains safe with 15 minute checks. Will continue POC.

## 2019-01-22 NOTE — Progress Notes (Signed)
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
## 2019-01-23 LAB — LITHIUM LEVEL: Lithium Lvl: 0.6 mmol/L (ref 0.60–1.20)

## 2019-01-23 LAB — VALPROIC ACID LEVEL: Valproic Acid Lvl: 48 ug/mL — ABNORMAL LOW (ref 50.0–100.0)

## 2019-01-23 MED ORDER — DIVALPROEX SODIUM 125 MG PO CSDR: 500.0000 mg | DELAYED_RELEASE_CAPSULE | Freq: Two times a day (BID) | ORAL | 0 refills | Status: DC

## 2019-01-23 MED ORDER — HYDROXYZINE HCL 10 MG PO TABS: 10.0000 mg | ORAL_TABLET | Freq: Two times a day (BID) | ORAL | 0 refills | Status: DC | PRN

## 2019-01-23 MED ORDER — LITHIUM CARBONATE 150 MG PO CAPS: 450.0000 mg | ORAL_CAPSULE | Freq: Every day | ORAL | 0 refills | Status: DC

## 2019-01-23 MED ORDER — PAROXETINE HCL 20 MG PO TABS: 20.0000 mg | ORAL_TABLET | Freq: Every day | ORAL | 0 refills | Status: DC

## 2019-01-23 MED ORDER — LITHIUM CARBONATE 300 MG PO CAPS: 300.0000 mg | ORAL_CAPSULE | ORAL | 0 refills | Status: DC

## 2019-01-23 MED ORDER — LIDOCAINE 5 % EX PTCH: 1.0000 | MEDICATED_PATCH | CUTANEOUS | 0 refills | Status: DC

## 2019-01-23 MED ORDER — HALOPERIDOL 2 MG PO TABS: 2.0000 mg | ORAL_TABLET | Freq: Every day | ORAL | 0 refills | Status: DC
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
## 2019-01-23 NOTE — Progress Notes (Signed)
Patient ID: Karen Keller, female   DOB: 07-16-1955, 63 y.o.   MRN: [id number]   [place] NOVEL CORONAVIRUS (COVID-19) DAILY CHECK-OFF SYMPTOMS - answer yes or no to each - every day NO YES  Have you had a fever in the past 24 hours?  . Fever (Temp > 37.80C / 100F) X   Have you had any of these symptoms in the past 24 hours? . New Cough .  Sore Throat  .  Shortness of Breath .  Difficulty Breathing .  Unexplained Body Aches   X   Have you had any one of these symptoms in the past 24 hours not related to allergies?   . Runny Nose .  Nasal Congestion .  Sneezing   X   If you have had runny nose, nasal congestion, sneezing in the past 24 hours, has it worsened?  X   EXPOSURES - check yes or no X   Have you traveled outside the state in the past 14 days?  X   Have you been in contact with someone with a confirmed diagnosis of COVID-19 or PUI in the past 14 days without wearing appropriate PPE?  X   Have you been living in the same home as a person with confirmed diagnosis of COVID-19 or a PUI (household contact)?    X   Have you been diagnosed with COVID-19?    X              What to do next: Answered NO to all: Answered YES to anything:   Proceed with unit schedule Follow the [facility] Inpatient Flowsheet.

## 2019-01-23 NOTE — Progress Notes (Signed)
CSW faxed the following to [facility] [phone]) to coordinate discharge: FL-2, medication list, H&P, progress note with PPD result, COVID result, and facesheet.  Stephanie Acre, LCSW-A Clinical Social Worker

## 2019-01-23 NOTE — Progress Notes (Signed)
Spiritual care group on grief and loss facilitated by chaplain Jerene Pitch MDiv, [facility]  Group Goal:  Support / Education around grief and loss Members engage in facilitated group support and psycho-social education.  Group Description:  Following introductions and group rules, group members engaged in facilitated group dialog and support around topic of loss, with particular support around experiences of loss in their lives. Group Identified types of loss (relationships / self / things) and identified patterns, circumstances, and changes that precipitate losses. Reflected on thoughts / feelings around loss, normalized grief responses, and recognized variety in grief experience.   Group noted Worden's four tasks of grief in discussion.  Group drew on Adlerian / Rogerian, narrative, MI, Patient Progress:  [place] was present throughout group.  Presented as engaged.   Spoke with group about her process of coping with boyfriend's death to lung cancer 4 days after diagnosis.  States she had been with this person for 10 years.   Group provided normalization and support of grief - especially around feelings of bewilderment.  Micki spoke about learning to live in her home again - which she shared with boyfriend.   Unclear if boyfriend was also a group home resident.

## 2019-01-23 NOTE — Progress Notes (Signed)
  [facility] Adult Case Management Discharge Plan :  Will you be returning to the same living situation after discharge:  Yes,  back to her group home. At discharge, do you have transportation home?: Yes,  taking Lyft/[employer] at 1:00pm. Do you have the ability to pay for your medications: Yes,  Medicare.  Release of information consent forms completed and in the chart.  Patient to Follow up at: Follow-up Information    [facility] Follow up on 01/24/2019.   Why: You are scheduled for an appointment with Dr. Josph Macho on Tuesday Novmeber 17th at 9:15am.  Please contact [facility] once discharged to let them know if you want this appointment to be in person or by video call. Contact information: [address] [place], [place] [address] Phone: [phone] Fax: [phone]       [facility] Follow up on 02/17/2019.   Why: You are scheduled for an appointment for therapy on Friday, December 11th at 10:00am. Contact information: [place] [address] [phone]           Next level of care provider has access to [place] and Suicide Prevention discussed: Yes,  with patient and group home owner.  Have you used any form of tobacco in the last 30 days? (Cigarettes, Smokeless Tobacco, Cigars, and/or Pipes): Yes  Has patient been referred to the Quitline?: Patient refused referral  Patient has been referred for addiction treatment: Yes  Joellen Jersey, [place] 01/23/2019, 11:04 AM

## 2019-01-23 NOTE — Progress Notes (Signed)
Patient ID: Karen Keller, female   DOB: 1955-12-15, 63 y.o.   MRN: [id number]  D: Pt alert and oriented on the unit.   A: Education, support, and encouragement provided. Discharge summary, medications and follow up appointments reviewed with pt. Suicide prevention resources provided, including "My 3 App." Pt's belongings in locker # 8 returned and belongings sheet signed.  R: Pt denies SI/HI, A/VH, pain, or any concerns at this time. Pt ambulatory on and off unit. Pt discharged to lobby.

## 2019-01-23 NOTE — Progress Notes (Addendum)
CSW spoke with [name] of [facility] (phone: [phone]). She states she has not received the documentation that was faxed over to her, but confirms the fax contains all requested documentation (FL-2, med list, H&P, TB and Covid results). [name] confirmed the fax number for CSW (fax: [phone]).  CSW retrieved a successful fax transmission notice, but will re-fax documentation.  [name] states she is not able to provide transportation for this patient. She provided CSW with a  address for this patient to utilize Lyft/[employer] transportation.   Update 9:55am CSW called Ms.Rucker a second time to confirm receipt of fax and to coordinate discharge planning. CSW left a detailed HIPAA compliant VM requesting a returned call.   Update 11:00am CSW called Ms.Rucker a third time. Ms.Rucker reports staff have confirmed receipt of the fax but she is not in office to review documentation. She is agreeable to the patient returning today, CSW inquired if a 1:00pm Lyft would work, Ms.Rucker was agreeable.   Stephanie Acre, LCSW-A Clinical Social Worker

## 2019-01-23 NOTE — Progress Notes (Signed)
Recreation Therapy Notes  Date:  11.16.20 Time: 0930 Location: [address] Dayroom  Group Topic: Stress Management  Goal Area(s) Addresses:  Patient will identify positive stress management techniques. Patient will identify benefits of using stress management post d/c.  Behavioral Response: Engaged  Intervention: Stress Management  Activity :  [facility]  LRT read a script that focused on taking a walk through a relaxing forest.  Patients were to listen and follow along as script was read to engage in activity.  Education:  Stress Management, Discharge Planning.   Education Outcome: Acknowledges Education  Clinical Observations/Feedback: Pt attended and participated in activity.    Victorino Sparrow, LRT/CTRS     Victorino Sparrow A 01/23/2019 11:04 AM

## 2019-01-23 NOTE — [facility] Suicide Risk Assessment (Signed)
[facility] Discharge Suicide Risk Assessment   Principal Problem: Schizoaffective Disorder Discharge Diagnoses: Active Problems:   Schizoaffective disorder, bipolar type ([place])   Total Time spent with patient: 30 minutes  Musculoskeletal: Strength & Muscle Tone: within normal limits Gait & Station: normal Patient leans: N/A  Psychiatric Specialty Exam: Review of Systems  Psychiatric/Behavioral: Negative for depression.   at this time denies dizziness or lightheadedness.  Denies chest pain, no shortness of breath at room air, no vomiting, no fever, no chills.  Blood pressure (!) 151/74, pulse 64, temperature 97.9 F (36.6 C), temperature source Oral, resp. rate 20, height 5\' 8"  (1.727 m), weight 78.9 kg, SpO2 99 %.Body mass index is 26.46 kg/m.  General Appearance: Well Groomed  Engineer, water::  Good  Speech:  Normal Rate409  Volume:  Normal  Mood:  Describes noticeably improved mood compared to how she felt on admission  Affect:  Appropriate, reactive, smiles appropriately during session, vaguely anxious but less so today  Thought Process:  Linear and Descriptions of Associations: Intact  Orientation:  Full (Time, Place, and Person)  Thought Content:  Denies hallucinations, no delusions expressed, does not appear internally preoccupied  Suicidal Thoughts:  No denies suicidal or self-injurious ideations  Homicidal Thoughts:  No  Memory:  Recent and remote grossly intact  Judgement:  Other:  Improving  Insight:  Improving  Psychomotor Activity:  Normal-no restlessness or agitation.  No significant distal tremors  Concentration:  Good  Recall:  Good  Fund of Knowledge:Good  Language: Good  Akathisia:  Negative  Handed:  Right  AIMS (if indicated):     Assets:  Communication Skills Desire for Improvement Resilience  Sleep:  Number of Hours: 6.25  Cognition: WNL  ADL's:  Intact   Mental Status Per Nursing Assessment::   On Admission:  Suicidal ideation indicated by patient,  Suicidal ideation indicated by others, Self-harm thoughts, Self-harm behaviors, Suicide plan, Belief that plan would result in death, Intention to act on suicide plan  Demographic Factors:  63 year old female, lives in group home environment  Loss Factors: No specific stressors were identified, history of chronic mental illness.  Historical Factors: Has been diagnosed with schizoaffective disorder in the past.  History of prior psychiatric admissions.  Risk Reduction Factors:   Living with another person, especially a relative and Positive coping skills or problem solving skills  Continued Clinical Symptoms:  Today patient presents alert, attentive, pleasant on approach, describes improved mood and presents with a reactive/brighter/mildly anxious affect today.  No thought disorder.  Denies suicidal ideations.  No homicidal ideations.  No hallucinations.  No delusions expressed.  Future oriented. Yesterday had reported some dizziness/lightheadedness.  Does not endorse these symptoms today.  Gait is steady.  BP today is 151/74, pulse 64. Repeat lithium serum level 0.60, valproic acid serum level 48.  This is slightly subtherapeutic but we decided not to increase Depakote dose further reports that higher doses might cause side effects and she is currently stable. We have reviewed side effect profile.  Patient has been on lithium and Depakote for "a long time" without side effects.  She is also now on low-dose Haldol which she has tolerated well thus far.  She has identified Haldol as an effective medication for history of visual hallucinations and denies any current or recent hallucinations/does not appear internally preoccupied. Behavior on unit has been in good control.  Patient pleasant on approach.  Cognitive Features That Contribute To Risk:  No gross cognitive deficits noted upon discharge. Is alert ,  attentive, and oriented x 3    Suicide Risk:  Mild:  Suicidal ideation of limited  frequency, intensity, duration, and specificity.  There are no identifiable plans, no associated intent, mild dysphoria and related symptoms, good self-control (both objective and subjective assessment), few other risk factors, and identifiable protective factors, including available and accessible social support.  Follow-up Information    [facility] Follow up on 01/24/2019.   Why: You are scheduled for an appointment with Dr. Josph Macho on Tuesday Novmeber 17th at 9:15am.  Please contact [facility] once discharged to let them know if you want this appointment to be in person or by video call. Contact information: [address] [place], [place] [address] Phone: [phone] Fax: [phone]       [facility] Follow up on 02/17/2019.   Why: You are scheduled for an appointment for therapy on Friday, December 11th at 10:00am. Contact information: [address] [place] [id number] [phone]           Plan Of Care/Follow-up recommendations:  Activity:  As tolerated Diet:  Heart healthy Tests:  NA Other:  See below  Patient is discharging today. Unit in good spirits.  Returning to [place].  Plans to follow-up with her PCP, Dr. Legrand Rams for medical management as needed.  Follow-up as above.  Jenne Campus, MD 01/23/2019, 10:45 AM

## 2019-01-23 NOTE — Progress Notes (Signed)
D.  Pt pleasant on approach, no complaints voiced.  Pt was dizzy this morning, but states she has had no further issues with this.  Pt was positive for evening wrap up group, has been appropriately engaged with peers on the unit.  Pt has a Depakote level and Lithium level with morning lab draws today.  Pt denies SI/HI/AVH at this time.  A.  Support and encouragement offered, medication given as ordered  R.  Pt remains safe on the unit, will continue to monitor.

## 2019-01-31 NOTE — Progress Notes (Deleted)
Primary Care Physician: Rosita Fire, MD  Primary Gastroenterologist:    No chief complaint on file.   HPI: Karen Keller is a 63 y.o. female here for follow of HCV, genotype 3, F1 fibrosis. Completed 12 weeks of Epclusa. Due for 3 months post-treatment HCV RNA.   Current Outpatient Medications  Medication Sig Dispense Refill  . albuterol (PROAIR HFA) 108 (90 Base) MCG/ACT inhaler Inhale 2 puffs into the lungs 4 (four) times daily as needed for wheezing or shortness of breath. (Patient taking differently: Inhale 2 puffs into the lungs as needed for wheezing or shortness of breath. )    . calcium-vitamin D (OSCAL WITH D) 500-200 MG-UNIT tablet Take 1 tablet by mouth 2 (two) times daily.    . divalproex (DEPAKOTE SPRINKLE) 125 MG capsule Take 4 capsules (500 mg total) by mouth 2 (two) times daily. 240 capsule 0  . docusate sodium (COLACE) 100 MG capsule Take 1 capsule (100 mg total) by mouth daily. (May buy from over the counter): For constipation 10 capsule 0  . famotidine (PEPCID) 40 MG tablet Take 1 tablet (40 mg total) by mouth 2 (two) times daily as needed for heartburn or indigestion. 60 tablet 5  . haloperidol (HALDOL) 2 MG tablet Take 1 tablet (2 mg total) by mouth at bedtime. 30 tablet 0  . hydrALAZINE (APRESOLINE) 25 MG tablet Take 25 mg by mouth 4 (four) times daily.    . hydrOXYzine (ATARAX/VISTARIL) 10 MG tablet Take 1 tablet (10 mg total) by mouth 2 (two) times daily as needed for anxiety. 60 tablet 0  . lidocaine (LIDODERM) 5 % Place 1 patch onto the skin daily. Remove & Discard patch within 12 hours or as directed by MD 30 patch 0  . LINZESS 290 MCG CAPS capsule TAKE 1 CAPSULE BY MOUTH EACH MORNING BEFORE BREAKFAST. (Patient taking differently: Take 290 mcg by mouth daily before breakfast. ) 30 capsule 11  . lithium carbonate 150 MG capsule Take 3 capsules (450 mg total) by mouth at bedtime. 90 capsule 0  . lithium carbonate 300 MG capsule Take 1 capsule (300 mg  total) by mouth every morning. 30 capsule 0  . LORazepam (ATIVAN) 0.5 MG tablet Take 0.25 mg by mouth every 12 (twelve) hours as needed for anxiety.     . Multiple Vitamin (MULTIVITAMIN WITH MINERALS) TABS tablet Take 1 tablet by mouth daily.    . naproxen (NAPROSYN) 500 MG tablet Take 500 mg by mouth 2 (two) times daily as needed for mild pain or moderate pain.     Marland Kitchen omeprazole (PRILOSEC) 40 MG capsule Take 1 capsule (40 mg total) by mouth daily before breakfast. 30 capsule 11  . oxybutynin (DITROPAN-XL) 10 MG 24 hr tablet Take 1 tablet (10 mg total) by mouth daily. For bladder spasms    . PARoxetine (PAXIL) 20 MG tablet Take 1 tablet (20 mg total) by mouth daily. 30 tablet 0  . senna (SENOKOT) 8.6 MG TABS tablet Take 1 tablet by mouth daily as needed for mild constipation.    Marland Kitchen tiotropium (SPIRIVA) 18 MCG inhalation capsule Place 18 mcg into inhaler and inhale daily.    . traMADol (ULTRAM) 50 MG tablet Take 50 mg by mouth 2 (two) times daily.     No current facility-administered medications for this visit.     Allergies as of 02/01/2019 - Review Complete 01/16/2019  Allergen Reaction Noted  . Neurontin [gabapentin] Other (See Comments) 12/20/2014  . Septra ds [sulfamethoxazole-trimethoprim] Other (  See Comments) 12/20/2014    ROS:  General: Negative for anorexia, weight loss, fever, chills, fatigue, weakness. ENT: Negative for hoarseness, difficulty swallowing , nasal congestion. CV: Negative for chest pain, angina, palpitations, dyspnea on exertion, peripheral edema.  Respiratory: Negative for dyspnea at rest, dyspnea on exertion, cough, sputum, wheezing.  GI: See history of present illness. GU:  Negative for dysuria, hematuria, urinary incontinence, urinary frequency, nocturnal urination.  Endo: Negative for unusual weight change.    Physical Examination:   There were no vitals taken for this visit.  General: Well-nourished, well-developed in no acute distress.  Eyes: No  icterus. Mouth: Oropharyngeal mucosa moist and pink , no lesions erythema or exudate. Lungs: Clear to auscultation bilaterally.  Heart: Regular rate and rhythm, no murmurs rubs or gallops.  Abdomen: Bowel sounds are normal, nontender, nondistended, no hepatosplenomegaly or masses, no abdominal bruits or hernia , no rebound or guarding.   Extremities: No lower extremity edema. No clubbing or deformities. Neuro: Alert and oriented x 4   Skin: Warm and dry, no jaundice.   Psych: Alert and cooperative, normal mood and affect.  Labs:  Lab Results  Component Value Date   CREATININE 0.90 01/15/2019   BUN 19 01/15/2019   NA 138 01/15/2019   K 3.8 01/15/2019   CL 107 01/15/2019   CO2 24 01/15/2019   Lab Results  Component Value Date   ALT 14 01/15/2019   AST 19 01/15/2019   ALKPHOS 99 01/15/2019   BILITOT 0.6 01/15/2019   Lab Results  Component Value Date   TSH 4.645 (H) 01/16/2019   Lab Results  Component Value Date   HGBA1C 5.1 01/16/2019     Lab Results  Component Value Date   WBC 8.4 01/15/2019   HGB 11.8 (L) 01/15/2019   HCT 36.7 01/15/2019   MCV 105.2 (H) 01/15/2019   PLT 212 01/15/2019    Imaging Studies: No results found.

## 2019-02-01 ENCOUNTER — Ambulatory Visit: Payer: Medicare Other | Admitting: Gastroenterology

## 2019-02-07 ENCOUNTER — Telehealth: Payer: Self-pay

## 2019-02-07 DIAGNOSIS — K7460 Unspecified cirrhosis of liver: Secondary | ICD-10-CM | POA: Diagnosis not present

## 2019-02-07 DIAGNOSIS — Z8619 Personal history of other infectious and parasitic diseases: Secondary | ICD-10-CM | POA: Diagnosis not present

## 2019-02-07 NOTE — Telephone Encounter (Signed)
T/C from [place] at [facility] stating the pt is there for labs that were ordered 12/14/2018. A couple of the labs were put in STAT but she will not do them STAT since they were ordered 2 months ago. Pt had a recent CBC at [facility] and I told her OK not to do the CBC. She will do the CMP, PT/INR and Hep C RNA Quanitative.  Forwarding FYI to Neil Crouch, PA who ordered the labs.

## 2019-02-08 NOTE — Telephone Encounter (Signed)
Noted  

## 2019-02-13 LAB — COMPREHENSIVE METABOLIC PANEL
AG Ratio: 1.6 (calc) (ref 1.0–2.5)
ALT: 14 U/L (ref 6–29)
AST: 20 U/L (ref 10–35)
Albumin: 4.3 g/dL (ref 3.6–5.1)
Alkaline phosphatase (APISO): 121 U/L (ref 37–153)
BUN/Creatinine Ratio: 13 (calc) (ref 6–22)
BUN: 15 mg/dL (ref 7–25)
CO2: 26 mmol/L (ref 20–32)
Calcium: 10.1 mg/dL (ref 8.6–10.4)
Chloride: 107 mmol/L (ref 98–110)
Creat: 1.12 mg/dL — ABNORMAL HIGH (ref 0.50–0.99)
Globulin: 2.7 g/dL (calc) (ref 1.9–3.7)
Glucose, Bld: 93 mg/dL (ref 65–139)
Potassium: 4.2 mmol/L (ref 3.5–5.3)
Sodium: 141 mmol/L (ref 135–146)
Total Bilirubin: 0.5 mg/dL (ref 0.2–1.2)
Total Protein: 7 g/dL (ref 6.1–8.1)

## 2019-02-13 LAB — HEPATITIS C RNA QUANTITATIVE
HCV Quantitative Log: <1.18 Log IU/mL
HCV RNA, PCR, QN: <15 IU/mL

## 2019-02-13 LAB — PROTIME-INR
INR: 1
Prothrombin Time: 10.4 s (ref 9.0–11.5)

## 2019-02-14 ENCOUNTER — Encounter: Payer: Self-pay | Admitting: Gastroenterology

## 2019-02-14 ENCOUNTER — Encounter: Payer: Self-pay | Admitting: Internal Medicine

## 2019-02-14 ENCOUNTER — Ambulatory Visit (INDEPENDENT_AMBULATORY_CARE_PROVIDER_SITE_OTHER): Payer: Medicare Other | Admitting: Gastroenterology

## 2019-02-14 DIAGNOSIS — K219 Gastro-esophageal reflux disease without esophagitis: Secondary | ICD-10-CM

## 2019-02-14 DIAGNOSIS — Z8619 Personal history of other infectious and parasitic diseases: Secondary | ICD-10-CM | POA: Diagnosis not present

## 2019-02-14 DIAGNOSIS — K5900 Constipation, unspecified: Secondary | ICD-10-CM

## 2019-02-14 NOTE — Patient Instructions (Signed)
1. Continue omeprazole and Linzess as before.  2. You have been cured from Hepatitis C based on recent labs. You can get Hepatitis C again if you come in contact with Hepatitis C through sharing dirty needles, razors, nailclippers, straws for illicit drug use.  3. Return to the office in six months for follow up of constipation and acid reflux. 4. See your family doctor for urinary complaints, numerous episodes of urination at night.
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
## 2019-02-24 ENCOUNTER — Other Ambulatory Visit: Payer: Self-pay

## 2019-02-24 ENCOUNTER — Other Ambulatory Visit (HOSPITAL_COMMUNITY): Payer: Self-pay | Admitting: Internal Medicine

## 2019-02-24 ENCOUNTER — Ambulatory Visit (HOSPITAL_COMMUNITY)
Admission: RE | Admit: 2019-02-24 | Discharge: 2019-02-24 | Disposition: A | Discharge status: Home / Self Care | Payer: Medicare Other | Source: Ambulatory Visit | Attending: Internal Medicine | Admitting: Internal Medicine

## 2019-02-24 DIAGNOSIS — J189 Pneumonia, unspecified organism: Secondary | ICD-10-CM

## 2019-03-15 ENCOUNTER — Encounter (HOSPITAL_COMMUNITY): Payer: Self-pay | Admitting: Emergency Medicine

## 2019-03-15 ENCOUNTER — Emergency Department (HOSPITAL_COMMUNITY)
Admission: EM | Admit: 2019-03-15 | Discharge: 2019-03-16 | Disposition: A | Discharge status: Other Acute Inpatient Hospital | Payer: Medicare HMO | Attending: Emergency Medicine | Admitting: Emergency Medicine

## 2019-03-15 ENCOUNTER — Other Ambulatory Visit: Payer: Self-pay

## 2019-03-15 DIAGNOSIS — Z03818 Encounter for observation for suspected exposure to other biological agents ruled out: Secondary | ICD-10-CM | POA: Diagnosis not present

## 2019-03-15 DIAGNOSIS — J449 Chronic obstructive pulmonary disease, unspecified: Secondary | ICD-10-CM | POA: Diagnosis not present

## 2019-03-15 DIAGNOSIS — J45909 Unspecified asthma, uncomplicated: Secondary | ICD-10-CM | POA: Diagnosis not present

## 2019-03-15 DIAGNOSIS — Z20822 Contact with and (suspected) exposure to covid-19: Secondary | ICD-10-CM | POA: Insufficient documentation

## 2019-03-15 DIAGNOSIS — F17210 Nicotine dependence, cigarettes, uncomplicated: Secondary | ICD-10-CM | POA: Insufficient documentation

## 2019-03-15 DIAGNOSIS — R45851 Suicidal ideations: Secondary | ICD-10-CM | POA: Insufficient documentation

## 2019-03-15 DIAGNOSIS — F250 Schizoaffective disorder, bipolar type: Secondary | ICD-10-CM | POA: Insufficient documentation

## 2019-03-15 DIAGNOSIS — I10 Essential (primary) hypertension: Secondary | ICD-10-CM | POA: Diagnosis not present

## 2019-03-15 DIAGNOSIS — Z79899 Other long term (current) drug therapy: Secondary | ICD-10-CM | POA: Diagnosis not present

## 2019-03-15 LAB — CBC WITH DIFFERENTIAL/PLATELET
Abs Immature Granulocytes: 0.09 10*3/uL — ABNORMAL HIGH (ref 0.00–0.07)
Basophils Absolute: 0.1 10*3/uL (ref 0.0–0.1)
Basophils Relative: 1 %
Eosinophils Absolute: 0.2 10*3/uL (ref 0.0–0.5)
Eosinophils Relative: 3 %
HCT: 39.9 % (ref 36.0–46.0)
Hemoglobin: 12.5 g/dL (ref 12.0–15.0)
Immature Granulocytes: 1 %
Lymphocytes Relative: 32 %
Lymphs Abs: 2.6 10*3/uL (ref 0.7–4.0)
MCH: 33.2 pg (ref 26.0–34.0)
MCHC: 31.3 g/dL (ref 30.0–36.0)
MCV: 105.8 fL — ABNORMAL HIGH (ref 80.0–100.0)
Monocytes Absolute: 0.8 10*3/uL (ref 0.1–1.0)
Monocytes Relative: 10 %
Neutro Abs: 4.3 10*3/uL (ref 1.7–7.7)
Neutrophils Relative %: 53 %
Platelets: 263 10*3/uL (ref 150–400)
RBC: 3.77 MIL/uL — ABNORMAL LOW (ref 3.87–5.11)
RDW: 14 % (ref 11.5–15.5)
WBC: 8.1 10*3/uL (ref 4.0–10.5)
nRBC: 0 % (ref 0.0–0.2)

## 2019-03-15 LAB — ETHANOL: Alcohol, Ethyl (B): <10 mg/dL (ref <10)

## 2019-03-15 LAB — COMPREHENSIVE METABOLIC PANEL
ALT: 16 U/L (ref 0–44)
AST: 22 U/L (ref 15–41)
Albumin: 4.1 g/dL (ref 3.5–5.0)
Alkaline Phosphatase: 127 U/L — ABNORMAL HIGH (ref 38–126)
Anion gap: 9 (ref 5–15)
BUN: 22 mg/dL (ref 8–23)
CO2: 25 mmol/L (ref 22–32)
Calcium: 9.8 mg/dL (ref 8.9–10.3)
Chloride: 102 mmol/L (ref 98–111)
Creatinine, Ser: 1.15 mg/dL — ABNORMAL HIGH (ref 0.44–1.00)
GFR calc Af Amer: 58 mL/min — ABNORMAL LOW (ref >60)
GFR calc non Af Amer: 50 mL/min — ABNORMAL LOW (ref >60)
Glucose, Bld: 94 mg/dL (ref 70–99)
Potassium: 3.6 mmol/L (ref 3.5–5.1)
Sodium: 136 mmol/L (ref 135–145)
Total Bilirubin: 0.6 mg/dL (ref 0.3–1.2)
Total Protein: 7.8 g/dL (ref 6.5–8.1)

## 2019-03-15 LAB — RAPID URINE DRUG SCREEN, HOSP PERFORMED
Amphetamines: NOT DETECTED
Barbiturates: NOT DETECTED
Benzodiazepines: NOT DETECTED
Cocaine: NOT DETECTED
Opiates: NOT DETECTED
Tetrahydrocannabinol: NOT DETECTED

## 2019-03-15 LAB — RESPIRATORY PANEL BY RT PCR (FLU A&B, COVID)
Influenza A by PCR: NEGATIVE
Influenza B by PCR: NEGATIVE
SARS Coronavirus 2 by RT PCR: NEGATIVE

## 2019-03-15 LAB — LITHIUM LEVEL: Lithium Lvl: 0.9 mmol/L (ref 0.60–1.20)

## 2019-03-15 MED ORDER — LORAZEPAM 0.5 MG PO TABS: 0.2500 mg | ORAL_TABLET | Freq: Two times a day (BID) | ORAL | Status: DC | PRN

## 2019-03-15 MED ORDER — HALOPERIDOL 2 MG PO TABS
2.0000 mg | ORAL_TABLET | Freq: Every day | ORAL | Status: DC
  Administered 2019-03-15: 2 mg via ORAL
  Filled 2019-03-15 (×2): qty 1

## 2019-03-15 MED ORDER — LITHIUM CARBONATE 300 MG PO CAPS: 300.0000 mg | ORAL_CAPSULE | ORAL | Status: DC

## 2019-03-15 MED ORDER — HYDROXYZINE HCL 10 MG PO TABS
10.0000 mg | ORAL_TABLET | Freq: Two times a day (BID) | ORAL | Status: DC | PRN
  Filled 2019-03-15: qty 1

## 2019-03-15 MED ORDER — UMECLIDINIUM BROMIDE 62.5 MCG/INH IN AEPB: 1.0000 | INHALATION_SPRAY | Freq: Every day | RESPIRATORY_TRACT | Status: DC

## 2019-03-15 MED ORDER — TRAMADOL HCL 50 MG PO TABS
50.0000 mg | ORAL_TABLET | Freq: Two times a day (BID) | ORAL | Status: DC
  Administered 2019-03-15: 50 mg via ORAL
  Filled 2019-03-15: qty 1

## 2019-03-15 MED ORDER — OLANZAPINE 5 MG PO TABS
5.0000 mg | ORAL_TABLET | Freq: Every day | ORAL | Status: DC
  Administered 2019-03-15: 5 mg via ORAL
  Filled 2019-03-15: qty 1

## 2019-03-15 MED ORDER — PANTOPRAZOLE SODIUM 40 MG PO TBEC
40.0000 mg | DELAYED_RELEASE_TABLET | Freq: Every day | ORAL | Status: DC
  Administered 2019-03-15: 40 mg via ORAL

## 2019-03-15 MED ORDER — HYDRALAZINE HCL 25 MG PO TABS
25.0000 mg | ORAL_TABLET | Freq: Four times a day (QID) | ORAL | Status: DC
  Administered 2019-03-15: 25 mg via ORAL
  Filled 2019-03-15: qty 1

## 2019-03-15 MED ORDER — DIVALPROEX SODIUM 125 MG PO CSDR
500.0000 mg | DELAYED_RELEASE_CAPSULE | Freq: Two times a day (BID) | ORAL | Status: DC
  Administered 2019-03-15: 500 mg via ORAL
  Filled 2019-03-15 (×4): qty 4

## 2019-03-15 MED ORDER — OXYBUTYNIN CHLORIDE ER 5 MG PO TB24
10.0000 mg | ORAL_TABLET | Freq: Every day | ORAL | Status: DC
  Administered 2019-03-15: 10 mg via ORAL
  Filled 2019-03-15 (×4): qty 2

## 2019-03-15 MED ORDER — PAROXETINE HCL 20 MG PO TABS
20.0000 mg | ORAL_TABLET | Freq: Every day | ORAL | Status: DC
  Administered 2019-03-15: 20 mg via ORAL
  Filled 2019-03-15 (×4): qty 1

## 2019-03-15 MED ORDER — LITHIUM CARBONATE 300 MG PO CAPS
450.0000 mg | ORAL_CAPSULE | Freq: Every day | ORAL | Status: DC
  Administered 2019-03-15: 450 mg via ORAL
  Filled 2019-03-15 (×2): qty 1

## 2019-03-15 MED ORDER — HALOPERIDOL 2 MG PO TABS
ORAL_TABLET | ORAL | Status: AC
  Filled 2019-03-15: qty 1

## 2019-03-15 MED ORDER — DOCUSATE SODIUM 100 MG PO CAPS
100.0000 mg | ORAL_CAPSULE | Freq: Every day | ORAL | Status: DC
  Administered 2019-03-15: 100 mg via ORAL
  Filled 2019-03-15: qty 1

## 2019-03-15 MED ORDER — HYDROXYZINE HCL 25 MG PO TABS
25.0000 mg | ORAL_TABLET | Freq: Four times a day (QID) | ORAL | Status: DC | PRN
  Administered 2019-03-15: 25 mg via ORAL
  Filled 2019-03-15: qty 1

## 2019-03-15 MED ORDER — LINACLOTIDE 145 MCG PO CAPS
290.0000 ug | ORAL_CAPSULE | Freq: Every day | ORAL | Status: DC
  Filled 2019-03-15 (×2): qty 2

## 2019-03-15 NOTE — ED Notes (Signed)
Pt's left arm dressed with bacitracin and kerlex

## 2019-03-15 NOTE — ED Notes (Signed)
Pt given meal tray.

## 2019-03-15 NOTE — ED Triage Notes (Signed)
Pt states that she can not think about anything but killing herself she states that she feels hopeless. She has cut her arm
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
## 2019-03-15 NOTE — ED Notes (Signed)
TTS in progress 

## 2019-03-15 NOTE — BH Assessment (Signed)
[place] Assessment Progress Note   Per Nash Mantis, [facility] at [facility], patient can come to [facility] pending a negative COVID test.  Bed assignment once test results.

## 2019-03-16 ENCOUNTER — Inpatient Hospital Stay (HOSPITAL_COMMUNITY)
Admission: AD | Admit: 2019-03-16 | Discharge: 2019-03-20 | DRG: 885 | Disposition: A | Discharge status: Home / Self Care | Payer: Medicare HMO | Source: Intra-hospital | Attending: Psychiatry | Admitting: Psychiatry

## 2019-03-16 ENCOUNTER — Other Ambulatory Visit: Payer: Self-pay

## 2019-03-16 ENCOUNTER — Encounter (HOSPITAL_COMMUNITY): Payer: Self-pay | Admitting: Psychiatry

## 2019-03-16 DIAGNOSIS — Z79899 Other long term (current) drug therapy: Secondary | ICD-10-CM | POA: Diagnosis not present

## 2019-03-16 DIAGNOSIS — J449 Chronic obstructive pulmonary disease, unspecified: Secondary | ICD-10-CM | POA: Diagnosis present

## 2019-03-16 DIAGNOSIS — G4700 Insomnia, unspecified: Secondary | ICD-10-CM | POA: Diagnosis present

## 2019-03-16 DIAGNOSIS — F250 Schizoaffective disorder, bipolar type: Secondary | ICD-10-CM | POA: Diagnosis not present

## 2019-03-16 DIAGNOSIS — F3160 Bipolar disorder, current episode mixed, unspecified: Secondary | ICD-10-CM | POA: Diagnosis present

## 2019-03-16 DIAGNOSIS — F259 Schizoaffective disorder, unspecified: Secondary | ICD-10-CM | POA: Diagnosis present

## 2019-03-16 DIAGNOSIS — F17210 Nicotine dependence, cigarettes, uncomplicated: Secondary | ICD-10-CM | POA: Diagnosis present

## 2019-03-16 DIAGNOSIS — I10 Essential (primary) hypertension: Secondary | ICD-10-CM | POA: Diagnosis present

## 2019-03-16 DIAGNOSIS — R45851 Suicidal ideations: Secondary | ICD-10-CM | POA: Diagnosis present

## 2019-03-16 DIAGNOSIS — K5900 Constipation, unspecified: Secondary | ICD-10-CM | POA: Diagnosis not present

## 2019-03-16 DIAGNOSIS — R072 Precordial pain: Secondary | ICD-10-CM | POA: Diagnosis not present

## 2019-03-16 DIAGNOSIS — Z888 Allergy status to other drugs, medicaments and biological substances status: Secondary | ICD-10-CM | POA: Diagnosis not present

## 2019-03-16 DIAGNOSIS — Z8249 Family history of ischemic heart disease and other diseases of the circulatory system: Secondary | ICD-10-CM | POA: Diagnosis not present

## 2019-03-16 DIAGNOSIS — F419 Anxiety disorder, unspecified: Secondary | ICD-10-CM | POA: Diagnosis not present

## 2019-03-16 DIAGNOSIS — K219 Gastro-esophageal reflux disease without esophagitis: Secondary | ICD-10-CM | POA: Diagnosis present

## 2019-03-16 MED ORDER — PAROXETINE HCL 10 MG PO TABS
10.0000 mg | ORAL_TABLET | Freq: Every day | ORAL | Status: DC
  Administered 2019-03-17 – 2019-03-20 (×4): 10 mg via ORAL
  Filled 2019-03-16 (×7): qty 1

## 2019-03-16 MED ORDER — MAGNESIUM HYDROXIDE 400 MG/5ML PO SUSP: 30.0000 mL | Freq: Every day | ORAL | Status: DC | PRN

## 2019-03-16 MED ORDER — HYDRALAZINE HCL 25 MG PO TABS
25.0000 mg | ORAL_TABLET | Freq: Three times a day (TID) | ORAL | Status: DC
  Administered 2019-03-16 – 2019-03-17 (×4): 25 mg via ORAL
  Filled 2019-03-16 (×12): qty 1

## 2019-03-16 MED ORDER — BENZOCAINE 10 % MT GEL
Freq: Four times a day (QID) | OROMUCOSAL | Status: DC | PRN
  Administered 2019-03-16: 1 via OROMUCOSAL
  Filled 2019-03-16: qty 9

## 2019-03-16 MED ORDER — ALBUTEROL SULFATE HFA 108 (90 BASE) MCG/ACT IN AERS
1.0000 | INHALATION_SPRAY | Freq: Four times a day (QID) | RESPIRATORY_TRACT | Status: DC | PRN
  Administered 2019-03-17 – 2019-03-19 (×2): 1 via RESPIRATORY_TRACT
  Filled 2019-03-16: qty 6.7

## 2019-03-16 MED ORDER — ACETAMINOPHEN 325 MG PO TABS
650.0000 mg | ORAL_TABLET | Freq: Four times a day (QID) | ORAL | Status: DC | PRN
  Administered 2019-03-16 – 2019-03-20 (×9): 650 mg via ORAL
  Filled 2019-03-16 (×10): qty 2

## 2019-03-16 MED ORDER — LORAZEPAM 0.5 MG PO TABS: 0.5000 mg | ORAL_TABLET | Freq: Three times a day (TID) | ORAL | Status: DC | PRN

## 2019-03-16 MED ORDER — ALUM & MAG HYDROXIDE-SIMETH 200-200-20 MG/5ML PO SUSP
30.0000 mL | ORAL | Status: DC | PRN
  Administered 2019-03-18: 30 mL via ORAL

## 2019-03-16 MED ORDER — LITHIUM CARBONATE 300 MG PO CAPS
300.0000 mg | ORAL_CAPSULE | Freq: Two times a day (BID) | ORAL | Status: DC
  Administered 2019-03-16 – 2019-03-18 (×4): 300 mg via ORAL
  Filled 2019-03-16 (×9): qty 1

## 2019-03-16 MED ORDER — DIVALPROEX SODIUM ER 250 MG PO TB24
250.0000 mg | ORAL_TABLET | Freq: Two times a day (BID) | ORAL | Status: DC
  Administered 2019-03-16 – 2019-03-17 (×2): 250 mg via ORAL
  Filled 2019-03-16 (×7): qty 1

## 2019-03-16 MED ORDER — HYDROXYZINE HCL 25 MG PO TABS: 25.0000 mg | ORAL_TABLET | Freq: Three times a day (TID) | ORAL | Status: DC | PRN

## 2019-03-16 MED ORDER — TRAZODONE HCL 50 MG PO TABS: 50.0000 mg | ORAL_TABLET | Freq: Every evening | ORAL | Status: DC | PRN

## 2019-03-16 MED ORDER — HALOPERIDOL 2 MG PO TABS
2.0000 mg | ORAL_TABLET | Freq: Two times a day (BID) | ORAL | Status: DC
  Administered 2019-03-16 – 2019-03-20 (×8): 2 mg via ORAL
  Filled 2019-03-16 (×13): qty 1

## 2019-03-16 NOTE — ED Notes (Signed)
Spoke with [facility] at [facility], states patient can come to bed [id number]

## 2019-03-16 NOTE — Tx Team (Signed)
Initial Treatment Plan [id number] AM [name] AB:[id number]    PATIENT STRESSORS: Financial difficulties Medication change or noncompliance   PATIENT STRENGTHS: Average or above average intelligence Communication skills General fund of knowledge Motivation for treatment/growth   PATIENT IDENTIFIED PROBLEMS:   depression  anxiety  Suicide ideation  visual hallucination  "I want to get my medication right"  "I don't want to hurt myself"         DISCHARGE CRITERIA:  Improved stabilization in mood, thinking, and/or behavior Medical problems require only outpatient monitoring Motivation to continue treatment in a less acute level of care Need for constant or close observation no longer present  PRELIMINARY DISCHARGE PLAN: Attend 12-step recovery group Placement in alternative living arrangements Return to previous living arrangement  PATIENT/FAMILY INVOLVEMENT: This treatment plan has been presented to and reviewed with the patient, Karen Keller,   The patient and family have been given the opportunity to ask questions and make suggestions.  Mal Misty, RN 03/16/2019, 5:44 AM

## 2019-03-16 NOTE — [facility] Suicide Risk Assessment (Signed)
[facility] Admission Suicide Risk Assessment   Nursing information obtained from:  Patient Demographic factors:  Age 64 or older, Unemployed, Caucasian Current Mental Status:  Self-harm behaviors Loss Factors:  NA Historical Factors:  Impulsivity Risk Reduction Factors:  Positive therapeutic relationship  Total Time spent with patient: 45 minutes Principal Problem: Schizoaffective disorder ([place]) Diagnosis:  Principal Problem:   Schizoaffective disorder ([place]) Active Problems:   Schizoaffective disorder, bipolar type ([place])  Subjective Data:   Continued Clinical Symptoms:  Alcohol Use Disorder Identification Test Final Score (AUDIT): 0 The "Alcohol Use Disorders Identification Test", Guidelines for Use in Primary Care, Second Edition.  World Pharmacologist [facility]). Score between 0-7:  no or low risk or alcohol related problems. Score between 8-15:  moderate risk of alcohol related problems. Score between 16-19:  high risk of alcohol related problems. Score 20 or above:  warrants further diagnostic evaluation for alcohol dependence and treatment.   CLINICAL FACTORS:  64 year old female, single, no children, lives in [place] ( [place] ) , known to our unit from prior psychiatric admission, most recently in November 2020, at which time presented due to depression and suicidal ideations. At the time was diagnosed with Schizoaffective Disorder and was discharged on Depakote, Haloperidol, Vistaril, Lithium, Ativan PRN, Paxil.  Presented to ED voluntarily due to worsening depression with worsening suicidal ideations over the last week or so. She cut self superficially on forearm yesterday ( has superficial scratch/cut on L  forearm) . She also intermittent visual hallucinations , which she describes as " seeing a dog " or seeing " a piece of meat on the floor", most recently 2-3 days ago.   Endorses mild anhedonia, low energy level, describes appetite and energy level as  normal. She does not identify any specific trigger or exacerbating factor.  Reports history of chronic mental illness, and reports mood/psychotic symptoms started in the 1990's. History of several prior psychiatric admissions, has been diagnosed with Bipolar Disorder/Schizoaffective Disorder in the past . Denies alcohol or drug abuse history Home medications as per chart - albuterol inhaler, Depakote 500 mgrs BID, Haldol 2 mgr QHS,Vistaril 10 mgrs PRN for anxiety, Lithium 300 mgrs QAM and 450 mgrs QHS, Zyprexa 5 mgrs QHS, Paxil 20 mgrs QHS. Patient reports she knows she is on Depakote , Li, Haldol, Paxil, but is unsure about Zyprexa . States she prefers Haldol, which she has tolerated well .  Patient denies medical illnesses- Chart notes indicate history of COPD and HTN. Reports allergy to Neurontin and Sulfamethoxazole. * Li level 0.9 on 1/6.  Dx- Schizoaffective Disorder by history  Plan - inpatient admission. Resume Lithium at 300 mgrs BID , Depakote ER at 250 mgrs BID, Paxil at 10 mgrs QHS initially. Will increase Haloperidol from 2 mgrs QHS to 2 mgrs BID. For now will not restart Zyprexa and attempt to manage with one antipsychotic medication ( Haloperidol) Continue Vistaril PRN for anxiety. Will resume Hydralazine at TID ( BP today 131/65) Check  TSH and Valproic Acid Serum level .  Has unremarkable Lipid Panel and HgbA1C from 01/2019 so will not order these tests at present. Check EKG to monitor QTc     Musculoskeletal: Strength & Muscle Tone: within normal limits Gait & Station: normal Patient leans: N/A  Psychiatric Specialty Exam: Physical Exam  Review of Systems denies headache, no chest pain, no shortness of breath, reports chronic cough which she states " is a smoker's cough", no vomiting   Blood pressure 131/65, pulse Marland Kitchen)  59, temperature 98.8 F (37.1 C), temperature source Oral, resp. rate 16, height 5' 4.17" (1.63 m), weight 80.7 kg, SpO2 96 %.Body mass index is 30.39 kg/m.   General Appearance: Fairly Groomed  Eye Contact:  Good  Speech:  Normal Rate  Volume:  Increased  Mood:  Depressed  Affect:  vaguely constricted   Thought Process:  Linear and Descriptions of Associations: Intact  Orientation:  Other:  fully alert and attentive   Thought Content:  no hallucinations, no delusions   Suicidal Thoughts:  No denies suicidal or self injurious ideations, denies homicidal or violent ideations , currently contracts for safety on unit  Homicidal Thoughts:  No  Memory:  recent and remote grossly intact   Judgement:  Fair  Insight:  Fair  Psychomotor Activity:  Normal- no current psychomotor agitation or restlessness   Concentration:  Concentration: Good and Attention Span: Good  Recall:  Good  Fund of Knowledge:  Good  Language:  Good  Akathisia:  Negative  Handed:  Right  AIMS (if indicated):     Assets:  Communication Skills Desire for Improvement Resilience  ADL's:  Intact  Cognition:  WNL  Sleep:  Number of Hours: 0.5      COGNITIVE FEATURES THAT CONTRIBUTE TO RISK:  Closed-mindedness and Loss of executive function    SUICIDE RISK:   Moderate:  Frequent suicidal ideation with limited intensity, and duration, some specificity in terms of plans, no associated intent, good self-control, limited dysphoria/symptomatology, some risk factors present, and identifiable protective factors, including available and accessible social support.  PLAN OF CARE: Patient will be admitted to inpatient psychiatric unit for stabilization and safety. Will provide and encourage milieu participation. Provide medication management and maked adjustments as needed.  Will follow daily.    I certify that inpatient services furnished can reasonably be expected to improve the patient's condition.   Jenne Campus, MD 03/16/2019, 2:22 PM
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
## 2019-03-16 NOTE — Progress Notes (Signed)
   03/16/19 2016  Psych Admission Type (Psych Patients Only)  Admission Status Voluntary  Psychosocial Assessment  Patient Complaints Depression  Eye Contact Fair  Facial Expression Flat  Affect Flat  Speech Logical/coherent  Interaction Assertive  Motor Activity Slow  Appearance/Hygiene Unremarkable  Behavior Characteristics Cooperative  Mood Depressed  Aggressive Behavior  Effect No apparent injury  Thought Process  Coherency WDL  Content UTA  Delusions None reported or observed  Hallucination None reported or observed  Judgment Impaired  Confusion None  Danger to Self  Current suicidal ideation? Denies  Self-Injurious Behavior No self-injurious ideation or behavior indicators observed or expressed   Agreement Not to Harm Self Yes  Description of Agreement verbal  Danger to Others  Danger to Others None reported or observed   Pt flat affect. Denies SI, HI, AVH. C/o pain in tooth and given Orajel. Pt contracts for safety.

## 2019-03-16 NOTE — ED Notes (Signed)
Pt dressed out  In purple scrubs, and wanded by security

## 2019-03-16 NOTE — ED Notes (Signed)
Pt changing into scrubs

## 2019-03-16 NOTE — ED Notes (Signed)
Report given to [facility] at [facility]

## 2019-03-16 NOTE — Progress Notes (Signed)
    03/16/19 1200  Psych Admission Type (Psych Patients Only)  Admission Status Voluntary  Psychosocial Assessment  Patient Complaints Depression  Eye Contact Fair  Facial Expression Flat  Affect Flat  Speech Logical/coherent  Interaction Assertive  Motor Activity Slow  Appearance/Hygiene Improved  Behavior Characteristics Cooperative  Mood Depressed  Aggressive Behavior  Effect No apparent injury  Thought Process  Coherency WDL  Content Blaming self  Delusions None reported or observed  Perception WDL  Hallucination None reported or observed  Judgment Impaired  Confusion None  Danger to Self  Current suicidal ideation? Denies  Agreement Not to Harm Self Yes  Description of Agreement verbal contract for safety  Danger to Others  Danger to Others None reported or observed

## 2019-03-16 NOTE — ED Notes (Signed)
Pt wanded by security. 

## 2019-03-16 NOTE — Progress Notes (Signed)
Patient ID: Karen Keller, female   DOB: [birth year], 64 y.o.   MRN: [id number] Admission note: D:Patient is a  Voluntary  admission in no acute distress for depression and SI and superficial  Left forearm cut. Pt reports she has been obsessing about killing herself. Pt report she tried to look for a butcher knife but did find one so she used a scissors to cut her left forearm.  Pt receives medication from from [employer].  Pt is endorsing past suicidal attempt by cutting and overdose.  Pt admitted to unit per protocol, skin assessment and belonging search done.  Consent signed by pt. Pt educated on therapeutic milieu rules. Pt was introduced to milieu by nursing staff. Fall risk / suicide safety plan explained to the patient. 15 minutes checks started for safety.

## 2019-03-16 NOTE — BHH Counselor (Signed)
Adult Comprehensive Assessment  Patient ID: Karen Keller, female   DOB: September 29, 1955, 64 y.o.   MRN: [id number]   Information Source: Information source: Patient  Current Stressors: Patient states their primary concerns and needs for treatment are:"I am just suicidal. It is my depression" Patient states their goals for this hospitilization and ongoing recovery are:: "Get rid of my thoughts. To get out of my head"  Educational / Learning stressors: N/A  Employment / Job issues: On disability  Family Relationships: Denies any current stressors  Financial / Lack of resources (include bankruptcy): SSDI; Limited income Housing / Lack of housing: Lives at [facility]; Denies any current stressors  Physical health (include injuries & life threatening diseases): Denies any current stressors Social relationships: Reports having a lack of social relationships currently Substance abuse: Denies any current stressors Bereavement / Loss: Reports   Living/Environment/Situation: Living Arrangements: Group Home Living conditions (as described by patient or guardian):Unhappy, feels isolated. Who else lives in the home?:Other residents, has a roommate How long has patient lived in current situation?:1 year in current home, 4.5 years in the system What is atmosphere in current home:Stressful, lonely, tense  Family History: Marital status: Single Are you sexually active?: No What is your sexual orientation?: Heterosexual Has your sexual activity been affected by drugs, alcohol, medication, or emotional stress?: "No, I dont take alcohol or drugs" Does patient have children?: No  Childhood History: By whom was/is the patient raised?: Both parents Additional childhood history information: Very good childhood with the best schools as described by Karen Keller. Description of patient's relationship with caregiver when they were a child: "Very good, they are  deceased now" Patient's description of current relationship with people who raised him/her: None reported How were you disciplined when you got in trouble as a child/adolescent?: "When I was little my mom would wash my mouth out with soap" Does patient have siblings?: Yes Number of Siblings: 1 Description of patient's current relationship with siblings: Younger brother that she does not get along with Did patient suffer any verbal/emotional/physical/sexual abuse as a child?: No Did patient suffer from severe childhood neglect?: No Has patient ever been sexually abused/assaulted/raped as an adolescent or adult?: No Was the patient ever a victim of a crime or a disaster?: No Witnessed domestic violence?: No Has patient been effected by domestic violence as an adult?: No  Education: Highest grade of school patient has completed: BS in nursing [employer] Currently a student?: No Learning disability?: No  Employment/Work Situation: Employment situation: On disability Why is patient on disability: Bipolar with schizophrenic affect How long has patient been on disability: 40 Patient's job has been impacted by current illness: Yes Describe how patient's job has been impacted: "I couldn't handle the responsibility anymore" What is the longest time patient has a held a job?: 20 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 Are There Guns or Other Weapons in [place]?: No  Financial Resources: Museum/gallery curator resources: Teacher, early years/pre, Medicare  Alcohol/Substance Abuse: What has been your use of drugs/alcohol within the last 12 months?: See above None reported If attempted suicide, did drugs/alcohol play a role in this?: No Alcohol/Substance Abuse Treatment Hx: Denies past history Has alcohol/substance abuse ever caused legal problems?: No  Social Support System: Heritage manager System: None Describe Community Support System: None reported Type of faith/religion:  Lutheran How does patient's faith help to cope with current illness?: "It gives me hope"  Leisure/Recreation: Leisure and Hobbies: "I read, I smoke  cigarettes"  Strengths/Needs: What is the patient's perception of their strengths?: "I am organized, trustworthy, if I say I am going to do something I will do it" Patient states they can use these personal strengths during their treatment to contribute to their recovery: Yes Patient states these barriers may affect/interfere with their treatment: "I need to move forward" Patient states these barriers may affect their return to the community: "I dont want any friction with her, the owner, [name]" (Patient states that the owner is telling her that "I don't care if you slit your throat or cut your other writst, because you'll be going to hell, I wont") Other important information patient would like considered in planning for their treatment: [name] has told her she does not have enough money for another home  Discharge Plan: Currently receiving community mental health services: Yes (From Whom)([facility]) Patient states concerns and preferences for aftercare planning [id number] to continue following up with [facility], would like information about other group homes. Patient states they will know when they are safe and ready for discharge when:When she doesn't have the urge to hurt herself. Does patient have access to transportation?: Yes Does patient have financial barriers related to discharge medications?: No Patient description of barriers related to discharge medications: "She [name]) says Elberta Fortis pays for my meds"  Will patient be returning to same living situation after discharge?: Most likely  Summary/Recommendations:   Summary and Recommendations (to be completed by the evaluator): Karen Keller is a 64 year old female who is diagnosed with Schizoaffective disorder, bipolar type. She presented to the hospital seeking  treatment for suicidal ideation. During the assessment, Karen Keller was pleasant and cooperative with providing information. Karen Keller reports that she does not have any specific stressors, however she is experiencing worsening depression and suicidal ideation. Karen Keller reports that she does not have any issues at her residence, [facility] and that she plans to return at [facility]. Karen Keller also reports she continues to follow up with [facility] for medication management and therapy services. Karen Keller can benefit from crisis stabilization, medication management, therapeutic milieu, group therapy, psycho-education and referral services.  Karen Keller. 03/16/2019

## 2019-03-17 LAB — VALPROIC ACID LEVEL: Valproic Acid Lvl: 15 ug/mL — ABNORMAL LOW (ref 50.0–100.0)

## 2019-03-17 LAB — TSH: TSH: 4.325 u[IU]/mL (ref 0.350–4.500)

## 2019-03-17 MED ORDER — DIVALPROEX SODIUM ER 250 MG PO TB24
250.0000 mg | ORAL_TABLET | ORAL | Status: DC
  Administered 2019-03-18 – 2019-03-20 (×3): 250 mg via ORAL
  Filled 2019-03-17 (×5): qty 1

## 2019-03-17 MED ORDER — HYDRALAZINE HCL 25 MG PO TABS
25.0000 mg | ORAL_TABLET | Freq: Four times a day (QID) | ORAL | Status: DC
  Administered 2019-03-17 – 2019-03-20 (×11): 25 mg via ORAL
  Filled 2019-03-17 (×18): qty 1

## 2019-03-17 MED ORDER — DIVALPROEX SODIUM ER 500 MG PO TB24
500.0000 mg | ORAL_TABLET | Freq: Every day | ORAL | Status: DC
  Administered 2019-03-17 – 2019-03-19 (×3): 500 mg via ORAL
  Filled 2019-03-17 (×6): qty 1

## 2019-03-17 MED ORDER — IBUPROFEN 600 MG PO TABS
600.0000 mg | ORAL_TABLET | Freq: Four times a day (QID) | ORAL | Status: DC | PRN
  Administered 2019-03-17 – 2019-03-18 (×3): 600 mg via ORAL
  Filled 2019-03-17 (×3): qty 1

## 2019-03-17 NOTE — Progress Notes (Signed)
Recreation Therapy Notes  Date:  1.8.21 Time: 0930 Location: 300 Hall Dayroom  Group Topic: Stress Management  Goal Area(s) Addresses:  Patient will identify positive stress management techniques. Patient will identify benefits of using stress management post d/c.  Behavioral Response:  Engaged  Intervention: Stress Management  Activity :  Meditation.  LRT played a meditation that focused on choices.  Patients were to listen and follow along as meditation played to engage.  Education:  Stress Management, Discharge Planning.   Education Outcome: Acknowledges Education  Clinical Observations/Feedback: Pt attended and participated in activity.    Victorino Sparrow, LRT/CTRS    Ria Comment, Justinn Welter A 03/17/2019 10:59 AM

## 2019-03-17 NOTE — Progress Notes (Addendum)
   03/17/19 1100  Psych Admission Type (Psych Patients Only)  Admission Status Voluntary  Psychosocial Assessment  Patient Complaints Depression  Eye Contact Fair  Facial Expression Flat  Affect Flat  Speech Logical/coherent  Interaction Assertive  Motor Activity Slow  Appearance/Hygiene Unremarkable  Behavior Characteristics Cooperative  Mood Depressed  Aggressive Behavior  Effect No apparent injury  Thought Process  Coherency WDL  Content WDL  Delusions None reported or observed  Perception WDL  Hallucination None reported or observed  Judgment Impaired  Confusion None  Danger to Self  Current suicidal ideation? Passive  Self-Injurious Behavior No self-injurious ideation or behavior indicators observed or expressed   Agreement Not to Harm Self Yes  Description of Agreement verbal  Danger to Others  Danger to Others None reported or observed

## 2019-03-17 NOTE — Tx Team (Signed)
Interdisciplinary Treatment and Diagnostic Plan Update  03/17/2019 Time of Session: [id number] Karen Keller MRN: [id number]  Principal Diagnosis: Schizoaffective disorder [facility])  Secondary Diagnoses: Principal Problem:   Schizoaffective disorder ([place]) Active Problems:   Schizoaffective disorder, bipolar type ([place])   Current Medications:  Current Facility-Administered Medications  Medication Dose Route Frequency Provider Last Rate Last Admin  . acetaminophen (TYLENOL) tablet 650 mg  650 mg Oral Q6H PRN Anike, Adaku C, NP   650 mg at 03/16/19 1845  . albuterol (VENTOLIN HFA) 108 (90 Base) MCG/ACT inhaler 1 puff  1 puff Inhalation Q6H PRN Cobos, Myer Peer, MD   1 puff at 03/17/19 0836  . alum & mag hydroxide-simeth (MAALOX/MYLANTA) 200-200-20 MG/5ML suspension 30 mL  30 mL Oral Q4H PRN Anike, Adaku C, NP      . benzocaine (ORAJEL) 10 % mucosal gel   Mouth/Throat QID PRN Lindell Spar I, NP   1 application at [id number]  . divalproex (DEPAKOTE ER) 24 hr tablet 250 mg  250 mg Oral BID Cobos, Myer Peer, MD   250 mg at 03/17/19 [id number]  . haloperidol (HALDOL) tablet 2 mg  2 mg Oral BID Cobos, Myer Peer, MD   2 mg at 03/17/19 0814  . hydrALAZINE (APRESOLINE) tablet 25 mg  25 mg Oral Q8H Cobos, Myer Peer, MD   25 mg at 03/17/19 [id number]  . hydrOXYzine (ATARAX/VISTARIL) tablet 25 mg  25 mg Oral TID PRN Anike, Adaku C, NP      . lithium carbonate capsule 300 mg  300 mg Oral BID WC Cobos, Myer Peer, MD   300 mg at 03/17/19 0814  . LORazepam (ATIVAN) tablet 0.5 mg  0.5 mg Oral Q8H PRN Cobos, Fernando A, MD      . magnesium hydroxide (MILK OF MAGNESIA) suspension 30 mL  30 mL Oral Daily PRN Anike, Adaku C, NP      . PARoxetine (PAXIL) tablet 10 mg  10 mg Oral Daily Cobos, Myer Peer, MD   10 mg at 03/17/19 [id number]   PTA Medications: Medications Prior to Admission  Medication Sig Dispense Refill Last Dose  . acetaminophen (TYLENOL) 325 MG tablet Take 975 mg by mouth every 8 (eight) hours.       Marland Kitchen albuterol (PROAIR HFA) 108 (90 Base) MCG/ACT inhaler Inhale 2 puffs into the lungs 4 (four) times daily as needed for wheezing or shortness of breath. (Patient taking differently: Inhale 2 puffs into the lungs every 6 (six) hours as needed for wheezing or shortness of breath. )     . B Complex Vitamins (B COMPLEX-B12 PO) Take 1 tablet by mouth daily.     . calcium-vitamin D (OSCAL WITH D) 500-200 MG-UNIT tablet Take 1 tablet by mouth 2 (two) times daily.     . divalproex (DEPAKOTE SPRINKLE) 125 MG capsule Take 4 capsules (500 mg total) by mouth 2 (two) times daily. 240 capsule 0   . docusate sodium (COLACE) 100 MG capsule Take 1 capsule (100 mg total) by mouth daily. (May buy from over the counter): For constipation 10 capsule 0   . famotidine (PEPCID) 40 MG tablet Take 1 tablet (40 mg total) by mouth 2 (two) times daily as needed for heartburn or indigestion. 60 tablet 5   . haloperidol (HALDOL) 2 MG tablet Take 1 tablet (2 mg total) by mouth at bedtime. 30 tablet 0   . hydrALAZINE (APRESOLINE) 25 MG tablet Take 25 mg by mouth 4 (four) times daily.     Marland Kitchen  hydrOXYzine (ATARAX/VISTARIL) 10 MG tablet Take 1 tablet (10 mg total) by mouth 2 (two) times daily as needed for anxiety. 60 tablet 0   . hydrOXYzine (ATARAX/VISTARIL) 25 MG tablet Take 25 mg by mouth 4 (four) times daily as needed for anxiety.      . lidocaine (LIDODERM) 5 % Place 1 patch onto the skin daily. Remove & Discard patch within 12 hours or as directed by MD (Patient not taking: Reported on 03/15/2019) 30 patch 0   . LINZESS 290 MCG CAPS capsule TAKE 1 CAPSULE BY MOUTH EACH MORNING BEFORE BREAKFAST. (Patient taking differently: Take 290 mcg by mouth daily before breakfast. ) 30 capsule 11   . lithium carbonate 150 MG capsule Take 3 capsules (450 mg total) by mouth at bedtime. 90 capsule 0   . lithium carbonate 300 MG capsule Take 1 capsule (300 mg total) by mouth every morning. 30 capsule 0   . LORazepam (ATIVAN) 0.5 MG tablet Take 0.25  mg by mouth every 12 (twelve) hours as needed for anxiety.      . Multiple Vitamin (MULTIVITAMIN WITH MINERALS) TABS tablet Take 1 tablet by mouth daily.     . naproxen (NAPROSYN) 500 MG tablet Take 500 mg by mouth 2 (two) times daily as needed for mild pain or moderate pain.      Marland Kitchen OLANZapine (ZYPREXA) 5 MG tablet Take 5 mg by mouth at bedtime.     Marland Kitchen omeprazole (PRILOSEC) 40 MG capsule Take 1 capsule (40 mg total) by mouth daily before breakfast. 30 capsule 11   . oxybutynin (DITROPAN-XL) 10 MG 24 hr tablet Take 1 tablet (10 mg total) by mouth daily. For bladder spasms     . PARoxetine (PAXIL) 20 MG tablet Take 1 tablet (20 mg total) by mouth daily. 30 tablet 0   . senna (SENOKOT) 8.6 MG TABS tablet Take 1 tablet by mouth daily as needed for mild constipation.     Marland Kitchen tiotropium (SPIRIVA) 18 MCG inhalation capsule Place 18 mcg into inhaler and inhale daily.     . traMADol (ULTRAM) 50 MG tablet Take 50 mg by mouth 2 (two) times daily.       Patient Stressors: Financial difficulties Medication change or noncompliance  Patient Strengths: Average or above average intelligence Communication skills General fund of knowledge Motivation for treatment/growth  Treatment Modalities: Medication Management, Group therapy, Case management,  1 to 1 session with clinician, Psychoeducation, Recreational therapy.   Physician Treatment Plan for Primary Diagnosis: Schizoaffective disorder ([place]) Long Term Goal(s): Improvement in symptoms so as ready for discharge Improvement in symptoms so as ready for discharge   Short Term Goals: Ability to identify changes in lifestyle to reduce recurrence of condition will improve Ability to verbalize feelings will improve Ability to disclose and discuss suicidal ideas Ability to demonstrate self-control will improve Ability to identify and develop effective coping behaviors will improve Ability to maintain clinical measurements within normal limits will  improve Compliance with prescribed medications will improve  Medication Management: Evaluate patient's response, side effects, and tolerance of medication regimen.  Therapeutic Interventions: 1 to 1 sessions, Unit Group sessions and Medication administration.  Evaluation of Outcomes: Progressing  Physician Treatment Plan for Secondary Diagnosis: Principal Problem:   Schizoaffective disorder ([place]) Active Problems:   Schizoaffective disorder, bipolar type ([place])  Long Term Goal(s): Improvement in symptoms so as ready for discharge Improvement in symptoms so as ready for discharge   Short Term Goals: Ability to identify changes in lifestyle to reduce  recurrence of condition will improve Ability to verbalize feelings will improve Ability to disclose and discuss suicidal ideas Ability to demonstrate self-control will improve Ability to identify and develop effective coping behaviors will improve Ability to maintain clinical measurements within normal limits will improve Compliance with prescribed medications will improve     Medication Management: Evaluate patient's response, side effects, and tolerance of medication regimen.  Therapeutic Interventions: 1 to 1 sessions, Unit Group sessions and Medication administration.  Evaluation of Outcomes: Progressing   RN Treatment Plan for Primary Diagnosis: Schizoaffective disorder ([place]) Long Term Goal(s): Knowledge of disease and therapeutic regimen to maintain health will improve  Short Term Goals: Ability to remain free from injury will improve, Ability to verbalize feelings will improve, Ability to disclose and discuss suicidal ideas and Ability to identify and develop effective coping behaviors will improve  Medication Management: RN will administer medications as ordered by provider, will assess and evaluate patient's response and provide education to patient for prescribed medication. RN will report any adverse and/or side effects to  prescribing provider.  Therapeutic Interventions: 1 on 1 counseling sessions, Psychoeducation, Medication administration, Evaluate responses to treatment, Monitor vital signs and CBGs as ordered, Perform/monitor CIWA, COWS, AIMS and Fall Risk screenings as ordered, Perform wound care treatments as ordered.  Evaluation of Outcomes: Progressing   LCSW Treatment Plan for Primary Diagnosis: Schizoaffective disorder ([place]) Long Term Goal(s): Safe transition to appropriate next level of care at discharge, Engage patient in therapeutic group addressing interpersonal concerns.  Short Term Goals: Engage patient in aftercare planning with referrals and resources, Increase emotional regulation, Identify triggers associated with mental health/substance abuse issues and Increase skills for wellness and recovery  Therapeutic Interventions: Assess for all discharge needs, 1 to 1 time with Social worker, Explore available resources and support systems, Assess for adequacy in community support network, Educate family and significant other(s) on suicide prevention, Complete Psychosocial Assessment, Interpersonal group therapy.  Evaluation of Outcomes: Progressing   Progress in Treatment: Attending groups: Yes. Participating in groups: Yes. Taking medication as prescribed: Yes. Toleration medication: Yes. Family/Significant other contact made: No, will contact:  Group home manager, [name] Patient understands diagnosis: Yes. Discussing patient identified problems/goals with staff: Yes. Medical problems stabilized or resolved: Yes. Denies suicidal/homicidal ideation: No. Issues/concerns per patient self-inventory: Yes.  New problem(s) identified: No, Describe:  none.  New Short Term/Long Term Goal(s): medication management for mood stabilization; elimination of SI thoughts; development of comprehensive mental wellness/sobriety plan.  Patient Goals: [place]  Discharge Plan or Barriers: Will  return to her group home and continue following up with her outpatient providers at [facility].  Reason for Continuation of Hospitalization: Anxiety Depression Medication stabilization Suicidal ideation  Estimated Length of Stay: 3-5 days  Attendees: Patient: Karen Keller [id number] 8:53 AM  Physician: Larene Beach 03/17/2019 8:53 AM  Nursing: Mateo Flow RN 03/17/2019 8:53 AM  RN Care Manager: 03/17/2019 8:53 AM  Social Worker: Stephanie Acre, [place] 03/17/2019 8:53 AM  Recreational Therapist:  03/17/2019 8:53 AM  Other:  03/17/2019 8:53 AM  Other:  03/17/2019 8:53 AM  Other: 03/17/2019 8:53 AM    Scribe for Treatment Team: Joellen Jersey, [place] 03/17/2019 8:53 AM

## 2019-03-17 NOTE — Progress Notes (Signed)
Patient has been observed up in the dayroom watching tv with minimal interaction with peers. Writer spoke with her 1:1 and she reports her day as being good. She did c/o right leg pain and received pain medication. She was informed of her medications scheduled for tonight. Support given and safety maintained with 15 min checks.

## 2019-03-17 NOTE — Progress Notes (Signed)
Pt did not attend wrap-up group   
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
## 2019-03-17 NOTE — [facility] Suicide Risk Assessment (Signed)
[place] INPATIENT:  Family/Significant Other Suicide Prevention Education  Suicide Prevention Education:  Education Completed; [facility] owner, [name] [phone] has been identified by the patient as the family member/significant other with whom the patient will be residing, and identified as the person(s) who will aid the patient in the event of a mental health crisis (suicidal ideations/suicide attempt).  With written consent from the patient, the family member/significant other has been provided the following suicide prevention education, prior to the and/or following the discharge of the patient.  The suicide prevention education provided includes the following:  Suicide risk factors  Suicide prevention and interventions  National Suicide Hotline telephone number  [facility] assessment telephone number  [facility] Emergency Assistance [place] and/or Residential Mobile Crisis Unit telephone number  Request made of family/significant other to:  Remove weapons (e.g., guns, rifles, knives), all items previously/currently identified as safety concern.    Remove drugs/medications (over-the-counter, prescriptions, illicit drugs), all items previously/currently identified as a safety concern.  The family member/significant other verbalizes understanding of the suicide prevention education information provided.  The family member/significant other agrees to remove the items of safety concern listed above.  Ms.Rucker confirms patient is able to return to her group home at discharge. No concerns or questions at this time.  Prior to discharge, Ms.Rucker will need FL-2 and current medication list faxed to [phone].   Joellen Jersey 03/17/2019, 9:40 AM

## 2019-03-18 MED ORDER — PANTOPRAZOLE SODIUM 20 MG PO TBEC
20.0000 mg | DELAYED_RELEASE_TABLET | Freq: Two times a day (BID) | ORAL | Status: DC
  Administered 2019-03-18 – 2019-03-20 (×4): 20 mg via ORAL
  Filled 2019-03-18 (×10): qty 1

## 2019-03-18 MED ORDER — LITHIUM CARBONATE 150 MG PO CAPS
150.0000 mg | ORAL_CAPSULE | Freq: Two times a day (BID) | ORAL | Status: DC
  Administered 2019-03-18 – 2019-03-19 (×3): 150 mg via ORAL
  Filled 2019-03-18 (×8): qty 1

## 2019-03-18 MED ORDER — ASPIRIN 325 MG PO TABS
ORAL_TABLET | ORAL | Status: AC
  Filled 2019-03-18: qty 1

## 2019-03-18 MED ORDER — IBUPROFEN 400 MG PO TABS
400.0000 mg | ORAL_TABLET | Freq: Three times a day (TID) | ORAL | Status: DC | PRN
  Administered 2019-03-18 – 2019-03-20 (×5): 400 mg via ORAL
  Filled 2019-03-18 (×5): qty 1

## 2019-03-18 NOTE — Progress Notes (Addendum)
Patient approached nurse's station with complaints of sharp chest pain-  9/10. Patient does not appear diaphoretic, or in distress, and denies any other symptoms (dizziness, SOB, nausea). VS stable. EKG performed: results showed no changes from 03/15/19. MD notified
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
## 2019-03-18 NOTE — Progress Notes (Signed)
Time Group Notes:  (Nursing/MHT/Case Management/Adjunct)  Date:  03/18/2019  Time: 2030  Type of Therapy:  wrap up group  Participation Level:  Active  Participation Quality:  Appropriate, Attentive, Sharing and Supportive  Affect:  Depressed  Cognitive:  Appropriate  Insight:  Improving  Engagement in Group:  Engaged  Modes of Intervention:  Clarification, Education and Support  Summary of Progress/Problems: Pt reports getting most of her medications "straightened out". Pt plans on having a more positive attitude upon leaving the hospital. Pt is grateful for her belief in God.   Shellia Cleverly 03/18/2019, 9:06 PM

## 2019-03-18 NOTE — Progress Notes (Signed)
Patient reports relief from chest pain after receiving  Maalox. Pt observed in the dayroom sitting watching tv with peers.

## 2019-03-18 NOTE — Progress Notes (Addendum)
   03/18/19 1300  Psych Admission Type (Psych Patients Only)  Admission Status Voluntary  Psychosocial Assessment  Patient Complaints Depression  Eye Contact Fair  Facial Expression Flat  Affect Flat  Speech Logical/coherent  Interaction Assertive  Motor Activity Slow  Appearance/Hygiene Unremarkable  Behavior Characteristics Cooperative;Appropriate to situation  Mood Depressed;Pleasant  Aggressive Behavior  Effect No apparent injury  Thought Process  Coherency WDL  Content WDL  Delusions None reported or observed  Perception WDL  Hallucination None reported or observed  Judgment Impaired  Confusion None  Danger to Self  Current suicidal ideation? Denies  Self-Injurious Behavior No self-injurious ideation or behavior indicators observed or expressed   Agreement Not to Harm Self Yes  Description of Agreement verbal contract   Per pt's self inventory, pt rated her depression, hopelessness and anxiety all 5's. Pt currently denies SI/HI and A/V hallucinations. Pt remains safe with 15 min checks.

## 2019-03-19 LAB — BASIC METABOLIC PANEL
Anion gap: 6 (ref 5–15)
BUN: 23 mg/dL (ref 8–23)
CO2: 25 mmol/L (ref 22–32)
Calcium: 9.8 mg/dL (ref 8.9–10.3)
Chloride: 109 mmol/L (ref 98–111)
Creatinine, Ser: 0.95 mg/dL (ref 0.44–1.00)
GFR calc Af Amer: >60 mL/min (ref >60)
GFR calc non Af Amer: >60 mL/min (ref >60)
Glucose, Bld: 117 mg/dL — ABNORMAL HIGH (ref 70–99)
Potassium: 4.7 mmol/L (ref 3.5–5.1)
Sodium: 140 mmol/L (ref 135–145)

## 2019-03-19 LAB — MAGNESIUM: Magnesium: 2.3 mg/dL (ref 1.7–2.4)

## 2019-03-19 MED ORDER — LITHIUM CARBONATE 150 MG PO CAPS
150.0000 mg | ORAL_CAPSULE | Freq: Every day | ORAL | Status: DC
  Filled 2019-03-19 (×2): qty 1

## 2019-03-19 NOTE — Progress Notes (Signed)
[place] NOVEL CORONAVIRUS (COVID-19) DAILY CHECK-OFF SYMPTOMS - answer yes or no to each - every day NO YES  Have you had a fever in the past 24 hours?  . Fever (Temp > 37.80C / 100F) X   Have you had any of these symptoms in the past 24 hours? . New Cough .  Sore Throat  .  Shortness of Breath .  Difficulty Breathing .  Unexplained Body Aches   X   Have you had any one of these symptoms in the past 24 hours not related to allergies?   . Runny Nose .  Nasal Congestion .  Sneezing   X   If you have had runny nose, nasal congestion, sneezing in the past 24 hours, has it worsened?  X   EXPOSURES - check yes or no X   Have you traveled outside the state in the past 14 days?  X   Have you been in contact with someone with a confirmed diagnosis of COVID-19 or PUI in the past 14 days without wearing appropriate PPE?  X   Have you been living in the same home as a person with confirmed diagnosis of COVID-19 or a PUI (household contact)?    X   Have you been diagnosed with COVID-19?    X              What to do next: Answered NO to all: Answered YES to anything:   Proceed with unit schedule Follow the [facility] Inpatient Flowsheet.   
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
## 2019-03-19 NOTE — Progress Notes (Signed)
Psychoeducational Group Note  Date:  03/19/2019 Time:2030  Group Topic/Focus:  wrap up group  Participation Level: Did Not Attend  Participation Quality:  Not Applicable  Affect:  Not Applicable  Cognitive:  Not Applicable  Insight:  Not Applicable  Engagement in Group: Not Applicable  Additional Comments:  Pt was notified that group was beginning but remained in bed.   Shellia Cleverly 03/19/2019, 9:24 PM

## 2019-03-19 NOTE — Progress Notes (Signed)
D. Pt continues to present as flat, depressed, but voices no complaints this morning- denies SI and  A/V H. Pt observed in the dayroom this am interacting appropriately with peers and staff. Per pt's self inventory, pt rated her depression, hopelessness and anxiety all 5's. Pt writes that her goal today is "getting home" A. Labs and vitals monitored. Pt compliant with medications. Pt supported emotionally and encouraged to express concerns and ask questions.   R. Pt remains safe with 15 minute checks. Will continue POC.

## 2019-03-19 NOTE — Progress Notes (Signed)
   03/18/19 2100  Psych Admission Type (Psych Patients Only)  Admission Status Voluntary  Psychosocial Assessment  Patient Complaints Depression  Eye Contact Fair  Facial Expression Flat  Affect Flat  Speech Logical/coherent  Interaction Assertive  Motor Activity Slow  Appearance/Hygiene Unremarkable  Behavior Characteristics Cooperative  Mood Depressed;Pleasant  Aggressive Behavior  Effect No apparent injury  Thought Process  Coherency WDL  Delusions None reported or observed  Perception WDL  Hallucination None reported or observed  Judgment Impaired  Confusion None  Danger to Self  Current suicidal ideation? Denies  Agreement Not to Harm Self Yes  Description of Agreement verbal contract  Danger to Others  Danger to Others None reported or observed   Pt endorses pain 9/10 in her leg. Given tylenol 650 mg. Pt seen in dayroom in group with others. Pt denies SI, HI and AVH. Pt still minimal and flat. Contracts for safety.

## 2019-03-19 NOTE — Progress Notes (Signed)
Pt c/o pain in her right leg again this morning. Gave Tylenol 650 mg last night at bedtime. Pt given Ibuprofen 400 mg this morning. Pt said that she told MD yesterday and was told to take ibuprofen. Also said that nurses alternated Tylenol and Ibuprofen for hopefully better pain control.
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
## 2019-03-19 NOTE — Progress Notes (Signed)
[facility] Group Notes: (Nursing/MHT/Case Management/Adjunct)  Date:03/19/2019 Time:1300  Type of Therapy:Nurse Educationdiscussed goals for hospitalization  Participation Level:Did Not Attend

## 2019-03-20 MED ORDER — DIVALPROEX SODIUM ER 250 MG PO TB24: 250.0000 mg | ORAL_TABLET | ORAL | 0 refills | Status: AC

## 2019-03-20 MED ORDER — HYDRALAZINE HCL 25 MG PO TABS: 25.0000 mg | ORAL_TABLET | Freq: Four times a day (QID) | ORAL | 0 refills | Status: AC

## 2019-03-20 MED ORDER — PAROXETINE HCL 10 MG PO TABS: 10.0000 mg | ORAL_TABLET | Freq: Every day | ORAL | 0 refills | Status: AC

## 2019-03-20 MED ORDER — PANTOPRAZOLE SODIUM 20 MG PO TBEC: 20.0000 mg | DELAYED_RELEASE_TABLET | Freq: Two times a day (BID) | ORAL | 0 refills | Status: DC

## 2019-03-20 MED ORDER — HALOPERIDOL 2 MG PO TABS: 2.0000 mg | ORAL_TABLET | Freq: Two times a day (BID) | ORAL | 0 refills | Status: AC

## 2019-03-20 MED ORDER — ALBUTEROL SULFATE HFA 108 (90 BASE) MCG/ACT IN AERS: 1.0000 | INHALATION_SPRAY | Freq: Four times a day (QID) | RESPIRATORY_TRACT | 0 refills | Status: AC | PRN

## 2019-03-20 MED ORDER — DIVALPROEX SODIUM ER 500 MG PO TB24: 500.0000 mg | ORAL_TABLET | Freq: Every day | ORAL | 0 refills | Status: AC

## 2019-03-20 NOTE — Progress Notes (Signed)
Recreation Therapy Notes  Date:  1.11.21 Time: 0930 Location: [address] Dayroom  Group Topic: Stress Management  Goal Area(s) Addresses:  Patient will identify positive stress management techniques. Patient will identify benefits of using stress management post d/c.  Behavioral Response: Engaged  Intervention: Stress Management  Activity : Meditation.  LRT played a meditation that focused on making the most of your day and seeing each day as a new opportunity to accomplish something.  Patients were to listen and follow along as meditation played to engage.  Education:  Stress Management, Discharge Planning.   Education Outcome: Acknowledges Education  Clinical Observations/Feedback: Pt attended and participated in activity.    Victorino Sparrow, LRT/CTRS    Victorino Sparrow A 03/20/2019 11:48 AM

## 2019-03-20 NOTE — Progress Notes (Signed)
  [facility] Adult Case Management Discharge Plan :  Will you be returning to the same living situation after discharge:  Yes,  Pt's group home At discharge, do you have transportation home?: Yes,  [facility] Do you have the ability to pay for your medications: Yes,  [employer] medicare  Release of information consent forms completed and in the chart;  Patient's signature needed at discharge.  Patient to Follow up at:   Next level of care provider has access to [facility] and Suicide Prevention discussed: Yes,  pt's group home owner  Have you used any form of tobacco in the last 30 days? (Cigarettes, Smokeless Tobacco, Cigars, and/or Pipes): Yes  Has patient been referred to the Quitline?: N/A patient is not a smoker  Patient has been referred for addiction treatment: Yes  Trecia Rogers, LCSW 03/20/2019, 9:59 AM

## 2019-03-20 NOTE — NC FL2 (Signed)
[place] LEVEL OF CARE SCREENING TOOL     IDENTIFICATION  Patient Name: Karen Keller Birthdate: 1955/05/04 Sex: female Admission Date (Current Location): 03/16/2019  [facility] and [place] Number:  Herbalist and Address:  [facility] [place], [place] [address] [place], [place] [address]      Provider Number: [id number]  Attending Physician Name and Address:  Sharma Covert, MD  Relative Name and Phone Number:  [name], [place] [phone]    Current Level of Care: Hospital Recommended Level of Care: Other (Comment)(Group Home) Prior Approval Number:    Date Approved/Denied:   PASRR Number: [id number]  Discharge Plan: Other (Comment)(Group Home)    Current Diagnoses: Patient Active Problem List   Diagnosis Date Noted  . History of hepatitis C 02/14/2019  . Schizoaffective disorder, bipolar type ([place]) 01/16/2019  . GERD (gastroesophageal reflux disease) 07/28/2018  . Schizoaffective disorder ([place]) 04/11/2018  . MDD (major depressive disorder), severe ([place]) 04/06/2018  . Macrocytosis 02/16/2018  . COPD (chronic obstructive pulmonary disease) ([place]) 02/16/2018  . Bipolar disorder ([place]) 02/16/2018  . HTN (hypertension) 02/16/2018  . H/O adenomatous polyp of colon   . Chronic hepatitis C without hepatic coma ([place]) 11/12/2017  . Abnormal LFTs 06/15/2017  . Constipation 06/15/2017  . Anemia 03/24/2017  . Bilateral lower extremity edema 01/01/2016  . Encounter for screening colonoscopy 01/01/2016  . Macrocytic anemia 01/01/2016  . Major depressive disorder, single episode 03/26/2015    Orientation RESPIRATION BLADDER Height & Weight     Time, Self, Place, Situation  Normal Continent Weight: 80.7 kg Height:  5' 4.17" (163 cm)  BEHAVIORAL SYMPTOMS/MOOD NEUROLOGICAL BOWEL NUTRITION STATUS      Continent Diet(Regular)  AMBULATORY STATUS COMMUNICATION OF NEEDS Skin   Independent Verbally Normal                        Personal Care Assistance Level of Assistance  Bathing, Feeding, Dressing, Total care Bathing Assistance: Independent Feeding assistance: Independent Dressing Assistance: Independent Total Care Assistance: Independent   Functional Limitations Info             SPECIAL CARE FACTORS FREQUENCY                       Contractures Contractures Info: Not present    Additional Factors Info  Code Status, Allergies Code Status Info: FULL Allergies Info: Neurontin, Septra Ds           Current Medications (03/20/2019):  This is the current hospital active medication list Current Facility-Administered Medications  Medication Dose Route Frequency Provider Last Rate Last Admin  . acetaminophen (TYLENOL) tablet 650 mg  650 mg Oral Q6H PRN Anike, Adaku C, NP   650 mg at 03/20/19 0556  . albuterol (VENTOLIN HFA) 108 (90 Base) MCG/ACT inhaler 1 puff  1 puff Inhalation Q6H PRN Cobos, Myer Peer, MD   1 puff at 03/19/19 1439  . alum & mag hydroxide-simeth (MAALOX/MYLANTA) 200-200-20 MG/5ML suspension 30 mL  30 mL Oral Q4H PRN Anike, Adaku C, NP   30 mL at 03/18/19 1338  . benzocaine (ORAJEL) 10 % mucosal gel   Mouth/Throat QID PRN Lindell Spar I, NP   1 application at [id number]  . divalproex (DEPAKOTE ER) 24 hr tablet 250 mg  250 mg Oral BH-q7a Cobos, Myer Peer, MD   250 mg at 03/20/19 0557  . divalproex (DEPAKOTE ER) 24 hr tablet 500  mg  500 mg Oral QHS Cobos, Myer Peer, MD   500 mg at 03/19/19 2104  . haloperidol (HALDOL) tablet 2 mg  2 mg Oral BID Cobos, Myer Peer, MD   2 mg at 03/20/19 0814  . hydrALAZINE (APRESOLINE) tablet 25 mg  25 mg Oral QID Cobos, Myer Peer, MD   25 mg at 03/20/19 0814  . ibuprofen (ADVIL) tablet 400 mg  400 mg Oral Q8H PRN Cobos, Myer Peer, MD   400 mg at 03/19/19 2104  . LORazepam (ATIVAN) tablet 0.5 mg  0.5 mg Oral Q8H PRN Cobos, Fernando A, MD      . magnesium hydroxide (MILK OF MAGNESIA) suspension 30 mL  30 mL Oral Daily PRN  Anike, Adaku C, NP      . pantoprazole (PROTONIX) EC tablet 20 mg  20 mg Oral BID Cobos, Myer Peer, MD   20 mg at 03/20/19 0814  . PARoxetine (PAXIL) tablet 10 mg  10 mg Oral Daily Cobos, Myer Peer, MD   10 mg at 03/20/19 [id number]     Discharge Medications: Please see discharge summary for a list of discharge medications.  Relevant Imaging Results:  Relevant Lab Results:   Additional Information SSN: [id number]  Joellen Jersey, [place]
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
## 2019-03-20 NOTE — BHH Suicide Risk Assessment (Addendum)
[facility] Discharge Suicide Risk Assessment   Principal Problem: Schizoaffective disorder [facility]) Discharge Diagnoses: Principal Problem:   Schizoaffective disorder ([place]) Active Problems:   Schizoaffective disorder, bipolar type ([place])   Total Time spent with patient: 30 minutes  Musculoskeletal: Strength & Muscle Tone: within normal limits Gait & Station: normal Patient leans: N/A  Psychiatric Specialty Exam: Review of Systems  currently denies headache, no chest pain at this time no shortness of breath at room air, no vomiting , no rash  Blood pressure (!) 157/79, pulse 63, temperature 97.8 F (36.6 C), temperature source Oral, resp. rate 20, height 5' 4.17" (1.63 m), weight 80.7 kg, SpO2 96 %.Body mass index is 30.39 kg/m.  General Appearance: improving grooming   Eye Contact::  Good  Speech:  Normal Rate409  Volume:  Normal  Mood:  reports her mood has improved and currently states she does not feel depressed   Affect:  appropriate and more reactive, brighter   Thought Process:  Linear and Descriptions of Associations: Circumstantial  Orientation:  Other:  fully alert and attentive  Thought Content:  no hallucinations, denies having had any further visual hallucinations and does not appear internally preoccupied, no delusions are expressed  Suicidal Thoughts:  No denies suicidal or self injurious ideations, denies homicidal or violent ideations  Homicidal Thoughts:  No  Memory:  recent and remote grossly intact   Judgement:  Other:  improving   Insight:  improving   Psychomotor Activity:  Normal and no current psychmotor agitation or restlessness   Concentration:  Good  Recall:  Good  Fund of Knowledge:Good  Language: Good  Akathisia:  Negative  Handed:  Right  AIMS (if indicated):     Assets:  Communication Skills Desire for Improvement Resilience  Sleep:  Number of Hours: 4  Cognition: WNL  ADL's:  Intact   Mental Status Per Nursing Assessment::   On Admission:   Self-harm behaviors  Demographic Factors:  63, single, no children, group home resident   Loss Factors: No specific stressors reported. History of chronic mental illness, has been diagnosed with Schizoaffective Disorder in the past   Historical Factors: History of prior psychiatric admissions, history of Schizoaffective Disorder diagnosis in the past   Risk Reduction Factors:   Living with another person, especially a relative and Positive coping skills or problem solving skills  Continued Clinical Symptoms:  Today patient presents fully alert , attentive, calm, pleasant on approach, in no acute distress, reports mood is improved and currently denies feeling depressed, affect is noted to be more reactive and fuller in range, no thought disorder, no suicidal or self injurious ideations, no homicidal or violent ideations, denies any visual ( or other) hallucinations and does not appear internally preoccupied, no delusions are expressed. Denies medication side effects. Regarding medications , she is currently on Depakote ER and on low dose Haldol. States these medications have been helpful and well tolerated . She has also been on Lithium . She has expressed interest in tapering off due to its side effect / potential toxicity profile and as it may have been a contributor to QTc prolongation. ( It was also noted that Haloperidol could also contribute to QTc prolongation but patient has tolerated this medication well and has reported that it has been extremely helpful in minimizing/resolving disturbing visual hallucinations, so was continued ) . Her last QTc was normalized to 459  on 1/10 . Behavior on unit calm and in good control, polite and pleasant on approach.  Cognitive Features  That Contribute To Risk:  No gross cognitive deficits noted upon discharge. Is alert , attentive, and oriented x 3   Suicide Risk:  Mild:  Suicidal ideation of limited frequency, intensity, duration, and specificity.   There are no identifiable plans, no associated intent, mild dysphoria and related symptoms, good self-control (both objective and subjective assessment), few other risk factors, and identifiable protective factors, including available and accessible social support.    Plan Of Care/Follow-up recommendations:  Activity:  as tolerated  Diet:  heart healthy Tests:  NA Other:  See below  Patient is expressing readiness for discharge and is leaving unit in good spirits . Plans to return to [place] environment. Plans to continue outpatient psychiatric follow ups and also encouraged to follow up with PCP /Cardiology referral if indicated .   Jenne Campus, MD 03/20/2019, 10:32 AM

## 2019-03-20 NOTE — Progress Notes (Signed)
Pt discharged to lobby. Pt was stable and appreciative at that time. All papers and prescriptions were given and valuables returned. Verbal understanding expressed. Denies SI/HI and A/VH. Pt given opportunity to express concerns and ask questions.  

## 2019-03-20 NOTE — BHH Counselor (Signed)
CSW spoke with Karen Keller, [place] [phone]. [name] confirms patient is able to return to the group home today, once she receives the following information by fax [phone]): COVID test results, co-signed FL-2, and current medication list.  Patient will need transportation assistance back to her group home.   Stephanie Acre, MSW, [place] Social Worker [facility] Unit  [phone]

## 2019-03-21 ENCOUNTER — Telehealth: Payer: Self-pay | Admitting: *Deleted

## 2019-03-21 NOTE — Telephone Encounter (Signed)
-----   Message from Minus Breeding, MD sent at 03/20/2019  9:10 AM EST ----- Can we get her a new patient appt this week .  Can be with any of the MDs.  Let me know when and with whom  Thanks.   ----- Message ----- From: Minus Breeding, MD Sent: 03/18/2019   1:27 PM EST To: Minus Breeding, MD  Needs a new patient appt for abnormal EKG.

## 2019-03-21 NOTE — Telephone Encounter (Signed)
Patient is scheduled for 01/12/2020. 

## 2019-03-22 DIAGNOSIS — F250 Schizoaffective disorder, bipolar type: Secondary | ICD-10-CM | POA: Diagnosis not present
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
## 2019-03-24 ENCOUNTER — Ambulatory Visit: Payer: Medicare HMO | Admitting: Cardiovascular Disease

## 2019-03-28 DIAGNOSIS — J449 Chronic obstructive pulmonary disease, unspecified: Secondary | ICD-10-CM | POA: Diagnosis not present

## 2019-03-28 DIAGNOSIS — R45851 Suicidal ideations: Secondary | ICD-10-CM | POA: Diagnosis not present

## 2019-03-28 DIAGNOSIS — F250 Schizoaffective disorder, bipolar type: Secondary | ICD-10-CM | POA: Diagnosis not present

## 2019-03-28 DIAGNOSIS — F17200 Nicotine dependence, unspecified, uncomplicated: Secondary | ICD-10-CM | POA: Diagnosis not present

## 2019-04-19 DIAGNOSIS — F250 Schizoaffective disorder, bipolar type: Secondary | ICD-10-CM | POA: Diagnosis not present

## 2019-04-28 DIAGNOSIS — I10 Essential (primary) hypertension: Secondary | ICD-10-CM | POA: Diagnosis not present

## 2019-04-28 DIAGNOSIS — F319 Bipolar disorder, unspecified: Secondary | ICD-10-CM | POA: Diagnosis not present

## 2019-05-22 ENCOUNTER — Emergency Department (HOSPITAL_COMMUNITY): Payer: Medicare HMO

## 2019-05-22 ENCOUNTER — Other Ambulatory Visit: Payer: Self-pay

## 2019-05-22 ENCOUNTER — Inpatient Hospital Stay (HOSPITAL_COMMUNITY)
Admission: EM | Admit: 2019-05-22 | Discharge: 2019-05-24 | DRG: 689 | Disposition: A | Discharge status: Home Health | Payer: Medicare HMO | Attending: Internal Medicine | Admitting: Internal Medicine

## 2019-05-22 ENCOUNTER — Encounter (HOSPITAL_COMMUNITY): Payer: Self-pay

## 2019-05-22 DIAGNOSIS — F419 Anxiety disorder, unspecified: Secondary | ICD-10-CM | POA: Diagnosis not present

## 2019-05-22 DIAGNOSIS — G9340 Encephalopathy, unspecified: Secondary | ICD-10-CM

## 2019-05-22 DIAGNOSIS — K219 Gastro-esophageal reflux disease without esophagitis: Secondary | ICD-10-CM | POA: Diagnosis present

## 2019-05-22 DIAGNOSIS — Z882 Allergy status to sulfonamides status: Secondary | ICD-10-CM | POA: Diagnosis not present

## 2019-05-22 DIAGNOSIS — G9341 Metabolic encephalopathy: Secondary | ICD-10-CM | POA: Diagnosis not present

## 2019-05-22 DIAGNOSIS — N390 Urinary tract infection, site not specified: Secondary | ICD-10-CM | POA: Diagnosis not present

## 2019-05-22 DIAGNOSIS — Z20822 Contact with and (suspected) exposure to covid-19: Secondary | ICD-10-CM | POA: Diagnosis not present

## 2019-05-22 DIAGNOSIS — Z981 Arthrodesis status: Secondary | ICD-10-CM | POA: Diagnosis not present

## 2019-05-22 DIAGNOSIS — F250 Schizoaffective disorder, bipolar type: Secondary | ICD-10-CM | POA: Diagnosis present

## 2019-05-22 DIAGNOSIS — Z79899 Other long term (current) drug therapy: Secondary | ICD-10-CM | POA: Diagnosis not present

## 2019-05-22 DIAGNOSIS — I10 Essential (primary) hypertension: Secondary | ICD-10-CM | POA: Diagnosis not present

## 2019-05-22 DIAGNOSIS — Z888 Allergy status to other drugs, medicaments and biological substances status: Secondary | ICD-10-CM

## 2019-05-22 DIAGNOSIS — J449 Chronic obstructive pulmonary disease, unspecified: Secondary | ICD-10-CM | POA: Diagnosis not present

## 2019-05-22 DIAGNOSIS — I129 Hypertensive chronic kidney disease with stage 1 through stage 4 chronic kidney disease, or unspecified chronic kidney disease: Secondary | ICD-10-CM | POA: Diagnosis present

## 2019-05-22 DIAGNOSIS — I4891 Unspecified atrial fibrillation: Secondary | ICD-10-CM | POA: Diagnosis not present

## 2019-05-22 DIAGNOSIS — Z8249 Family history of ischemic heart disease and other diseases of the circulatory system: Secondary | ICD-10-CM

## 2019-05-22 DIAGNOSIS — R4182 Altered mental status, unspecified: Secondary | ICD-10-CM | POA: Diagnosis not present

## 2019-05-22 DIAGNOSIS — B961 Klebsiella pneumoniae [K. pneumoniae] as the cause of diseases classified elsewhere: Secondary | ICD-10-CM | POA: Diagnosis present

## 2019-05-22 DIAGNOSIS — R404 Transient alteration of awareness: Secondary | ICD-10-CM | POA: Diagnosis not present

## 2019-05-22 DIAGNOSIS — K5909 Other constipation: Secondary | ICD-10-CM | POA: Diagnosis present

## 2019-05-22 DIAGNOSIS — Z8619 Personal history of other infectious and parasitic diseases: Secondary | ICD-10-CM

## 2019-05-22 DIAGNOSIS — N1831 Chronic kidney disease, stage 3a: Secondary | ICD-10-CM | POA: Diagnosis present

## 2019-05-22 DIAGNOSIS — M6281 Muscle weakness (generalized): Secondary | ICD-10-CM | POA: Diagnosis not present

## 2019-05-22 DIAGNOSIS — R32 Unspecified urinary incontinence: Secondary | ICD-10-CM | POA: Diagnosis present

## 2019-05-22 DIAGNOSIS — E870 Hyperosmolality and hypernatremia: Secondary | ICD-10-CM | POA: Diagnosis not present

## 2019-05-22 DIAGNOSIS — R0902 Hypoxemia: Secondary | ICD-10-CM | POA: Diagnosis not present

## 2019-05-22 DIAGNOSIS — F17210 Nicotine dependence, cigarettes, uncomplicated: Secondary | ICD-10-CM | POA: Diagnosis present

## 2019-05-22 LAB — TSH: TSH: 1.347 u[IU]/mL (ref 0.350–4.500)

## 2019-05-22 LAB — COMPREHENSIVE METABOLIC PANEL
ALT: 26 U/L (ref 0–44)
AST: 25 U/L (ref 15–41)
Albumin: 4.5 g/dL (ref 3.5–5.0)
Alkaline Phosphatase: 139 U/L — ABNORMAL HIGH (ref 38–126)
Anion gap: 13 (ref 5–15)
BUN: 27 mg/dL — ABNORMAL HIGH (ref 8–23)
CO2: 25 mmol/L (ref 22–32)
Calcium: 11.1 mg/dL — ABNORMAL HIGH (ref 8.9–10.3)
Chloride: 109 mmol/L (ref 98–111)
Creatinine, Ser: 1.03 mg/dL — ABNORMAL HIGH (ref 0.44–1.00)
GFR calc Af Amer: >60 mL/min (ref >60)
GFR calc non Af Amer: 57 mL/min — ABNORMAL LOW (ref >60)
Glucose, Bld: 116 mg/dL — ABNORMAL HIGH (ref 70–99)
Potassium: 4.2 mmol/L (ref 3.5–5.1)
Sodium: 147 mmol/L — ABNORMAL HIGH (ref 135–145)
Total Bilirubin: 1.1 mg/dL (ref 0.3–1.2)
Total Protein: 8 g/dL (ref 6.5–8.1)

## 2019-05-22 LAB — BLOOD GAS, ARTERIAL
Acid-Base Excess: 1.2 mmol/L (ref 0.0–2.0)
Bicarbonate: 25.6 mmol/L (ref 20.0–28.0)
FIO2: 21
O2 Saturation: 96.2 %
Patient temperature: 37.5
pCO2 arterial: 38.5 mmHg (ref 32.0–48.0)
pH, Arterial: 7.43 (ref 7.350–7.450)
pO2, Arterial: 84.6 mmHg (ref 83.0–108.0)

## 2019-05-22 LAB — URINALYSIS, ROUTINE W REFLEX MICROSCOPIC
Bilirubin Urine: NEGATIVE
Glucose, UA: NEGATIVE mg/dL
Ketones, ur: NEGATIVE mg/dL
Nitrite: POSITIVE — AB
Protein, ur: NEGATIVE mg/dL
Specific Gravity, Urine: 1.016 (ref 1.005–1.030)
WBC, UA: >50 WBC/hpf — ABNORMAL HIGH (ref 0–5)
pH: 6 (ref 5.0–8.0)

## 2019-05-22 LAB — CBC WITH DIFFERENTIAL/PLATELET
Abs Immature Granulocytes: 0.09 10*3/uL — ABNORMAL HIGH (ref 0.00–0.07)
Basophils Absolute: 0.1 10*3/uL (ref 0.0–0.1)
Basophils Relative: 1 %
Eosinophils Absolute: 0.2 10*3/uL (ref 0.0–0.5)
Eosinophils Relative: 1 %
HCT: 45.9 % (ref 36.0–46.0)
Hemoglobin: 14.4 g/dL (ref 12.0–15.0)
Immature Granulocytes: 1 %
Lymphocytes Relative: 18 %
Lymphs Abs: 2 10*3/uL (ref 0.7–4.0)
MCH: 32.3 pg (ref 26.0–34.0)
MCHC: 31.4 g/dL (ref 30.0–36.0)
MCV: 102.9 fL — ABNORMAL HIGH (ref 80.0–100.0)
Monocytes Absolute: 1.1 10*3/uL — ABNORMAL HIGH (ref 0.1–1.0)
Monocytes Relative: 10 %
Neutro Abs: 7.5 10*3/uL (ref 1.7–7.7)
Neutrophils Relative %: 69 %
Platelets: 239 10*3/uL (ref 150–400)
RBC: 4.46 MIL/uL (ref 3.87–5.11)
RDW: 13.7 % (ref 11.5–15.5)
WBC: 11 10*3/uL — ABNORMAL HIGH (ref 4.0–10.5)
nRBC: 0 % (ref 0.0–0.2)

## 2019-05-22 LAB — AMMONIA: Ammonia: 16 umol/L (ref 9–35)

## 2019-05-22 LAB — LITHIUM LEVEL: Lithium Lvl: 0.63 mmol/L (ref 0.60–1.20)

## 2019-05-22 LAB — VALPROIC ACID LEVEL: Valproic Acid Lvl: <10 ug/mL — ABNORMAL LOW (ref 50.0–100.0)

## 2019-05-22 LAB — HIV ANTIBODY (ROUTINE TESTING W REFLEX): HIV Screen 4th Generation wRfx: NONREACTIVE

## 2019-05-22 LAB — SARS CORONAVIRUS 2 (TAT 6-24 HRS): SARS Coronavirus 2: NEGATIVE

## 2019-05-22 LAB — MRSA PCR SCREENING: MRSA by PCR: NEGATIVE

## 2019-05-22 MED ORDER — B COMPLEX-C PO TABS
1.0000 | ORAL_TABLET | Freq: Every day | ORAL | Status: DC
  Administered 2019-05-23 – 2019-05-24 (×2): 1 via ORAL
  Filled 2019-05-22 (×3): qty 1

## 2019-05-22 MED ORDER — LINACLOTIDE 290 MCG PO CAPS
290.0000 ug | ORAL_CAPSULE | Freq: Every day | ORAL | Status: DC
  Filled 2019-05-22 (×2): qty 1

## 2019-05-22 MED ORDER — PAROXETINE HCL 10 MG PO TABS
10.0000 mg | ORAL_TABLET | Freq: Every day | ORAL | Status: DC
  Filled 2019-05-22 (×2): qty 1

## 2019-05-22 MED ORDER — B COMPLEX-C PO TABS: 1.0000 | ORAL_TABLET | Freq: Every day | ORAL | Status: DC

## 2019-05-22 MED ORDER — SODIUM CHLORIDE 0.9 % IV SOLN: INTRAVENOUS | Status: DC

## 2019-05-22 MED ORDER — ONDANSETRON HCL 4 MG PO TABS: 4.0000 mg | ORAL_TABLET | Freq: Four times a day (QID) | ORAL | Status: DC | PRN

## 2019-05-22 MED ORDER — LITHIUM CARBONATE 300 MG PO CAPS
300.0000 mg | ORAL_CAPSULE | Freq: Every morning | ORAL | Status: DC
  Administered 2019-05-23 – 2019-05-24 (×2): 300 mg via ORAL
  Filled 2019-05-22 (×2): qty 1
  Filled 2019-05-22: qty 2
  Filled 2019-05-22: qty 1
  Filled 2019-05-22: qty 2
  Filled 2019-05-22: qty 1

## 2019-05-22 MED ORDER — LITHIUM CARBONATE 300 MG PO CAPS
450.0000 mg | ORAL_CAPSULE | Freq: Every day | ORAL | Status: DC
  Administered 2019-05-23: 450 mg via ORAL
  Filled 2019-05-22 (×4): qty 1

## 2019-05-22 MED ORDER — TIOTROPIUM BROMIDE MONOHYDRATE 18 MCG IN CAPS
1.0000 | ORAL_CAPSULE | Freq: Every day | RESPIRATORY_TRACT | Status: DC
  Filled 2019-05-22: qty 5

## 2019-05-22 MED ORDER — SODIUM CHLORIDE 0.9 % IV SOLN
1.0000 g | INTRAVENOUS | Status: DC
  Administered 2019-05-23: 1 g via INTRAVENOUS
  Filled 2019-05-22: qty 10

## 2019-05-22 MED ORDER — ERYTHROMYCIN 5 MG/GM OP OINT
1.0000 "application " | TOPICAL_OINTMENT | Freq: Once | OPHTHALMIC | Status: AC
  Administered 2019-05-22: 1 via OPHTHALMIC
  Filled 2019-05-22: qty 3.5

## 2019-05-22 MED ORDER — OXYBUTYNIN CHLORIDE ER 10 MG PO TB24
10.0000 mg | ORAL_TABLET | Freq: Every day | ORAL | Status: DC
  Filled 2019-05-22 (×2): qty 1

## 2019-05-22 MED ORDER — LINACLOTIDE 145 MCG PO CAPS
290.0000 ug | ORAL_CAPSULE | Freq: Every day | ORAL | Status: DC
  Administered 2019-05-23 – 2019-05-24 (×2): 290 ug via ORAL
  Filled 2019-05-22: qty 2
  Filled 2019-05-22 (×3): qty 1

## 2019-05-22 MED ORDER — DIVALPROEX SODIUM ER 250 MG PO TB24: 250.0000 mg | ORAL_TABLET | ORAL | Status: DC

## 2019-05-22 MED ORDER — HALOPERIDOL 2 MG PO TABS
2.0000 mg | ORAL_TABLET | Freq: Two times a day (BID) | ORAL | Status: DC
  Filled 2019-05-22: qty 1

## 2019-05-22 MED ORDER — DIVALPROEX SODIUM ER 500 MG PO TB24
500.0000 mg | ORAL_TABLET | Freq: Every day | ORAL | Status: DC
  Administered 2019-05-23: 500 mg via ORAL
  Filled 2019-05-22 (×2): qty 1

## 2019-05-22 MED ORDER — OYSTER SHELL CALCIUM/D 500-200 MG-UNIT PO TABS: 1.0000 | ORAL_TABLET | Freq: Two times a day (BID) | ORAL | Status: DC

## 2019-05-22 MED ORDER — DIVALPROEX SODIUM ER 250 MG PO TB24
250.0000 mg | ORAL_TABLET | ORAL | Status: DC
  Filled 2019-05-22 (×2): qty 1

## 2019-05-22 MED ORDER — ACETAMINOPHEN 325 MG PO TABS: 650.0000 mg | ORAL_TABLET | Freq: Four times a day (QID) | ORAL | Status: DC | PRN

## 2019-05-22 MED ORDER — HALOPERIDOL LACTATE 5 MG/ML IJ SOLN
2.0000 mg | Freq: Two times a day (BID) | INTRAMUSCULAR | Status: DC
  Administered 2019-05-22 – 2019-05-24 (×4): 2 mg via INTRAVENOUS
  Filled 2019-05-22 (×4): qty 1

## 2019-05-22 MED ORDER — SODIUM CHLORIDE 0.9 % IV SOLN
1.0000 g | Freq: Once | INTRAVENOUS | Status: AC
  Administered 2019-05-22: 1 g via INTRAVENOUS
  Filled 2019-05-22: qty 10

## 2019-05-22 MED ORDER — ALBUTEROL SULFATE (2.5 MG/3ML) 0.083% IN NEBU: 2.5000 mg | INHALATION_SOLUTION | Freq: Four times a day (QID) | RESPIRATORY_TRACT | Status: DC | PRN

## 2019-05-22 MED ORDER — DOCUSATE SODIUM 100 MG PO CAPS
100.0000 mg | ORAL_CAPSULE | Freq: Every day | ORAL | Status: DC
  Administered 2019-05-23 – 2019-05-24 (×2): 100 mg via ORAL
  Filled 2019-05-22 (×2): qty 1

## 2019-05-22 MED ORDER — PAROXETINE HCL 10 MG PO TABS
10.0000 mg | ORAL_TABLET | Freq: Every day | ORAL | Status: DC
  Administered 2019-05-23 – 2019-05-24 (×2): 10 mg via ORAL
  Filled 2019-05-22 (×2): qty 1

## 2019-05-22 MED ORDER — HEPARIN SODIUM (PORCINE) 5000 UNIT/ML IJ SOLN
5000.0000 [IU] | Freq: Three times a day (TID) | INTRAMUSCULAR | Status: DC
  Administered 2019-05-22 – 2019-05-24 (×5): 5000 [IU] via SUBCUTANEOUS
  Filled 2019-05-22 (×5): qty 1

## 2019-05-22 MED ORDER — OXYBUTYNIN CHLORIDE ER 5 MG PO TB24
ORAL_TABLET | ORAL | Status: AC
  Filled 2019-05-22: qty 2

## 2019-05-22 MED ORDER — PANTOPRAZOLE SODIUM 40 MG IV SOLR
40.0000 mg | Freq: Every day | INTRAVENOUS | Status: DC
  Administered 2019-05-23 – 2019-05-24 (×2): 40 mg via INTRAVENOUS
  Filled 2019-05-22 (×2): qty 40

## 2019-05-22 MED ORDER — DIVALPROEX SODIUM ER 500 MG PO TB24: 500.0000 mg | ORAL_TABLET | Freq: Every day | ORAL | Status: DC

## 2019-05-22 MED ORDER — ADULT MULTIVITAMIN W/MINERALS CH
1.0000 | ORAL_TABLET | Freq: Every day | ORAL | Status: DC
  Administered 2019-05-23 – 2019-05-24 (×2): 1 via ORAL
  Filled 2019-05-22 (×3): qty 1

## 2019-05-22 MED ORDER — HYDRALAZINE HCL 25 MG PO TABS
25.0000 mg | ORAL_TABLET | Freq: Four times a day (QID) | ORAL | Status: DC
  Filled 2019-05-22: qty 1

## 2019-05-22 MED ORDER — TIOTROPIUM BROMIDE MONOHYDRATE 18 MCG IN CAPS: 1.0000 | ORAL_CAPSULE | Freq: Every day | RESPIRATORY_TRACT | Status: DC

## 2019-05-22 MED ORDER — LACTATED RINGERS IV SOLN: INTRAVENOUS | Status: DC

## 2019-05-22 MED ORDER — ACETAMINOPHEN 650 MG RE SUPP: 650.0000 mg | Freq: Four times a day (QID) | RECTAL | Status: DC | PRN

## 2019-05-22 MED ORDER — PANTOPRAZOLE SODIUM 40 MG PO TBEC: 40.0000 mg | DELAYED_RELEASE_TABLET | Freq: Every day | ORAL | Status: DC

## 2019-05-22 MED ORDER — CALCIUM CARBONATE-VITAMIN D 500-200 MG-UNIT PO TABS
1.0000 | ORAL_TABLET | Freq: Two times a day (BID) | ORAL | Status: DC
  Administered 2019-05-23 – 2019-05-24 (×3): 1 via ORAL
  Filled 2019-05-22 (×4): qty 1

## 2019-05-22 MED ORDER — OXYBUTYNIN CHLORIDE ER 5 MG PO TB24
10.0000 mg | ORAL_TABLET | Freq: Every day | ORAL | Status: DC
  Administered 2019-05-23 – 2019-05-24 (×2): 10 mg via ORAL
  Filled 2019-05-22: qty 2
  Filled 2019-05-22 (×3): qty 1

## 2019-05-22 MED ORDER — VALPROATE SODIUM 500 MG/5ML IV SOLN: 500.0000 mg | Freq: Once | INTRAVENOUS | Status: DC

## 2019-05-22 MED ORDER — LABETALOL HCL 5 MG/ML IV SOLN
10.0000 mg | INTRAVENOUS | Status: DC | PRN
  Administered 2019-05-22: 10 mg via INTRAVENOUS
  Filled 2019-05-22: qty 4

## 2019-05-22 MED ORDER — ONE-A-DAY WOMENS PO TABS: ORAL_TABLET | Freq: Every day | ORAL | Status: DC

## 2019-05-22 MED ORDER — ONDANSETRON HCL 4 MG/2ML IJ SOLN: 4.0000 mg | Freq: Four times a day (QID) | INTRAMUSCULAR | Status: DC | PRN

## 2019-05-22 NOTE — ED Triage Notes (Signed)
Per EMS, staff at [facility] reports altered mental status that started early this morning.
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
## 2019-05-22 NOTE — Progress Notes (Signed)
Patient alert and resting in bed. Patient unable to follow commands, refusing to drink water. Unable to tolerate PO meds at this time. Midlevel notified.
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
## 2019-05-23 LAB — CBC
HCT: 43.9 % (ref 36.0–46.0)
Hemoglobin: 13.5 g/dL (ref 12.0–15.0)
MCH: 32.2 pg (ref 26.0–34.0)
MCHC: 30.8 g/dL (ref 30.0–36.0)
MCV: 104.8 fL — ABNORMAL HIGH (ref 80.0–100.0)
Platelets: 213 10*3/uL (ref 150–400)
RBC: 4.19 MIL/uL (ref 3.87–5.11)
RDW: 13.8 % (ref 11.5–15.5)
WBC: 8.9 10*3/uL (ref 4.0–10.5)
nRBC: 0 % (ref 0.0–0.2)

## 2019-05-23 LAB — BASIC METABOLIC PANEL
Anion gap: 10 (ref 5–15)
BUN: 24 mg/dL — ABNORMAL HIGH (ref 8–23)
CO2: 26 mmol/L (ref 22–32)
Calcium: 10.6 mg/dL — ABNORMAL HIGH (ref 8.9–10.3)
Chloride: 115 mmol/L — ABNORMAL HIGH (ref 98–111)
Creatinine, Ser: 0.86 mg/dL (ref 0.44–1.00)
GFR calc Af Amer: >60 mL/min (ref >60)
GFR calc non Af Amer: >60 mL/min (ref >60)
Glucose, Bld: 112 mg/dL — ABNORMAL HIGH (ref 70–99)
Potassium: 4.2 mmol/L (ref 3.5–5.1)
Sodium: 151 mmol/L — ABNORMAL HIGH (ref 135–145)

## 2019-05-23 LAB — MAGNESIUM: Magnesium: 2.3 mg/dL (ref 1.7–2.4)

## 2019-05-23 MED ORDER — DIVALPROEX SODIUM ER 250 MG PO TB24
250.0000 mg | ORAL_TABLET | Freq: Every day | ORAL | Status: DC
  Administered 2019-05-23 – 2019-05-24 (×2): 250 mg via ORAL
  Filled 2019-05-23 (×4): qty 1

## 2019-05-23 MED ORDER — SODIUM CHLORIDE 0.45 % IV SOLN: INTRAVENOUS | Status: AC

## 2019-05-23 MED ORDER — UMECLIDINIUM BROMIDE 62.5 MCG/INH IN AEPB
1.0000 | INHALATION_SPRAY | Freq: Every day | RESPIRATORY_TRACT | Status: DC
  Administered 2019-05-23 – 2019-05-24 (×2): 1 via RESPIRATORY_TRACT
  Filled 2019-05-23: qty 7

## 2019-05-23 MED ORDER — SODIUM CHLORIDE 0.45 % IV SOLN: INTRAVENOUS | Status: DC

## 2019-05-23 NOTE — Progress Notes (Signed)
Alert but speech unintelligible.  Unable to drink from straw but could drink from cup and able to swallow pills that way with no difficulty.  Followed command to squeeze hands, though very weak.

## 2019-05-23 NOTE — Progress Notes (Signed)
BP 174/83, heart rate 66. Midlevel notified. Instructed by Midlevel to give PRN labetalol 10mg  IV.

## 2019-05-23 NOTE — Progress Notes (Signed)
Patient alert and reaching for water. Patient given water and tolerated well. Patient given PO medications with a cup of milk.

## 2019-05-23 NOTE — Progress Notes (Signed)
PROGRESS NOTE    Karen Keller  [id number] DOB: 10/04/1955 DOA: 05/22/2019 PCP: Rosita Fire, MD   Brief Narrative:  Per HPI: Karen Keller is a 64 y.o. female with medical history significant for anxiety/bipolar disorder, COPD with ongoing tobacco abuse, hypertension, chronic constipation, GERD, and CKD stage IIIa, who presented to the ED after she was noted that she could not get up or walk or talk very much and was noted to be urinating on herself.  She is usually quite functional and is able to walk slowly and go outside for smoke.  She was noted to be okay yesterday and there were no other complaints of any fevers, chills, vomiting, diarrhea or any other issues.  There were no sick contacts at the adult assisted living facility where she currently resides.  3/16: Patient was admitted with acute metabolic encephalopathy secondary to UTI and appears to be demonstrating signs of significant improvement with urine cultures pending.  Continue to monitor hypernatremia and use hypotonic IV fluid and recheck labs in a.m.  Assessment & Plan:   Active Problems:   Acute metabolic encephalopathy   Acute metabolic encephalopathy likely secondary to UTI-improving -CT head with no acute findings -Continue treatment and assess for recovery of baseline mentation -Urine cultures ordered and pending -Continue empiric Rocephin -Noted to have history of incontinence issues-continue oxybutynin  CKD stage IIIa -Appears stable with baseline creatinine 0.9-1.1 -Continue to monitor  Hypertension-stable -Continue hydralazine -Labetalol IV as needed  History of COPD with ongoing tobacco abuse -Breathing treatments as needed -Continue Spiriva -Covid testing pending  GERD -Continue home PPI  Bipolar/anxiety disorder -Continue Haldol, Depakote, lithium, and Paxil  Chronic constipation -Continue on Linzess   DVT prophylaxis: Heparin Code Status: Full Family  Communication: None at bedside Disposition Plan: Continue treatment for UTI with urine cultures still pending.  Monitor hypernatremia and use hypotonic IV fluid and recheck labs in a.m.  Anticipate discharge by tomorrow if stable and improved   Consultants:   None  Procedures:   See below  Antimicrobials:  Anti-infectives (From admission, onward)   Start     Dose/Rate Route Frequency Ordered Stop   05/23/19 1500  cefTRIAXone (ROCEPHIN) 1 g in sodium chloride 0.9 % 100 mL IVPB     1 g 200 mL/hr over 30 Minutes Intravenous Every 24 hours 05/22/19 1538     05/22/19 1445  cefTRIAXone (ROCEPHIN) 1 g in sodium chloride 0.9 % 100 mL IVPB     1 g 200 mL/hr over 30 Minutes Intravenous  Once 05/22/19 1436 05/22/19 1631       Subjective: Patient seen and evaluated today with no new acute complaints or concerns. No acute concerns or events noted overnight.  She appears more awake and alert and responsive to questioning.  Objective: Vitals:   05/23/19 0109 05/23/19 0525 05/23/19 0911 05/23/19 0925  BP: (!) 158/93 (!) 152/99  (!) 141/67  Pulse: (!) 57 65  66  Resp: 16 16  16   Temp: 97.9 F (36.6 C) 98.5 F (36.9 C)  98.4 F (36.9 C)  TempSrc: Oral Oral  Oral  SpO2: 96% 96% 94% 98%  Weight:      Height:        Intake/Output Summary (Last 24 hours) at 05/23/2019 1216 Last data filed at 05/23/2019 1100 Gross per 24 hour  Intake 3811.79 ml  Output 600 ml  Net 3211.79 ml   Filed Weights   05/22/19 1823  Weight: 71.8 kg    Examination:  General exam: Appears calm and comfortable  Respiratory system: Clear to auscultation. Respiratory effort normal. Cardiovascular system: S1 & S2 heard, RRR. No JVD, murmurs, rubs, gallops or clicks. No pedal edema. Gastrointestinal system: Abdomen is nondistended, soft and nontender. No organomegaly or masses felt. Normal bowel sounds heard. Central nervous system: Alert and awake.  Responsive to questioning. Extremities: No edema. Skin: No  rashes, lesions or ulcers Psychiatry: Flat affect.    Data Reviewed: I have personally reviewed following labs and imaging studies  CBC: Recent Labs  Lab 05/22/19 1233 05/23/19 0520  WBC 11.0* 8.9  NEUTROABS 7.5  --   HGB 14.4 13.5  HCT 45.9 43.9  MCV 102.9* 104.8*  PLT 239 [id number]   Basic Metabolic Panel: Recent Labs  Lab 05/22/19 1233 05/23/19 0520  NA 147* 151*  K 4.2 4.2  CL 109 115*  CO2 25 26  GLUCOSE 116* 112*  BUN 27* 24*  CREATININE 1.03* 0.86  CALCIUM 11.1* 10.6*  MG  --  2.3   GFR: Estimated Creatinine Clearance: 63.3 mL/min (by C-G formula based on SCr of 0.86 mg/dL). Liver Function Tests: Recent Labs  Lab 05/22/19 1233  AST 25  ALT 26  ALKPHOS 139*  BILITOT 1.1  PROT 8.0  ALBUMIN 4.5   No results for input(s): LIPASE, AMYLASE in the last 168 hours. Recent Labs  Lab 05/22/19 1233  AMMONIA 16   Coagulation Profile: No results for input(s): INR, PROTIME in the last 168 hours. Cardiac Enzymes: No results for input(s): CKTOTAL, CKMB, CKMBINDEX, TROPONINI in the last 168 hours. BNP (last 3 results) No results for input(s): PROBNP in the last 8760 hours. HbA1C: No results for input(s): HGBA1C in the last 72 hours. CBG: No results for input(s): GLUCAP in the last 168 hours. Lipid Profile: No results for input(s): CHOL, HDL, LDLCALC, TRIG, CHOLHDL, LDLDIRECT in the last 72 hours. Thyroid Function Tests: Recent Labs    05/22/19 1233  TSH 1.347   Anemia Panel: No results for input(s): VITAMINB12, FOLATE, FERRITIN, TIBC, IRON, RETICCTPCT in the last 72 hours. Sepsis Labs: No results for input(s): PROCALCITON, LATICACIDVEN in the last 168 hours.  Recent Results (from the past 240 hour(s))  SARS CORONAVIRUS 2 (TAT 6-24 HRS) Nasopharyngeal Nasopharyngeal Swab     Status: None   Collection Time: 05/22/19  2:47 PM   Specimen: Nasopharyngeal Swab  Result Value Ref Range Status   SARS Coronavirus 2 NEGATIVE NEGATIVE Final    Comment:  (NOTE) SARS-CoV-2 target nucleic acids are NOT DETECTED. The SARS-CoV-2 RNA is generally detectable in upper and lower respiratory specimens during the acute phase of infection. Negative results do not preclude SARS-CoV-2 infection, do not rule out co-infections with other pathogens, and should not be used as the sole basis for treatment or other patient management decisions. Negative results must be combined with clinical observations, patient history, and epidemiological information. The expected result is Negative. Fact Sheet for Patients: SugarRoll.be Fact Sheet for Healthcare Providers: https://www.woods-mathews.com/ This test is not yet approved or cleared by the [place] FDA and  has been authorized for detection and/or diagnosis of SARS-CoV-2 by FDA under an Emergency Use Authorization (EUA). This EUA will remain  in effect (meaning this test can be used) for the duration of the COVID-19 declaration under Section 56 4(b)(1) of the Act, 21 U.S.C. section 360bbb-3(b)(1), unless the authorization is terminated or revoked sooner. Performed at [facility], [place] [address] [place], [place] [address]   MRSA PCR Screening  Status: None   Collection Time: 05/22/19  6:41 PM   Specimen: Nasal Mucosa; Nasopharyngeal  Result Value Ref Range Status   MRSA by PCR NEGATIVE NEGATIVE Final    Comment:        The GeneXpert MRSA Assay (FDA approved for NASAL specimens only), is one component of a comprehensive MRSA colonization surveillance program. It is not intended to diagnose MRSA infection nor to guide or monitor treatment for MRSA infections. Performed at [facility], [address], [place], [place] [address]          Radiology Studies: CT Head Wo Contrast  Result Date: 05/22/2019 CLINICAL DATA:  Altered mental status, encephalopathy EXAM: CT HEAD WITHOUT CONTRAST TECHNIQUE: Contiguous axial images were obtained from  the base of the skull through the vertex without intravenous contrast. COMPARISON:  08/20/2018 FINDINGS: Technical note: Examination is mildly degraded by motion artifact. Brain: No evidence of acute infarction, hemorrhage, hydrocephalus, extra-axial collection or mass lesion/mass effect. Scattered low-density changes within the periventricular and subcortical white matter compatible with chronic microvascular ischemic change. Mild diffuse cerebral volume loss. Vascular: No hyperdense vessel or unexpected calcification. Skull: Normal. Negative for fracture or focal lesion. Sinuses/Orbits: No acute finding. Other: None. IMPRESSION: Mildly motion degraded exam. No acute intracranial findings are evident. Electronically Signed   By: Davina Poke D.O.   On: 05/22/2019 12:57        Scheduled Meds: . B-complex with vitamin C  1 tablet Oral Daily  . calcium-vitamin D  1 tablet Oral BID  . divalproex  250 mg Oral Daily  . divalproex  500 mg Oral QHS  . docusate sodium  100 mg Oral Daily  . haloperidol lactate  2 mg Intravenous BID  . heparin  5,000 Units Subcutaneous Q8H  . linaclotide  290 mcg Oral Daily  . lithium carbonate  300 mg Oral q AM  . lithium carbonate  450 mg Oral QHS  . multivitamin with minerals  1 tablet Oral Daily  . oxybutynin  10 mg Oral Daily  . pantoprazole (PROTONIX) IV  40 mg Intravenous Daily  . PARoxetine  10 mg Oral Daily  . PARoxetine  10 mg Oral Daily  . umeclidinium bromide  1 puff Inhalation Daily   Continuous Infusions: . sodium chloride    . cefTRIAXone (ROCEPHIN)  IV       LOS: 1 day    Time spent: 35 minutes    Angelmarie Ponzo Darleen Crocker, DO [facility]  If 7PM-7AM, please contact night-coverage [email or web address] 05/23/2019, 12:17 PM

## 2019-05-23 NOTE — TOC Initial Note (Signed)
Transition of Care [facility]) - Initial/Assessment Note    Patient Details  Name: Karen Keller MRN: [id number] Date of Birth: 18-May-1955  Transition of Care [facility]) CM/SW Contact:    Ihor Gully, LCSW Phone Number: 05/23/2019, 1:38 PM  Clinical Narrative:                 Patient from [facility]. At baseline, patient is independent, ambulates unassisted. Advised of potential discharge on 05/24/19.   Expected Discharge Plan: Group Home(FAMILY CARE HOME) Barriers to Discharge: Continued Medical Work up   Patient Goals and CMS Choice        Expected Discharge Plan and Services Expected Discharge Plan: Group Home(FAMILY CARE HOME)       Living arrangements for the past 2 months: Group Home(Family Care Home)                                      Prior Living Arrangements/Services Living arrangements for the past 2 months: Group Home(Family Care Home) Lives with:: Facility Resident Patient language and need for interpreter reviewed:: Yes        Need for Family Participation in Patient Care: Yes (Comment) Care giver support system in place?: Yes (comment)   Criminal Activity/Legal Involvement Pertinent to Current Situation/Hospitalization: No - Comment as needed  Activities of Daily Living   ADL Screening (condition at time of admission) Patient's cognitive ability adequate to safely complete daily activities?: No Is the patient deaf or have difficulty hearing?: No Does the patient have difficulty seeing, even when wearing glasses/contacts?: No Does the patient have difficulty concentrating, remembering, or making decisions?: Yes Patient able to express need for assistance with ADLs?: No Does the patient have difficulty dressing or bathing?: Yes Independently performs ADLs?: No Communication: Independent Dressing (OT): Needs assistance Is this a change from baseline?: Pre-admission baseline Grooming: Needs assistance Is this a change from  baseline?: Pre-admission baseline Feeding: Needs assistance Is this a change from baseline?: Pre-admission baseline Bathing: Needs assistance Is this a change from baseline?: Pre-admission baseline Toileting: Needs assistance Is this a change from baseline?: Pre-admission baseline In/Out Bed: Needs assistance Is this a change from baseline?: Pre-admission baseline Walks in Home: Needs assistance Is this a change from baseline?: Pre-admission baseline Does the patient have difficulty walking or climbing stairs?: Yes Weakness of Legs: Both Weakness of Arms/Hands: Both  Permission Sought/Granted Permission sought to share information with : Other (comment)          Permission granted to share info w Relationship: Carin Primrose, [facility]     Emotional Assessment Appearance:: Appears younger than stated age   Affect (typically observed): Appropriate   Alcohol / Substance Use: Not Applicable Psych Involvement: No (comment)  Admission diagnosis:  Acute encephalopathy [G93.40] Urinary tract infection without hematuria, site unspecified [id number] Acute metabolic encephalopathy [id number] Patient Active Problem List   Diagnosis Date Noted  . Acute metabolic encephalopathy [id number]  . History of hepatitis C 02/14/2019  . Schizoaffective disorder, bipolar type ([place]) 01/16/2019  . GERD (gastroesophageal reflux disease) 07/28/2018  . Schizoaffective disorder ([place]) 04/11/2018  . MDD (major depressive disorder), severe ([place]) 04/06/2018  . Macrocytosis 02/16/2018  . COPD (chronic obstructive pulmonary disease) ([place]) 02/16/2018  . Bipolar disorder ([place]) 02/16/2018  . HTN (hypertension) 02/16/2018  . H/O adenomatous polyp of colon   . Chronic hepatitis C without hepatic coma ([place]) 11/12/2017  . Abnormal LFTs 06/15/2017  .  Constipation 06/15/2017  . Anemia 03/24/2017  . Bilateral lower extremity edema 01/01/2016  . Encounter for screening colonoscopy 01/01/2016  . Macrocytic anemia  01/01/2016  . Major depressive disorder, single episode 03/26/2015   PCP:  Rosita Fire, MD Pharmacy:   [facility], [place] [address] [place] [address] Phone: [phone] Fax: [phone]     Social Determinants of Health (SDOH) Interventions    Readmission Risk Interventions No flowsheet data found.

## 2019-05-24 LAB — BASIC METABOLIC PANEL
Anion gap: 8 (ref 5–15)
BUN: 19 mg/dL (ref 8–23)
CO2: 28 mmol/L (ref 22–32)
Calcium: 10.4 mg/dL — ABNORMAL HIGH (ref 8.9–10.3)
Chloride: 108 mmol/L (ref 98–111)
Creatinine, Ser: 1.03 mg/dL — ABNORMAL HIGH (ref 0.44–1.00)
GFR calc Af Amer: >60 mL/min (ref >60)
GFR calc non Af Amer: 57 mL/min — ABNORMAL LOW (ref >60)
Glucose, Bld: 144 mg/dL — ABNORMAL HIGH (ref 70–99)
Potassium: 3.9 mmol/L (ref 3.5–5.1)
Sodium: 144 mmol/L (ref 135–145)

## 2019-05-24 LAB — CBC
HCT: 41.7 % (ref 36.0–46.0)
Hemoglobin: 12.8 g/dL (ref 12.0–15.0)
MCH: 32.3 pg (ref 26.0–34.0)
MCHC: 30.7 g/dL (ref 30.0–36.0)
MCV: 105.3 fL — ABNORMAL HIGH (ref 80.0–100.0)
Platelets: 207 10*3/uL (ref 150–400)
RBC: 3.96 MIL/uL (ref 3.87–5.11)
RDW: 13.8 % (ref 11.5–15.5)
WBC: 7.9 10*3/uL (ref 4.0–10.5)
nRBC: 0 % (ref 0.0–0.2)

## 2019-05-24 LAB — URINE CULTURE: Culture: >=100000 — AB

## 2019-05-24 MED ORDER — FOSFOMYCIN TROMETHAMINE 3 G PO PACK
3.0000 g | PACK | Freq: Once | ORAL | Status: AC
  Administered 2019-05-24: 3 g via ORAL
  Filled 2019-05-24: qty 3

## 2019-05-24 NOTE — Evaluation (Signed)
Physical Therapy Evaluation Patient Details Name: [name] MRN: [id number] DOB: [birth year] Today's Date: 05/24/2019   History of Present Illness  Karen Keller is a 64 y.o. female with medical history significant for anxiety/bipolar disorder, COPD with ongoing tobacco abuse, hypertension, chronic constipation, GERD, and CKD stage IIIa, who presented to the ED after she was noted that she could not get up or walk or talk very much and was noted to be urinating on herself.  She is usually quite functional and is able to walk slowly and go outside for smoke.  She was noted to be okay yesterday and there were no other complaints of any fevers, chills, vomiting, diarrhea or any other issues.  There were no sick contacts at the adult assisted living facility where she currently resides.    Clinical Impression  Patient demonstrates slow labored movement for sitting up at bedside requiring Min assistance, had difficulty with sit to stands, transfers and limited to a few steps at bedside due to poor standing balance and generalized weakness with near fall when making turn to transfer to chair.  Patient tolerated sitting up in chair after therapy - RN notified.  Patient will benefit from continued physical therapy in hospital and recommended venue below to increase strength, balance, endurance for safe ADLs and gait.     Follow Up Recommendations SNF;Supervision for mobility/OOB;Supervision - Intermittent    Equipment Recommendations  Rolling walker with 5" wheels    Recommendations for Other Services       Precautions / Restrictions Precautions Precautions: Fall Restrictions Weight Bearing Restrictions: No      Mobility  Bed Mobility Overal bed mobility: Needs Assistance Bed Mobility: Supine to Sit     Supine to sit: Min assist     General bed mobility comments: slow labored movement  Transfers Overall transfer level: Needs assistance Equipment used:  Rolling walker (2 wheeled) Transfers: Sit to/from [facility] Sit to Stand: Mod assist Stand pivot transfers: Mod assist;Max assist       General transfer comment: very unsteady with near fall during transfer to chair  Ambulation/Gait Ambulation/Gait assistance: Mod assist;Max assist Gait Distance (Feet): 4 Feet Assistive device: Rolling walker (2 wheeled) Gait Pattern/deviations: Decreased step length - right;Decreased step length - left;Decreased stride length;Trunk flexed Gait velocity: slow   General Gait Details: limited to 4-5 slow labored unsteady steps with near fall due to BLE weakness and inability to fully extend trunk  Stairs            Wheelchair Mobility    Modified Rankin (Stroke Patients Only)       Balance Overall balance assessment: Needs assistance Sitting-balance support: Feet supported;No upper extremity supported Sitting balance-Leahy Scale: Fair Sitting balance - Comments: seated at EOB   Standing balance support: During functional activity;Bilateral upper extremity supported Standing balance-Leahy Scale: Poor Standing balance comment: fair/poor using RW                             Pertinent Vitals/Pain Pain Assessment: No/denies pain    Home Living Family/patient expects to be discharged to:: Assisted living               Home Equipment: None Additional Comments: Patient states she has no AD's, (may be poor historian?)    Prior Function Level of Independence: Needs assistance   Gait / Transfers Assistance Needed: Household ambulator without AD  ADL's / Homemaking Assistance Needed: assisted by ALF  staff  Comments: Patient unable to answer most questions due to poor memory     Hand Dominance        Extremity/Trunk Assessment   Upper Extremity Assessment Upper Extremity Assessment: Generalized weakness    Lower Extremity Assessment Lower Extremity Assessment: Generalized weakness     Cervical / Trunk Assessment Cervical / Trunk Assessment: Kyphotic  Communication   Communication: No difficulties  Cognition Arousal/Alertness: Awake/alert Behavior During Therapy: WFL for tasks assessed/performed Overall Cognitive Status: Impaired/Different from baseline Area of Impairment: Memory                               General Comments: Initially patient unable to remeber PLOF, but after getting up, stated she does not use an AD at home to walk      General Comments      Exercises     Assessment/Plan    PT Assessment Patient needs continued PT services  PT Problem List Decreased strength;Decreased activity tolerance;Decreased balance;Decreased mobility       PT Treatment Interventions Gait training;Stair training;Balance training;Functional mobility training;Therapeutic activities;Therapeutic exercise;Patient/family education    PT Goals (Current goals can be found in the Care Plan section)  Acute Rehab PT Goals Patient Stated Goal: return home PT Goal Formulation: With patient Time For Goal Achievement: 06/07/19 Potential to Achieve Goals: Good    Frequency Min 3X/week   Barriers to discharge        Co-evaluation               AM-PAC PT "6 Clicks" Mobility  Outcome Measure Help needed turning from your back to your side while in a flat bed without using bedrails?: A Little Help needed moving from lying on your back to sitting on the side of a flat bed without using bedrails?: A Little Help needed moving to and from a bed to a chair (including a wheelchair)?: A Lot Help needed standing up from a chair using your arms (e.g., wheelchair or bedside chair)?: A Lot Help needed to walk in hospital room?: A Lot Help needed climbing 3-5 steps with a railing? : Total 6 Click Score: 13    End of Session   Activity Tolerance: Patient tolerated treatment well;Patient limited by fatigue Patient left: in chair;with call bell/phone within  reach;with chair alarm set Nurse Communication: Mobility status PT Visit Diagnosis: Unsteadiness on feet (R26.81);Other abnormalities of gait and mobility (R26.89);Muscle weakness (generalized) (M62.81)    Time: [id number] PT Time Calculation (min) (ACUTE ONLY): 27 min   Charges:   PT Evaluation $PT Eval Moderate Complexity: 1 Mod PT Treatments $Therapeutic Activity: 23-37 mins        9:53 AM, 05/24/19 Lonell Grandchild, MPT Physical Therapist with [facility] [phone] office [phone] mobile phone

## 2019-05-24 NOTE — Plan of Care (Signed)
  Problem: Acute Rehab PT Goals(only PT should resolve) Goal: Pt Will Go Supine/Side To Sit Outcome: Progressing Flowsheets (Taken 05/24/2019 0955) Pt will go Supine/Side to Sit:  with min guard assist  with supervision Goal: Patient Will Transfer Sit To/From Stand Outcome: Progressing Flowsheets (Taken 05/24/2019 0955) Patient will transfer sit to/from stand: with minimal assist Goal: Pt Will Transfer Bed To Chair/Chair To Bed Outcome: Progressing Flowsheets (Taken 05/24/2019 0955) Pt will Transfer Bed to Chair/Chair to Bed: with min assist Goal: Pt Will Ambulate Outcome: Progressing Flowsheets (Taken 05/24/2019 0955) Pt will Ambulate:  25 feet  with minimal assist  with moderate assist  with rolling walker   9:55 AM, 05/24/19 Lonell Grandchild, MPT Physical Therapist with [facility] [phone] office [phone] mobile phone

## 2019-05-24 NOTE — TOC Transition Note (Signed)
Transition of Care [facility]) - CM/SW Discharge Note   Patient Details  Name: Erva Toby Andros MRN: [id number] Date of Birth: 22-Jul-1955  Transition of Care [facility]) CM/SW Contact:  Ihor Gully, LCSW Phone Number: 05/24/2019, 3:18 PM   Clinical Narrative:    PT evaluation discussed with [name] of [facility]. Mrs. Huel Cote is agreeable to accept patient back at facility as has ambulatory, semi-ambulatory and non-ambulatory facility capability.  Advised of discharge. RW delivered to room.  TOC Signing off.    Final next level of care: Group Home([facility]) Barriers to Discharge: No Barriers Identified   Patient Goals and CMS Choice        Discharge Placement              Patient chooses bed at: [facility] Patient to be transferred to facility by: facility staff Name of family member notified: [name], Facility owner/administrator Patient and family notified of of transfer: 05/24/19  Discharge Plan and Services                DME Arranged: Gilford Rile rolling DME Agency: [employer] Date DME Agency Contacted: 05/24/19 Time DME Agency Contacted: [phone] Representative spoke with at DME Agency: [place]: PT [place]: [place] Date [place]: 05/24/19 Time [place]: 1518 Representative spoke with at [place]: [place] ([place]) Interventions     Readmission Risk Interventions No flowsheet data found.

## 2019-05-24 NOTE — Progress Notes (Signed)
Has not voided this afternoon though drinking a lot.  Assisted to bedside commode with max assist and could not turn and pivot on own.  Did not void.  Assisted to bed with max assist and bladder scanned and showed 430.  Communicated this information with Dr. Manuella Ghazi and received order for in n out.  Called Beverly at [facility]  with this information and they will still come to pick her up in about an hour.

## 2019-05-24 NOTE — Discharge Summary (Signed)
Physician Discharge Summary  Karen Keller [id number] DOB: 1955/12/08 DOA: 05/22/2019  PCP: Rosita Fire, MD  Admit date: 05/22/2019  Discharge date: 05/24/2019  Admitted From:Group home  Disposition:  Group home  Recommendations for Outpatient Follow-up:  1. Follow up with PCP in 1-2 weeks 2. Continue home medications as prior  Home Health: None  Equipment/Devices: None  Discharge Condition: Stable  CODE STATUS: Full  Diet recommendation: Heart Healthy  Brief/Interim Summary: Per HPI: Karen Keller a 64 y.o.femalewith medical history significant foranxiety/bipolar disorder, COPD with ongoing tobacco abuse, hypertension, chronic constipation, GERD, and CKD stage IIIa, who presented to the ED after she was noted that she could not get up or walk or talk very much and was noted to be urinating on herself. She is usually quite functional and is able to walk slowly and go outside for smoke. She was noted to be okay yesterday and there were no other complaints of any fevers, chills, vomiting, diarrhea or any other issues. There were no sick contacts at the adult assisted living facility where she currently resides.  3/16: Patient was admitted with acute metabolic encephalopathy secondary to UTI and appears to be demonstrating signs of significant improvement with urine cultures pending.  Continue to monitor hypernatremia and use hypotonic IV fluid and recheck labs in a.m.  3/17: Patient has had improvement in her encephalopathic state and she is back to her baseline level of functioning.  She is noted to have Klebsiella UTI and will be given fosfomycin today.  She had been treated with Rocephin over the past 2 days which her infection was susceptible to.  She has been seen by physical therapy with recommendations for rehabilitation, however it appears that her facility would be able to manage this rehabilitation process.  Discharge Diagnoses:  Active  Problems:   Acute metabolic encephalopathy  Principal discharge diagnosis: Acute metabolic encephalopathy secondary to Klebsiella UTI.  Discharge Instructions  Discharge Instructions    Diet - low sodium heart healthy   Complete by: As directed    Increase activity slowly   Complete by: As directed      Allergies as of 05/24/2019      Reactions   Neurontin [gabapentin] Other (See Comments)   Unknown reaction; itching   Septra Ds [sulfamethoxazole-trimethoprim] Other (See Comments)   Unknown reaction; itching      Medication List    TAKE these medications   acetaminophen 325 MG tablet Commonly known as: TYLENOL Take 3 tablets by mouth every 8 (eight) hours.   albuterol 108 (90 Base) MCG/ACT inhaler Commonly known as: VENTOLIN HFA Inhale 1 puff into the lungs every 6 (six) hours as needed for wheezing or shortness of breath.   B-complex with vitamin C tablet Take 1 tablet by mouth daily.   calcium-vitamin D 500-200 MG-UNIT Tabs tablet Commonly known as: OSCAL WITH D Take 1 tablet by mouth 2 (two) times daily.   divalproex 500 MG 24 hr tablet Commonly known as: DEPAKOTE ER Take 1 tablet (500 mg total) by mouth at bedtime.   divalproex 250 MG 24 hr tablet Commonly known as: DEPAKOTE ER Take 1 tablet (250 mg total) by mouth every morning.   docusate sodium 100 MG capsule Commonly known as: COLACE Take 100 mg by mouth daily.   haloperidol 2 MG tablet Commonly known as: HALDOL Take 1 tablet (2 mg total) by mouth 2 (two) times daily.   hydrALAZINE 25 MG tablet Commonly known as: APRESOLINE Take 1 tablet (25 mg total)  by mouth 4 (four) times daily.   Linzess 290 MCG Caps capsule Generic drug: linaclotide Take 290 mcg by mouth daily.   lithium carbonate 300 MG capsule Take 300 mg by mouth in the morning.   lithium carbonate 150 MG capsule Take 3 capsules by mouth at bedtime.   omeprazole 40 MG capsule Commonly known as: PRILOSEC Take 40 mg by mouth in the  morning.   ONE-A-DAY WOMENS PO Take 1 tablet by mouth daily.   oxybutynin 10 MG 24 hr tablet Commonly known as: DITROPAN-XL Take 10 mg by mouth daily.   PARoxetine 10 MG tablet Commonly known as: PAXIL Take 1 tablet (10 mg total) by mouth daily.   Spiriva HandiHaler 18 MCG inhalation capsule Generic drug: tiotropium Place 1 capsule into inhaler and inhale daily.   traMADol 50 MG tablet Commonly known as: ULTRAM Take 50 mg by mouth 2 (two) times daily.      Follow-up Information    Rosita Fire, MD Follow up in 1 week(s).   Specialty: Internal Medicine Contact information: [place] [address] [phone]          Allergies  Allergen Reactions  . Neurontin [Gabapentin] Other (See Comments)    Unknown reaction; itching  . Septra Ds [Sulfamethoxazole-Trimethoprim] Other (See Comments)    Unknown reaction; itching    Consultations:  None   Procedures/Studies: CT Head Wo Contrast  Result Date: 05/22/2019 CLINICAL DATA:  Altered mental status, encephalopathy EXAM: CT HEAD WITHOUT CONTRAST TECHNIQUE: Contiguous axial images were obtained from the base of the skull through the vertex without intravenous contrast. COMPARISON:  08/20/2018 FINDINGS: Technical note: Examination is mildly degraded by motion artifact. Brain: No evidence of acute infarction, hemorrhage, hydrocephalus, extra-axial collection or mass lesion/mass effect. Scattered low-density changes within the periventricular and subcortical white matter compatible with chronic microvascular ischemic change. Mild diffuse cerebral volume loss. Vascular: No hyperdense vessel or unexpected calcification. Skull: Normal. Negative for fracture or focal lesion. Sinuses/Orbits: No acute finding. Other: None. IMPRESSION: Mildly motion degraded exam. No acute intracranial findings are evident. Electronically Signed   By: Davina Poke D.O.   On: 05/22/2019 12:57     Discharge Exam: Vitals:    05/24/19 0624 05/24/19 0735  BP: (!) 163/87   Pulse: 65   Resp: 20   Temp: 98 F (36.7 C)   SpO2: 97% 97%   Vitals:   05/23/19 2025 05/23/19 2141 05/24/19 0624 05/24/19 0735  BP:  (!) 158/80 (!) 163/87   Pulse:  64 65   Resp:  18 20   Temp:  98.8 F (37.1 C) 98 F (36.7 C)   TempSrc:  Oral Oral   SpO2: 98% 99% 97% 97%  Weight:      Height:        General: Pt is alert, awake, not in acute distress Cardiovascular: RRR, S1/S2 +, no rubs, no gallops Respiratory: CTA bilaterally, no wheezing, no rhonchi Abdominal: Soft, NT, ND, bowel sounds + Extremities: no edema, no cyanosis    The results of significant diagnostics from this hospitalization (including imaging, microbiology, ancillary and laboratory) are listed below for reference.     Microbiology: Recent Results (from the past 240 hour(s))  Urine Culture     Status: Abnormal   Collection Time: 05/22/19  1:26 PM   Specimen: Urine, Clean Catch  Result Value Ref Range Status   Specimen Description   Final    URINE, CLEAN CATCH Performed at [facility], [address],  [place], [place] [address]    Special Requests   Final    NONE Performed at [facility], [address], [place], [place] [address]    Culture >=100,000 COLONIES/mL KLEBSIELLA PNEUMONIAE (A)  Final   Report Status 05/24/2019 FINAL  Final   Organism ID, Bacteria KLEBSIELLA PNEUMONIAE (A)  Final      Susceptibility   Klebsiella pneumoniae - MIC*    AMPICILLIN RESISTANT Resistant     CEFAZOLIN <=4 SENSITIVE Sensitive     CEFTRIAXONE <=0.25 SENSITIVE Sensitive     CIPROFLOXACIN <=0.25 SENSITIVE Sensitive     GENTAMICIN <=1 SENSITIVE Sensitive     IMIPENEM <=0.25 SENSITIVE Sensitive     NITROFURANTOIN 64 INTERMEDIATE Intermediate     TRIMETH/SULFA <=20 SENSITIVE Sensitive     AMPICILLIN/SULBACTAM 4 SENSITIVE Sensitive     PIP/TAZO <=4 SENSITIVE Sensitive     * >=100,000 COLONIES/mL KLEBSIELLA PNEUMONIAE  SARS CORONAVIRUS 2 (TAT 6-24 HRS)  Nasopharyngeal Nasopharyngeal Swab     Status: None   Collection Time: 05/22/19  2:47 PM   Specimen: Nasopharyngeal Swab  Result Value Ref Range Status   SARS Coronavirus 2 NEGATIVE NEGATIVE Final    Comment: (NOTE) SARS-CoV-2 target nucleic acids are NOT DETECTED. The SARS-CoV-2 RNA is generally detectable in upper and lower respiratory specimens during the acute phase of infection. Negative results do not preclude SARS-CoV-2 infection, do not rule out co-infections with other pathogens, and should not be used as the sole basis for treatment or other patient management decisions. Negative results must be combined with clinical observations, patient history, and epidemiological information. The expected result is Negative. Fact Sheet for Patients: SugarRoll.be Fact Sheet for Healthcare Providers: https://www.woods-mathews.com/ This test is not yet approved or cleared by the [place] FDA and  has been authorized for detection and/or diagnosis of SARS-CoV-2 by FDA under an Emergency Use Authorization (EUA). This EUA will remain  in effect (meaning this test can be used) for the duration of the COVID-19 declaration under Section 56 4(b)(1) of the Act, 21 U.S.C. section 360bbb-3(b)(1), unless the authorization is terminated or revoked sooner. Performed at [place] [facility], [place] [address], [place], [place] [address]   MRSA PCR Screening     Status: None   Collection Time: 05/22/19  6:41 PM   Specimen: Nasal Mucosa; Nasopharyngeal  Result Value Ref Range Status   MRSA by PCR NEGATIVE NEGATIVE Final    Comment:        The GeneXpert MRSA Assay (FDA approved for NASAL specimens only), is one component of a comprehensive MRSA colonization surveillance program. It is not intended to diagnose MRSA infection nor to guide or monitor treatment for MRSA infections. Performed at [facility], [address], [place],  [address]       Labs: BNP (last 3 results) No results for input(s): BNP in the last 8760 hours. Basic Metabolic Panel: Recent Labs  Lab 05/22/19 1233 05/23/19 0520 05/24/19 0727  NA 147* 151* 144  K 4.2 4.2 3.9  CL 109 115* 108  CO2 25 26 28   GLUCOSE 116* 112* 144*  BUN 27* 24* 19  CREATININE 1.03* 0.86 1.03*  CALCIUM 11.1* 10.6* 10.4*  MG  --  2.3  --    Liver Function Tests: Recent Labs  Lab 05/22/19 1233  AST 25  ALT 26  ALKPHOS 139*  BILITOT 1.1  PROT 8.0  ALBUMIN 4.5   No results for input(s): LIPASE, AMYLASE in the last 168 hours. Recent Labs  Lab 05/22/19  1233  AMMONIA 16   CBC: Recent Labs  Lab 05/22/19 1233 05/23/19 0520 05/24/19 0727  WBC 11.0* 8.9 7.9  NEUTROABS 7.5  --   --   HGB 14.4 13.5 12.8  HCT 45.9 43.9 41.7  MCV 102.9* 104.8* 105.3*  PLT 239 213 207   Cardiac Enzymes: No results for input(s): CKTOTAL, CKMB, CKMBINDEX, TROPONINI in the last 168 hours. BNP: Invalid input(s): POCBNP CBG: No results for input(s): GLUCAP in the last 168 hours. D-Dimer No results for input(s): DDIMER in the last 72 hours. Hgb A1c No results for input(s): HGBA1C in the last 72 hours. Lipid Profile No results for input(s): CHOL, HDL, LDLCALC, TRIG, CHOLHDL, LDLDIRECT in the last 72 hours. Thyroid function studies Recent Labs    05/22/19 1233  TSH 1.347   Anemia work up No results for input(s): VITAMINB12, FOLATE, FERRITIN, TIBC, IRON, RETICCTPCT in the last 72 hours. Urinalysis    Component Value Date/Time   COLORURINE YELLOW 05/22/2019 1326   APPEARANCEUR HAZY (A) 05/22/2019 1326   LABSPEC 1.016 05/22/2019 1326   PHURINE 6.0 05/22/2019 1326   GLUCOSEU NEGATIVE 05/22/2019 1326   HGBUR SMALL (A) 05/22/2019 1326   BILIRUBINUR NEGATIVE 05/22/2019 1326   KETONESUR NEGATIVE 05/22/2019 1326   PROTEINUR NEGATIVE 05/22/2019 1326   NITRITE POSITIVE (A) 05/22/2019 1326   LEUKOCYTESUR MODERATE (A) 05/22/2019 1326   Sepsis Labs Invalid input(s): PROCALCITONIN,   WBC,  LACTICIDVEN Microbiology Recent Results (from the past 240 hour(s))  Urine Culture     Status: Abnormal   Collection Time: 05/22/19  1:26 PM   Specimen: Urine, Clean Catch  Result Value Ref Range Status   Specimen Description   Final    URINE, CLEAN CATCH Performed at [facility], [address], [place], [place] [address]    Special Requests   Final    NONE Performed at [facility], [address], [place], [place] [address]    Culture >=100,000 COLONIES/mL KLEBSIELLA PNEUMONIAE (A)  Final   Report Status 05/24/2019 FINAL  Final   Organism ID, Bacteria KLEBSIELLA PNEUMONIAE (A)  Final      Susceptibility   Klebsiella pneumoniae - MIC*    AMPICILLIN RESISTANT Resistant     CEFAZOLIN <=4 SENSITIVE Sensitive     CEFTRIAXONE <=0.25 SENSITIVE Sensitive     CIPROFLOXACIN <=0.25 SENSITIVE Sensitive     GENTAMICIN <=1 SENSITIVE Sensitive     IMIPENEM <=0.25 SENSITIVE Sensitive     NITROFURANTOIN 64 INTERMEDIATE Intermediate     TRIMETH/SULFA <=20 SENSITIVE Sensitive     AMPICILLIN/SULBACTAM 4 SENSITIVE Sensitive     PIP/TAZO <=4 SENSITIVE Sensitive     * >=100,000 COLONIES/mL KLEBSIELLA PNEUMONIAE  SARS CORONAVIRUS 2 (TAT 6-24 HRS) Nasopharyngeal Nasopharyngeal Swab     Status: None   Collection Time: 05/22/19  2:47 PM   Specimen: Nasopharyngeal Swab  Result Value Ref Range Status   SARS Coronavirus 2 NEGATIVE NEGATIVE Final    Comment: (NOTE) SARS-CoV-2 target nucleic acids are NOT DETECTED. The SARS-CoV-2 RNA is generally detectable in upper and lower respiratory specimens during the acute phase of infection. Negative results do not preclude SARS-CoV-2 infection, do not rule out co-infections with other pathogens, and should not be used as the sole basis for treatment or other patient management decisions. Negative results must be combined with clinical observations, patient history, and epidemiological information. The expected result is Negative. Fact Sheet for  Patients: SugarRoll.be Fact Sheet for Healthcare Providers: https://www.woods-mathews.com/ This test is not yet approved or cleared by  the [employer] and  has been authorized for detection and/or diagnosis of SARS-CoV-2 by FDA under an Emergency Use Authorization (EUA). This EUA will remain  in effect (meaning this test can be used) for the duration of the COVID-19 declaration under Section 56 4(b)(1) of the Act, 21 U.S.C. section 360bbb-3(b)(1), unless the authorization is terminated or revoked sooner. Performed at [facility], [place] [address] [place], [place] [address]   MRSA PCR Screening     Status: None   Collection Time: 05/22/19  6:41 PM   Specimen: Nasal Mucosa; Nasopharyngeal  Result Value Ref Range Status   MRSA by PCR NEGATIVE NEGATIVE Final    Comment:        The GeneXpert MRSA Assay (FDA approved for NASAL specimens only), is one component of a comprehensive MRSA colonization surveillance program. It is not intended to diagnose MRSA infection nor to guide or monitor treatment for MRSA infections. Performed at [facility], [address] [place], [place] [address]      Time coordinating discharge: 35 minutes  SIGNED:   Rodena Goldmann, DO [facility] 05/24/2019, 12:43 PM  If 7PM-7AM, please contact night-coverage [email or web address]

## 2019-05-24 NOTE — Progress Notes (Signed)
Able to speak clearly this morning and said first name but not last name.  Answered that she was at [facility] but could not say date or what year it is

## 2019-05-24 NOTE — NC FL2 (Signed)
[place] LEVEL OF CARE SCREENING TOOL     IDENTIFICATION  Patient Name: Karen Keller Birthdate: [birth year] Sex: female Admission Date (Current Location): 05/22/2019  [facility] and [place] Number:  [employer] and Address:  [place] [address] [place]      Provider Number: [id number]  Attending Physician Name and Address:  Rodena Goldmann, DO  Relative Name and Phone Number:       Current Level of Care: Hospital Recommended Level of Care: [facility] Prior Approval Number:    Date Approved/Denied:   PASRR Number:    Discharge Plan: Domiciliary (Rest home)    Current Diagnoses: Patient Active Problem List   Diagnosis Date Noted  . Acute metabolic encephalopathy [id number]  . History of hepatitis C 02/14/2019  . Schizoaffective disorder, bipolar type ([place]) 01/16/2019  . GERD (gastroesophageal reflux disease) 07/28/2018  . Schizoaffective disorder ([place]) 04/11/2018  . MDD (major depressive disorder), severe ([place]) 04/06/2018  . Macrocytosis 02/16/2018  . COPD (chronic obstructive pulmonary disease) ([place]) 02/16/2018  . Bipolar disorder ([place]) 02/16/2018  . HTN (hypertension) 02/16/2018  . H/O adenomatous polyp of colon   . Chronic hepatitis C without hepatic coma ([place]) 11/12/2017  . Abnormal LFTs 06/15/2017  . Constipation 06/15/2017  . Anemia 03/24/2017  . Bilateral lower extremity edema 01/01/2016  . Encounter for screening colonoscopy 01/01/2016  . Macrocytic anemia 01/01/2016  . Major depressive disorder, single episode 03/26/2015    Orientation RESPIRATION BLADDER Height & Weight     Self  Normal Incontinent Weight: 158 lb 4.6 oz (71.8 kg) Height:  5\' 4"  (162.6 cm)  BEHAVIORAL SYMPTOMS/MOOD NEUROLOGICAL BOWEL NUTRITION STATUS      Continent Diet(heart healthy)  AMBULATORY STATUS COMMUNICATION OF NEEDS Skin   Limited Assist Verbally Normal                       Personal Care  Assistance Level of Assistance  Bathing, Feeding, Dressing Bathing Assistance: Limited assistance Feeding assistance: Independent Dressing Assistance: Limited assistance     Functional Limitations Info  Sight, Hearing, Speech Sight Info: Adequate Hearing Info: Adequate Speech Info: Adequate    SPECIAL CARE FACTORS FREQUENCY  PT (By licensed PT)     PT Frequency: 3x/week              Contractures Contractures Info: Present    Additional Factors Info  Code Status, Allergies, Psychotropic Code Status Info: Full Code Allergies Info: Neurontin, Septra Ds Psychotropic Info: Depakote, Haldol         Current Medications (05/24/2019):  This is the current hospital active medication list Current Facility-Administered Medications  Medication Dose Route Frequency Provider Last Rate Last Admin  . acetaminophen (TYLENOL) tablet 650 mg  650 mg Oral Q6H PRN Manuella Ghazi, Pratik D, DO       Or  . acetaminophen (TYLENOL) suppository 650 mg  650 mg Rectal Q6H PRN Manuella Ghazi, Pratik D, DO      . albuterol (PROVENTIL) (2.5 MG/3ML) 0.083% nebulizer solution 2.5 mg  2.5 mg Inhalation Q6H PRN Manuella Ghazi, Pratik D, DO      . B-complex with vitamin C tablet 1 tablet  1 tablet Oral Daily Manuella Ghazi, Pratik D, DO   1 tablet at 05/24/19 0854  . calcium-vitamin D (OSCAL WITH D) 500-200 MG-UNIT per tablet 1 tablet  1 tablet Oral BID Manuella Ghazi, Pratik D, DO   1 tablet at 05/24/19 0854  . divalproex (DEPAKOTE ER) 24  hr tablet 250 mg  250 mg Oral Daily Manuella Ghazi, Pratik D, DO   250 mg at 05/24/19 [id number]  . divalproex (DEPAKOTE ER) 24 hr tablet 500 mg  500 mg Oral QHS Shah, Pratik D, DO   500 mg at 05/23/19 2148  . docusate sodium (COLACE) capsule 100 mg  100 mg Oral Daily Manuella Ghazi, Pratik D, DO   100 mg at 05/24/19 0855  . haloperidol lactate (HALDOL) injection 2 mg  2 mg Intravenous BID Lang Snow, FNP   2 mg at 05/24/19 0855  . heparin injection 5,000 Units  5,000 Units Subcutaneous Q8H Shah, Pratik D, DO   5,000 Units at 05/24/19 [id number]  .  labetalol (NORMODYNE) injection 10 mg  10 mg Intravenous Q2H PRN Heath Lark D, DO   10 mg at 05/22/19 2347  . linaclotide (LINZESS) capsule 290 mcg  290 mcg Oral Daily Heath Lark D, DO   290 mcg at 05/24/19 0855  . lithium carbonate capsule 300 mg  300 mg Oral q AM Manuella Ghazi, Pratik D, DO   300 mg at 05/24/19 [id number]  . lithium carbonate capsule 450 mg  450 mg Oral QHS Shah, Pratik D, DO   450 mg at 05/23/19 2148  . multivitamin with minerals tablet 1 tablet  1 tablet Oral Daily Manuella Ghazi, Pratik D, DO   1 tablet at 05/24/19 0854  . ondansetron (ZOFRAN) tablet 4 mg  4 mg Oral Q6H PRN Manuella Ghazi, Pratik D, DO       Or  . ondansetron (ZOFRAN) injection 4 mg  4 mg Intravenous Q6H PRN Manuella Ghazi, Pratik D, DO      . oxybutynin (DITROPAN-XL) 24 hr tablet 10 mg  10 mg Oral Daily Manuella Ghazi, Pratik D, DO   10 mg at 05/24/19 0854  . pantoprazole (PROTONIX) injection 40 mg  40 mg Intravenous Daily Lang Snow, FNP   40 mg at 05/24/19 0853  . PARoxetine (PAXIL) tablet 10 mg  10 mg Oral Daily Manuella Ghazi, Pratik D, DO      . PARoxetine (PAXIL) tablet 10 mg  10 mg Oral Daily Manuella Ghazi, Pratik D, DO   10 mg at 05/24/19 0855  . umeclidinium bromide (INCRUSE ELLIPTA) 62.5 MCG/INH 1 puff  1 puff Inhalation Daily Manuella Ghazi, Pratik D, DO   1 puff at 05/24/19 0735     Discharge Medications: Medication List    TAKE these medications   acetaminophen 325 MG tablet Commonly known as: TYLENOL Take 3 tablets by mouth every 8 (eight) hours.   albuterol 108 (90 Base) MCG/ACT inhaler Commonly known as: VENTOLIN HFA Inhale 1 puff into the lungs every 6 (six) hours as needed for wheezing or shortness of breath.   B-complex with vitamin C tablet Take 1 tablet by mouth daily.   calcium-vitamin D 500-200 MG-UNIT Tabs tablet Commonly known as: OSCAL WITH D Take 1 tablet by mouth 2 (two) times daily.   divalproex 500 MG 24 hr tablet Commonly known as: DEPAKOTE ER Take 1 tablet (500 mg total) by mouth at bedtime.   divalproex 250 MG 24 hr  tablet Commonly known as: DEPAKOTE ER Take 1 tablet (250 mg total) by mouth every morning.   docusate sodium 100 MG capsule Commonly known as: COLACE Take 100 mg by mouth daily.   haloperidol 2 MG tablet Commonly known as: HALDOL Take 1 tablet (2 mg total) by mouth 2 (two) times daily.   hydrALAZINE 25 MG tablet Commonly known as: APRESOLINE Take 1 tablet (25 mg total) by mouth 4 (  four) times daily.   Linzess 290 MCG Caps capsule Generic drug: linaclotide Take 290 mcg by mouth daily.   lithium carbonate 300 MG capsule Take 300 mg by mouth in the morning.   lithium carbonate 150 MG capsule Take 3 capsules by mouth at bedtime.   omeprazole 40 MG capsule Commonly known as: PRILOSEC Take 40 mg by mouth in the morning.   ONE-A-DAY WOMENS PO Take 1 tablet by mouth daily.   oxybutynin 10 MG 24 hr tablet Commonly known as: DITROPAN-XL Take 10 mg by mouth daily.   PARoxetine 10 MG tablet Commonly known as: PAXIL Take 1 tablet (10 mg total) by mouth daily.   Spiriva HandiHaler 18 MCG inhalation capsule Generic drug: tiotropium Place 1 capsule into inhaler and inhale daily.   traMADol 50 MG tablet Commonly known as: ULTRAM Take 50 mg by mouth 2 (two) times daily.      Relevant Imaging Results:  Relevant Lab Results:   Additional Information SSN: [id number]  Ihor Gully, LCSW

## 2019-05-24 NOTE — Care Management Important Message (Signed)
Important Message  Patient Details  Name: Karen Keller MRN: [id number] Date of Birth: 1955-05-20   Medicare Important Message Given:  Yes(copy placed at bedside)     Tommy Medal 05/24/2019, 4:20 PM

## 2019-05-25 ENCOUNTER — Other Ambulatory Visit: Payer: Self-pay

## 2019-05-25 NOTE — Patient Outreach (Addendum)
[facility]) Care Management  [id number]  Ruthie Kaycie Umbaugh Oct 29, 1955 [id number]     Transition of Care Referral  Referral Date: 05/25/2019 Referral Source: [facility] Discharge Report Date of Discharge: 05/24/2019 Facility: [place]: [facility]   Referral received. Transition of care calls being completed via EMMI-automated calls. RN CM will outreach patient for any red flags received.     Plan: RN CM will close case at this time.    Enzo Montgomery, RN,BSN,CCM [place] Management Telephonic Care Management Coordinator Direct Phone: [phone] Toll Free: [phone] Fax: [phone]

## 2019-05-26 DIAGNOSIS — J449 Chronic obstructive pulmonary disease, unspecified: Secondary | ICD-10-CM | POA: Diagnosis not present

## 2019-05-26 DIAGNOSIS — I129 Hypertensive chronic kidney disease with stage 1 through stage 4 chronic kidney disease, or unspecified chronic kidney disease: Secondary | ICD-10-CM | POA: Diagnosis not present

## 2019-05-26 DIAGNOSIS — F319 Bipolar disorder, unspecified: Secondary | ICD-10-CM | POA: Diagnosis not present

## 2019-05-26 DIAGNOSIS — G9341 Metabolic encephalopathy: Secondary | ICD-10-CM | POA: Diagnosis not present

## 2019-05-26 DIAGNOSIS — I6782 Cerebral ischemia: Secondary | ICD-10-CM | POA: Diagnosis not present

## 2019-05-26 DIAGNOSIS — K5909 Other constipation: Secondary | ICD-10-CM | POA: Diagnosis not present

## 2019-05-26 DIAGNOSIS — G40909 Epilepsy, unspecified, not intractable, without status epilepticus: Secondary | ICD-10-CM | POA: Diagnosis not present

## 2019-05-26 DIAGNOSIS — N1831 Chronic kidney disease, stage 3a: Secondary | ICD-10-CM | POA: Diagnosis not present

## 2019-05-26 DIAGNOSIS — F419 Anxiety disorder, unspecified: Secondary | ICD-10-CM | POA: Diagnosis not present

## 2019-05-30 DIAGNOSIS — F319 Bipolar disorder, unspecified: Secondary | ICD-10-CM | POA: Diagnosis not present

## 2019-05-30 DIAGNOSIS — N1831 Chronic kidney disease, stage 3a: Secondary | ICD-10-CM | POA: Diagnosis not present

## 2019-05-30 DIAGNOSIS — I129 Hypertensive chronic kidney disease with stage 1 through stage 4 chronic kidney disease, or unspecified chronic kidney disease: Secondary | ICD-10-CM | POA: Diagnosis not present

## 2019-05-30 DIAGNOSIS — G40909 Epilepsy, unspecified, not intractable, without status epilepticus: Secondary | ICD-10-CM | POA: Diagnosis not present

## 2019-05-30 DIAGNOSIS — N390 Urinary tract infection, site not specified: Secondary | ICD-10-CM | POA: Diagnosis not present

## 2019-05-30 DIAGNOSIS — I6782 Cerebral ischemia: Secondary | ICD-10-CM | POA: Diagnosis not present

## 2019-05-30 DIAGNOSIS — E870 Hyperosmolality and hypernatremia: Secondary | ICD-10-CM | POA: Diagnosis not present

## 2019-05-30 DIAGNOSIS — K5909 Other constipation: Secondary | ICD-10-CM | POA: Diagnosis not present

## 2019-05-30 DIAGNOSIS — F419 Anxiety disorder, unspecified: Secondary | ICD-10-CM | POA: Diagnosis not present

## 2019-05-30 DIAGNOSIS — J449 Chronic obstructive pulmonary disease, unspecified: Secondary | ICD-10-CM | POA: Diagnosis not present

## 2019-05-30 DIAGNOSIS — G9341 Metabolic encephalopathy: Secondary | ICD-10-CM | POA: Diagnosis not present

## 2019-05-31 DIAGNOSIS — N1831 Chronic kidney disease, stage 3a: Secondary | ICD-10-CM | POA: Diagnosis not present

## 2019-05-31 DIAGNOSIS — F419 Anxiety disorder, unspecified: Secondary | ICD-10-CM | POA: Diagnosis not present

## 2019-05-31 DIAGNOSIS — I6782 Cerebral ischemia: Secondary | ICD-10-CM | POA: Diagnosis not present

## 2019-05-31 DIAGNOSIS — J449 Chronic obstructive pulmonary disease, unspecified: Secondary | ICD-10-CM | POA: Diagnosis not present

## 2019-05-31 DIAGNOSIS — G9341 Metabolic encephalopathy: Secondary | ICD-10-CM | POA: Diagnosis not present

## 2019-05-31 DIAGNOSIS — F319 Bipolar disorder, unspecified: Secondary | ICD-10-CM | POA: Diagnosis not present

## 2019-05-31 DIAGNOSIS — I129 Hypertensive chronic kidney disease with stage 1 through stage 4 chronic kidney disease, or unspecified chronic kidney disease: Secondary | ICD-10-CM | POA: Diagnosis not present

## 2019-05-31 DIAGNOSIS — G40909 Epilepsy, unspecified, not intractable, without status epilepticus: Secondary | ICD-10-CM | POA: Diagnosis not present

## 2019-05-31 DIAGNOSIS — K5909 Other constipation: Secondary | ICD-10-CM | POA: Diagnosis not present

## 2019-06-02 DIAGNOSIS — N390 Urinary tract infection, site not specified: Secondary | ICD-10-CM | POA: Diagnosis not present

## 2019-06-02 DIAGNOSIS — I10 Essential (primary) hypertension: Secondary | ICD-10-CM | POA: Diagnosis not present

## 2019-06-02 DIAGNOSIS — J449 Chronic obstructive pulmonary disease, unspecified: Secondary | ICD-10-CM | POA: Diagnosis not present

## 2019-06-05 DIAGNOSIS — K5909 Other constipation: Secondary | ICD-10-CM | POA: Diagnosis not present

## 2019-06-05 DIAGNOSIS — G40909 Epilepsy, unspecified, not intractable, without status epilepticus: Secondary | ICD-10-CM | POA: Diagnosis not present

## 2019-06-05 DIAGNOSIS — N1831 Chronic kidney disease, stage 3a: Secondary | ICD-10-CM | POA: Diagnosis not present

## 2019-06-05 DIAGNOSIS — F319 Bipolar disorder, unspecified: Secondary | ICD-10-CM | POA: Diagnosis not present

## 2019-06-05 DIAGNOSIS — I6782 Cerebral ischemia: Secondary | ICD-10-CM | POA: Diagnosis not present

## 2019-06-05 DIAGNOSIS — I129 Hypertensive chronic kidney disease with stage 1 through stage 4 chronic kidney disease, or unspecified chronic kidney disease: Secondary | ICD-10-CM | POA: Diagnosis not present

## 2019-06-05 DIAGNOSIS — G9341 Metabolic encephalopathy: Secondary | ICD-10-CM | POA: Diagnosis not present

## 2019-06-05 DIAGNOSIS — F419 Anxiety disorder, unspecified: Secondary | ICD-10-CM | POA: Diagnosis not present

## 2019-06-05 DIAGNOSIS — J449 Chronic obstructive pulmonary disease, unspecified: Secondary | ICD-10-CM | POA: Diagnosis not present

## 2019-06-06 DIAGNOSIS — K5909 Other constipation: Secondary | ICD-10-CM | POA: Diagnosis not present

## 2019-06-06 DIAGNOSIS — I10 Essential (primary) hypertension: Secondary | ICD-10-CM | POA: Diagnosis not present

## 2019-06-06 DIAGNOSIS — J449 Chronic obstructive pulmonary disease, unspecified: Secondary | ICD-10-CM | POA: Diagnosis not present

## 2019-06-06 DIAGNOSIS — G40909 Epilepsy, unspecified, not intractable, without status epilepticus: Secondary | ICD-10-CM | POA: Diagnosis not present

## 2019-06-06 DIAGNOSIS — F319 Bipolar disorder, unspecified: Secondary | ICD-10-CM | POA: Diagnosis not present

## 2019-06-06 DIAGNOSIS — N1831 Chronic kidney disease, stage 3a: Secondary | ICD-10-CM | POA: Diagnosis not present

## 2019-06-06 DIAGNOSIS — N390 Urinary tract infection, site not specified: Secondary | ICD-10-CM | POA: Diagnosis not present

## 2019-06-06 DIAGNOSIS — I6782 Cerebral ischemia: Secondary | ICD-10-CM | POA: Diagnosis not present

## 2019-06-06 DIAGNOSIS — G9341 Metabolic encephalopathy: Secondary | ICD-10-CM | POA: Diagnosis not present

## 2019-06-06 DIAGNOSIS — F419 Anxiety disorder, unspecified: Secondary | ICD-10-CM | POA: Diagnosis not present

## 2019-06-06 DIAGNOSIS — I129 Hypertensive chronic kidney disease with stage 1 through stage 4 chronic kidney disease, or unspecified chronic kidney disease: Secondary | ICD-10-CM | POA: Diagnosis not present

## 2019-06-07 ENCOUNTER — Ambulatory Visit (HOSPITAL_COMMUNITY)
Admission: RE | Admit: 2019-06-07 | Discharge: 2019-06-07 | Disposition: A | Discharge status: Home / Self Care | Payer: Medicare HMO | Source: Ambulatory Visit | Attending: Internal Medicine | Admitting: Internal Medicine

## 2019-06-07 ENCOUNTER — Other Ambulatory Visit: Payer: Self-pay

## 2019-06-07 DIAGNOSIS — Z1231 Encounter for screening mammogram for malignant neoplasm of breast: Secondary | ICD-10-CM | POA: Diagnosis not present

## 2019-06-09 DIAGNOSIS — I6782 Cerebral ischemia: Secondary | ICD-10-CM | POA: Diagnosis not present

## 2019-06-09 DIAGNOSIS — F419 Anxiety disorder, unspecified: Secondary | ICD-10-CM | POA: Diagnosis not present

## 2019-06-09 DIAGNOSIS — F319 Bipolar disorder, unspecified: Secondary | ICD-10-CM | POA: Diagnosis not present

## 2019-06-09 DIAGNOSIS — N1831 Chronic kidney disease, stage 3a: Secondary | ICD-10-CM | POA: Diagnosis not present

## 2019-06-09 DIAGNOSIS — I129 Hypertensive chronic kidney disease with stage 1 through stage 4 chronic kidney disease, or unspecified chronic kidney disease: Secondary | ICD-10-CM | POA: Diagnosis not present

## 2019-06-09 DIAGNOSIS — K5909 Other constipation: Secondary | ICD-10-CM | POA: Diagnosis not present

## 2019-06-09 DIAGNOSIS — G40909 Epilepsy, unspecified, not intractable, without status epilepticus: Secondary | ICD-10-CM | POA: Diagnosis not present

## 2019-06-09 DIAGNOSIS — G9341 Metabolic encephalopathy: Secondary | ICD-10-CM | POA: Diagnosis not present

## 2019-06-09 DIAGNOSIS — J449 Chronic obstructive pulmonary disease, unspecified: Secondary | ICD-10-CM | POA: Diagnosis not present

## 2019-06-12 DIAGNOSIS — I129 Hypertensive chronic kidney disease with stage 1 through stage 4 chronic kidney disease, or unspecified chronic kidney disease: Secondary | ICD-10-CM | POA: Diagnosis not present

## 2019-06-12 DIAGNOSIS — F319 Bipolar disorder, unspecified: Secondary | ICD-10-CM | POA: Diagnosis not present

## 2019-06-12 DIAGNOSIS — F419 Anxiety disorder, unspecified: Secondary | ICD-10-CM | POA: Diagnosis not present

## 2019-06-12 DIAGNOSIS — G9341 Metabolic encephalopathy: Secondary | ICD-10-CM | POA: Diagnosis not present

## 2019-06-12 DIAGNOSIS — J449 Chronic obstructive pulmonary disease, unspecified: Secondary | ICD-10-CM | POA: Diagnosis not present

## 2019-06-12 DIAGNOSIS — K5909 Other constipation: Secondary | ICD-10-CM | POA: Diagnosis not present

## 2019-06-12 DIAGNOSIS — G40909 Epilepsy, unspecified, not intractable, without status epilepticus: Secondary | ICD-10-CM | POA: Diagnosis not present

## 2019-06-12 DIAGNOSIS — I6782 Cerebral ischemia: Secondary | ICD-10-CM | POA: Diagnosis not present

## 2019-06-12 DIAGNOSIS — N1831 Chronic kidney disease, stage 3a: Secondary | ICD-10-CM | POA: Diagnosis not present

## 2019-06-13 ENCOUNTER — Other Ambulatory Visit (HOSPITAL_COMMUNITY): Payer: Self-pay | Admitting: Internal Medicine

## 2019-06-13 DIAGNOSIS — I6782 Cerebral ischemia: Secondary | ICD-10-CM | POA: Diagnosis not present

## 2019-06-13 DIAGNOSIS — F419 Anxiety disorder, unspecified: Secondary | ICD-10-CM | POA: Diagnosis not present

## 2019-06-13 DIAGNOSIS — G9341 Metabolic encephalopathy: Secondary | ICD-10-CM | POA: Diagnosis not present

## 2019-06-13 DIAGNOSIS — I129 Hypertensive chronic kidney disease with stage 1 through stage 4 chronic kidney disease, or unspecified chronic kidney disease: Secondary | ICD-10-CM | POA: Diagnosis not present

## 2019-06-13 DIAGNOSIS — G40909 Epilepsy, unspecified, not intractable, without status epilepticus: Secondary | ICD-10-CM | POA: Diagnosis not present

## 2019-06-13 DIAGNOSIS — K5909 Other constipation: Secondary | ICD-10-CM | POA: Diagnosis not present

## 2019-06-13 DIAGNOSIS — J449 Chronic obstructive pulmonary disease, unspecified: Secondary | ICD-10-CM | POA: Diagnosis not present

## 2019-06-13 DIAGNOSIS — N1831 Chronic kidney disease, stage 3a: Secondary | ICD-10-CM | POA: Diagnosis not present

## 2019-06-13 DIAGNOSIS — R928 Other abnormal and inconclusive findings on diagnostic imaging of breast: Secondary | ICD-10-CM

## 2019-06-13 DIAGNOSIS — F319 Bipolar disorder, unspecified: Secondary | ICD-10-CM | POA: Diagnosis not present

## 2019-06-15 DIAGNOSIS — G40909 Epilepsy, unspecified, not intractable, without status epilepticus: Secondary | ICD-10-CM | POA: Diagnosis not present

## 2019-06-15 DIAGNOSIS — F419 Anxiety disorder, unspecified: Secondary | ICD-10-CM | POA: Diagnosis not present

## 2019-06-15 DIAGNOSIS — N1831 Chronic kidney disease, stage 3a: Secondary | ICD-10-CM | POA: Diagnosis not present

## 2019-06-15 DIAGNOSIS — F319 Bipolar disorder, unspecified: Secondary | ICD-10-CM | POA: Diagnosis not present

## 2019-06-15 DIAGNOSIS — F250 Schizoaffective disorder, bipolar type: Secondary | ICD-10-CM | POA: Diagnosis not present

## 2019-06-15 DIAGNOSIS — K5909 Other constipation: Secondary | ICD-10-CM | POA: Diagnosis not present

## 2019-06-15 DIAGNOSIS — J449 Chronic obstructive pulmonary disease, unspecified: Secondary | ICD-10-CM | POA: Diagnosis not present

## 2019-06-15 DIAGNOSIS — I6782 Cerebral ischemia: Secondary | ICD-10-CM | POA: Diagnosis not present

## 2019-06-15 DIAGNOSIS — G9341 Metabolic encephalopathy: Secondary | ICD-10-CM | POA: Diagnosis not present

## 2019-06-15 DIAGNOSIS — I129 Hypertensive chronic kidney disease with stage 1 through stage 4 chronic kidney disease, or unspecified chronic kidney disease: Secondary | ICD-10-CM | POA: Diagnosis not present

## 2019-06-20 DIAGNOSIS — G9341 Metabolic encephalopathy: Secondary | ICD-10-CM | POA: Diagnosis not present

## 2019-06-20 DIAGNOSIS — J449 Chronic obstructive pulmonary disease, unspecified: Secondary | ICD-10-CM | POA: Diagnosis not present

## 2019-06-20 DIAGNOSIS — I129 Hypertensive chronic kidney disease with stage 1 through stage 4 chronic kidney disease, or unspecified chronic kidney disease: Secondary | ICD-10-CM | POA: Diagnosis not present

## 2019-06-20 DIAGNOSIS — G40909 Epilepsy, unspecified, not intractable, without status epilepticus: Secondary | ICD-10-CM | POA: Diagnosis not present

## 2019-06-20 DIAGNOSIS — K5909 Other constipation: Secondary | ICD-10-CM | POA: Diagnosis not present

## 2019-06-20 DIAGNOSIS — F319 Bipolar disorder, unspecified: Secondary | ICD-10-CM | POA: Diagnosis not present

## 2019-06-20 DIAGNOSIS — N1831 Chronic kidney disease, stage 3a: Secondary | ICD-10-CM | POA: Diagnosis not present

## 2019-06-20 DIAGNOSIS — F419 Anxiety disorder, unspecified: Secondary | ICD-10-CM | POA: Diagnosis not present

## 2019-06-20 DIAGNOSIS — I6782 Cerebral ischemia: Secondary | ICD-10-CM | POA: Diagnosis not present

## 2019-06-21 ENCOUNTER — Other Ambulatory Visit: Payer: Self-pay | Admitting: Gastroenterology

## 2019-06-25 DIAGNOSIS — K5909 Other constipation: Secondary | ICD-10-CM | POA: Diagnosis not present

## 2019-06-25 DIAGNOSIS — G40909 Epilepsy, unspecified, not intractable, without status epilepticus: Secondary | ICD-10-CM | POA: Diagnosis not present

## 2019-06-25 DIAGNOSIS — F419 Anxiety disorder, unspecified: Secondary | ICD-10-CM | POA: Diagnosis not present

## 2019-06-25 DIAGNOSIS — N1831 Chronic kidney disease, stage 3a: Secondary | ICD-10-CM | POA: Diagnosis not present

## 2019-06-25 DIAGNOSIS — I6782 Cerebral ischemia: Secondary | ICD-10-CM | POA: Diagnosis not present

## 2019-06-25 DIAGNOSIS — G9341 Metabolic encephalopathy: Secondary | ICD-10-CM | POA: Diagnosis not present

## 2019-06-25 DIAGNOSIS — F319 Bipolar disorder, unspecified: Secondary | ICD-10-CM | POA: Diagnosis not present

## 2019-06-25 DIAGNOSIS — I129 Hypertensive chronic kidney disease with stage 1 through stage 4 chronic kidney disease, or unspecified chronic kidney disease: Secondary | ICD-10-CM | POA: Diagnosis not present

## 2019-06-25 DIAGNOSIS — J449 Chronic obstructive pulmonary disease, unspecified: Secondary | ICD-10-CM | POA: Diagnosis not present

## 2019-06-26 ENCOUNTER — Emergency Department (HOSPITAL_COMMUNITY)
Admission: EM | Admit: 2019-06-26 | Discharge: 2019-06-28 | Disposition: A | Discharge status: Other Acute Inpatient Hospital | Payer: Medicare HMO | Attending: Emergency Medicine | Admitting: Emergency Medicine

## 2019-06-26 ENCOUNTER — Encounter (HOSPITAL_COMMUNITY): Payer: Self-pay | Admitting: Emergency Medicine

## 2019-06-26 ENCOUNTER — Other Ambulatory Visit: Payer: Self-pay

## 2019-06-26 DIAGNOSIS — F419 Anxiety disorder, unspecified: Secondary | ICD-10-CM | POA: Diagnosis not present

## 2019-06-26 DIAGNOSIS — J449 Chronic obstructive pulmonary disease, unspecified: Secondary | ICD-10-CM | POA: Insufficient documentation

## 2019-06-26 DIAGNOSIS — F17210 Nicotine dependence, cigarettes, uncomplicated: Secondary | ICD-10-CM | POA: Insufficient documentation

## 2019-06-26 DIAGNOSIS — F23 Brief psychotic disorder: Secondary | ICD-10-CM | POA: Diagnosis not present

## 2019-06-26 DIAGNOSIS — J45909 Unspecified asthma, uncomplicated: Secondary | ICD-10-CM | POA: Diagnosis not present

## 2019-06-26 DIAGNOSIS — I129 Hypertensive chronic kidney disease with stage 1 through stage 4 chronic kidney disease, or unspecified chronic kidney disease: Secondary | ICD-10-CM | POA: Diagnosis not present

## 2019-06-26 DIAGNOSIS — R45851 Suicidal ideations: Secondary | ICD-10-CM | POA: Diagnosis not present

## 2019-06-26 DIAGNOSIS — Z79899 Other long term (current) drug therapy: Secondary | ICD-10-CM | POA: Diagnosis not present

## 2019-06-26 DIAGNOSIS — F259 Schizoaffective disorder, unspecified: Secondary | ICD-10-CM | POA: Diagnosis not present

## 2019-06-26 DIAGNOSIS — R9431 Abnormal electrocardiogram [ECG] [EKG]: Secondary | ICD-10-CM | POA: Diagnosis not present

## 2019-06-26 DIAGNOSIS — R45850 Homicidal ideations: Secondary | ICD-10-CM | POA: Insufficient documentation

## 2019-06-26 DIAGNOSIS — R441 Visual hallucinations: Secondary | ICD-10-CM | POA: Diagnosis present

## 2019-06-26 DIAGNOSIS — K5909 Other constipation: Secondary | ICD-10-CM | POA: Diagnosis not present

## 2019-06-26 DIAGNOSIS — G40909 Epilepsy, unspecified, not intractable, without status epilepticus: Secondary | ICD-10-CM | POA: Diagnosis not present

## 2019-06-26 DIAGNOSIS — G9341 Metabolic encephalopathy: Secondary | ICD-10-CM | POA: Diagnosis not present

## 2019-06-26 DIAGNOSIS — N1831 Chronic kidney disease, stage 3a: Secondary | ICD-10-CM | POA: Diagnosis not present

## 2019-06-26 DIAGNOSIS — I10 Essential (primary) hypertension: Secondary | ICD-10-CM | POA: Insufficient documentation

## 2019-06-26 DIAGNOSIS — J9811 Atelectasis: Secondary | ICD-10-CM | POA: Diagnosis not present

## 2019-06-26 DIAGNOSIS — I6782 Cerebral ischemia: Secondary | ICD-10-CM | POA: Diagnosis not present

## 2019-06-26 DIAGNOSIS — I700 Atherosclerosis of aorta: Secondary | ICD-10-CM | POA: Diagnosis not present

## 2019-06-26 DIAGNOSIS — Z20822 Contact with and (suspected) exposure to covid-19: Secondary | ICD-10-CM | POA: Insufficient documentation

## 2019-06-26 DIAGNOSIS — F319 Bipolar disorder, unspecified: Secondary | ICD-10-CM | POA: Diagnosis not present

## 2019-06-26 LAB — URINALYSIS, ROUTINE W REFLEX MICROSCOPIC
Bacteria, UA: NONE SEEN
Bilirubin Urine: NEGATIVE
Glucose, UA: NEGATIVE mg/dL
Hgb urine dipstick: NEGATIVE
Ketones, ur: NEGATIVE mg/dL
Nitrite: NEGATIVE
Protein, ur: NEGATIVE mg/dL
Specific Gravity, Urine: 1.002 — ABNORMAL LOW (ref 1.005–1.030)
pH: 6 (ref 5.0–8.0)

## 2019-06-26 LAB — RAPID URINE DRUG SCREEN, HOSP PERFORMED
Amphetamines: NOT DETECTED
Barbiturates: NOT DETECTED
Benzodiazepines: NOT DETECTED
Cocaine: NOT DETECTED
Opiates: NOT DETECTED
Tetrahydrocannabinol: NOT DETECTED

## 2019-06-26 LAB — VALPROIC ACID LEVEL: Valproic Acid Lvl: <10 ug/mL — ABNORMAL LOW (ref 50.0–100.0)

## 2019-06-26 LAB — SALICYLATE LEVEL: Salicylate Lvl: <7 mg/dL — ABNORMAL LOW (ref 7.0–30.0)

## 2019-06-26 LAB — COMPREHENSIVE METABOLIC PANEL
ALT: 23 U/L (ref 0–44)
AST: 27 U/L (ref 15–41)
Albumin: 3.8 g/dL (ref 3.5–5.0)
Alkaline Phosphatase: 120 U/L (ref 38–126)
Anion gap: 9 (ref 5–15)
BUN: 15 mg/dL (ref 8–23)
CO2: 26 mmol/L (ref 22–32)
Calcium: 9.4 mg/dL (ref 8.9–10.3)
Chloride: 103 mmol/L (ref 98–111)
Creatinine, Ser: 1.05 mg/dL — ABNORMAL HIGH (ref 0.44–1.00)
GFR calc Af Amer: >60 mL/min (ref >60)
GFR calc non Af Amer: 56 mL/min — ABNORMAL LOW (ref >60)
Glucose, Bld: 101 mg/dL — ABNORMAL HIGH (ref 70–99)
Potassium: 3.6 mmol/L (ref 3.5–5.1)
Sodium: 138 mmol/L (ref 135–145)
Total Bilirubin: 0.5 mg/dL (ref 0.3–1.2)
Total Protein: 6.9 g/dL (ref 6.5–8.1)

## 2019-06-26 LAB — CBC
HCT: 38.5 % (ref 36.0–46.0)
Hemoglobin: 11.9 g/dL — ABNORMAL LOW (ref 12.0–15.0)
MCH: 32.4 pg (ref 26.0–34.0)
MCHC: 30.9 g/dL (ref 30.0–36.0)
MCV: 104.9 fL — ABNORMAL HIGH (ref 80.0–100.0)
Platelets: 327 K/uL (ref 150–400)
RBC: 3.67 MIL/uL — ABNORMAL LOW (ref 3.87–5.11)
RDW: 15 % (ref 11.5–15.5)
WBC: 9.1 K/uL (ref 4.0–10.5)
nRBC: 0 % (ref 0.0–0.2)

## 2019-06-26 LAB — ACETAMINOPHEN LEVEL: Acetaminophen (Tylenol), Serum: <10 ug/mL — ABNORMAL LOW (ref 10–30)

## 2019-06-26 LAB — LITHIUM LEVEL: Lithium Lvl: <0.06 mmol/L — ABNORMAL LOW (ref 0.60–1.20)

## 2019-06-26 LAB — RESPIRATORY PANEL BY RT PCR (FLU A&B, COVID)
Influenza A by PCR: NEGATIVE
Influenza B by PCR: NEGATIVE
SARS Coronavirus 2 by RT PCR: NEGATIVE

## 2019-06-26 LAB — ETHANOL: Alcohol, Ethyl (B): <10 mg/dL

## 2019-06-26 MED ORDER — LITHIUM CARBONATE 300 MG PO CAPS
450.0000 mg | ORAL_CAPSULE | Freq: Every day | ORAL | Status: DC
  Administered 2019-06-27: 450 mg via ORAL
  Filled 2019-06-26 (×2): qty 1

## 2019-06-26 MED ORDER — ACETAMINOPHEN 325 MG PO TABS
650.0000 mg | ORAL_TABLET | Freq: Once | ORAL | Status: AC
  Administered 2019-06-26: 650 mg via ORAL
  Filled 2019-06-26: qty 2

## 2019-06-26 MED ORDER — B COMPLEX-C PO TABS
1.0000 | ORAL_TABLET | Freq: Every day | ORAL | Status: DC
  Administered 2019-06-27: 1 via ORAL
  Filled 2019-06-26 (×4): qty 1

## 2019-06-26 MED ORDER — LITHIUM CARBONATE 300 MG PO CAPS
300.0000 mg | ORAL_CAPSULE | Freq: Every morning | ORAL | Status: DC
  Administered 2019-06-27: 300 mg via ORAL
  Filled 2019-06-26: qty 1

## 2019-06-26 MED ORDER — OXYBUTYNIN CHLORIDE ER 5 MG PO TB24
10.0000 mg | ORAL_TABLET | Freq: Every day | ORAL | Status: DC
  Administered 2019-06-27: 10 mg via ORAL
  Filled 2019-06-26 (×2): qty 2
  Filled 2019-06-26 (×2): qty 1

## 2019-06-26 MED ORDER — LINACLOTIDE 145 MCG PO CAPS
145.0000 ug | ORAL_CAPSULE | Freq: Every day | ORAL | Status: DC
  Administered 2019-06-27: 145 ug via ORAL
  Filled 2019-06-26 (×4): qty 1

## 2019-06-26 MED ORDER — ADULT MULTIVITAMIN W/MINERALS CH
1.0000 | ORAL_TABLET | Freq: Every day | ORAL | Status: DC
  Administered 2019-06-27: 1 via ORAL
  Filled 2019-06-26: qty 1

## 2019-06-26 MED ORDER — DIVALPROEX SODIUM ER 500 MG PO TB24
500.0000 mg | ORAL_TABLET | Freq: Every day | ORAL | Status: DC
  Administered 2019-06-27 (×2): 500 mg via ORAL
  Filled 2019-06-26 (×2): qty 1

## 2019-06-26 MED ORDER — DOCUSATE SODIUM 100 MG PO CAPS
100.0000 mg | ORAL_CAPSULE | Freq: Every day | ORAL | Status: DC
  Administered 2019-06-27: 100 mg via ORAL
  Filled 2019-06-26 (×2): qty 1

## 2019-06-26 MED ORDER — ACETAMINOPHEN 325 MG PO TABS
325.0000 mg | ORAL_TABLET | Freq: Three times a day (TID) | ORAL | Status: DC
  Administered 2019-06-27 – 2019-06-28 (×5): 325 mg via ORAL
  Filled 2019-06-26 (×5): qty 1

## 2019-06-26 MED ORDER — DIVALPROEX SODIUM ER 250 MG PO TB24
250.0000 mg | ORAL_TABLET | Freq: Every day | ORAL | Status: DC
  Administered 2019-06-27: 250 mg via ORAL
  Filled 2019-06-26 (×2): qty 1

## 2019-06-26 MED ORDER — HYDRALAZINE HCL 25 MG PO TABS
25.0000 mg | ORAL_TABLET | Freq: Four times a day (QID) | ORAL | Status: DC
  Administered 2019-06-27 (×5): 25 mg via ORAL
  Filled 2019-06-26 (×5): qty 1

## 2019-06-26 MED ORDER — PAROXETINE HCL 10 MG PO TABS
10.0000 mg | ORAL_TABLET | Freq: Every day | ORAL | Status: DC
  Administered 2019-06-27: 10 mg via ORAL
  Filled 2019-06-26 (×4): qty 1

## 2019-06-26 MED ORDER — TRAMADOL HCL 50 MG PO TABS
50.0000 mg | ORAL_TABLET | Freq: Two times a day (BID) | ORAL | Status: DC
  Administered 2019-06-27 (×3): 50 mg via ORAL
  Filled 2019-06-26 (×3): qty 1

## 2019-06-26 MED ORDER — TIOTROPIUM BROMIDE MONOHYDRATE 18 MCG IN CAPS: 1.0000 | ORAL_CAPSULE | Freq: Every day | RESPIRATORY_TRACT | Status: DC

## 2019-06-26 MED ORDER — HALOPERIDOL 2 MG PO TABS
2.0000 mg | ORAL_TABLET | Freq: Two times a day (BID) | ORAL | Status: DC
  Administered 2019-06-27 (×3): 2 mg via ORAL
  Filled 2019-06-26 (×9): qty 1

## 2019-06-26 MED ORDER — PANTOPRAZOLE SODIUM 40 MG PO TBEC
40.0000 mg | DELAYED_RELEASE_TABLET | Freq: Every day | ORAL | Status: DC
  Administered 2019-06-27: 40 mg via ORAL
  Filled 2019-06-26 (×2): qty 1
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
## 2019-06-26 NOTE — ED Triage Notes (Signed)
Pt resident of [facility].  Staff reports pt suicidal and homicidal towards staff today.

## 2019-06-26 NOTE — ED Notes (Signed)
TTS Assessment completed. Renetta Chalk, NP, patient meets inpatient treatment. TTS to secure placement.
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
## 2019-06-26 NOTE — Progress Notes (Signed)
Patient meets inpatient criteria per Renetta Chalk, NP. Patient has been faxed out to the following facilities for review:   [facility]  CSW will continue to follow and seek bed placement.   Domenic Schwab, MSW, LCSW-A Clinical Disposition Social Worker [facility]/TTS [phone]

## 2019-06-27 ENCOUNTER — Encounter (HOSPITAL_COMMUNITY): Payer: Medicare HMO

## 2019-06-27 ENCOUNTER — Ambulatory Visit (HOSPITAL_COMMUNITY): Admission: RE | Admit: 2019-06-27 | Payer: Medicare HMO | Source: Ambulatory Visit

## 2019-06-27 ENCOUNTER — Encounter (HOSPITAL_COMMUNITY): Payer: Self-pay

## 2019-06-27 ENCOUNTER — Emergency Department (HOSPITAL_COMMUNITY): Payer: Medicare HMO

## 2019-06-27 DIAGNOSIS — I700 Atherosclerosis of aorta: Secondary | ICD-10-CM | POA: Diagnosis not present

## 2019-06-27 DIAGNOSIS — J9811 Atelectasis: Secondary | ICD-10-CM | POA: Diagnosis not present

## 2019-06-27 MED ORDER — QUETIAPINE FUMARATE 25 MG PO TABS: 25.0000 mg | ORAL_TABLET | Freq: Every day | ORAL | Status: DC

## 2019-06-27 MED ORDER — UMECLIDINIUM BROMIDE 62.5 MCG/INH IN AEPB
1.0000 | INHALATION_SPRAY | Freq: Every day | RESPIRATORY_TRACT | Status: DC
  Administered 2019-06-27: 1 via RESPIRATORY_TRACT
  Filled 2019-06-27: qty 7

## 2019-06-27 MED ORDER — DIVALPROEX SODIUM ER 500 MG PO TB24: 500.0000 mg | ORAL_TABLET | Freq: Every day | ORAL | Status: DC

## 2019-06-27 NOTE — ED Notes (Signed)
Pt ambulatory to bathroom

## 2019-06-27 NOTE — Progress Notes (Addendum)
CSW spoke with Caryl Pina at [facility]. They are reviewing pt and request her EKG, Covid, and chest x-ray results. [facility] RN notified. EKG and Covid results have been faxed to [facility].   Pt is her own guardian. RN will talk with ED provider about placing pt under IVC.   Audree Camel, LCSW, [place] Disposition [facility]/TTS [phone]

## 2019-06-27 NOTE — Progress Notes (Signed)
Pt accepted to [facility]   Dr. Hulan Amato is the accepting/attending provider.    Call report to [phone]  An IVC is in progress and pt will be transported by law enforcement.   Pt is scheduled to arrive at [facility] after 8am on 4/21   Rhae Hammock Disposition [facility]/TTS [phone]
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
## 2019-06-28 DIAGNOSIS — F339 Major depressive disorder, recurrent, unspecified: Secondary | ICD-10-CM | POA: Diagnosis not present

## 2019-06-28 DIAGNOSIS — J45909 Unspecified asthma, uncomplicated: Secondary | ICD-10-CM | POA: Diagnosis not present

## 2019-06-28 DIAGNOSIS — J449 Chronic obstructive pulmonary disease, unspecified: Secondary | ICD-10-CM | POA: Diagnosis not present

## 2019-06-28 DIAGNOSIS — R945 Abnormal results of liver function studies: Secondary | ICD-10-CM | POA: Diagnosis not present

## 2019-06-28 DIAGNOSIS — F39 Unspecified mood [affective] disorder: Secondary | ICD-10-CM | POA: Diagnosis not present

## 2019-06-28 DIAGNOSIS — R779 Abnormality of plasma protein, unspecified: Secondary | ICD-10-CM | POA: Diagnosis not present

## 2019-06-28 DIAGNOSIS — G40909 Epilepsy, unspecified, not intractable, without status epilepticus: Secondary | ICD-10-CM | POA: Diagnosis not present

## 2019-06-28 DIAGNOSIS — R45851 Suicidal ideations: Secondary | ICD-10-CM | POA: Diagnosis not present

## 2019-06-28 DIAGNOSIS — R718 Other abnormality of red blood cells: Secondary | ICD-10-CM | POA: Diagnosis not present

## 2019-06-28 DIAGNOSIS — F29 Unspecified psychosis not due to a substance or known physiological condition: Secondary | ICD-10-CM | POA: Diagnosis not present

## 2019-06-28 DIAGNOSIS — R42 Dizziness and giddiness: Secondary | ICD-10-CM | POA: Diagnosis not present

## 2019-06-28 DIAGNOSIS — Z79899 Other long term (current) drug therapy: Secondary | ICD-10-CM | POA: Diagnosis not present

## 2019-06-28 DIAGNOSIS — F319 Bipolar disorder, unspecified: Secondary | ICD-10-CM | POA: Diagnosis not present

## 2019-06-28 DIAGNOSIS — I10 Essential (primary) hypertension: Secondary | ICD-10-CM | POA: Diagnosis not present

## 2019-06-28 DIAGNOSIS — F323 Major depressive disorder, single episode, severe with psychotic features: Secondary | ICD-10-CM | POA: Diagnosis not present

## 2019-06-28 DIAGNOSIS — K5900 Constipation, unspecified: Secondary | ICD-10-CM | POA: Diagnosis not present

## 2019-06-28 DIAGNOSIS — F259 Schizoaffective disorder, unspecified: Secondary | ICD-10-CM | POA: Diagnosis not present

## 2019-06-28 NOTE — ED Provider Notes (Signed)
Patient is ready to be transferred.   Rolland Porter, MD 06/28/19 [phone]

## 2019-06-28 NOTE — ED Provider Notes (Signed)
Patient has been accepted at [facility] by Dr. Pat Patrick.  Patient was sleeping but easily arousable.  She was made aware of the plan and she is agreeable.   Rolland Porter, MD 06/28/19 [phone]

## 2019-06-30 DIAGNOSIS — I10 Essential (primary) hypertension: Secondary | ICD-10-CM | POA: Diagnosis not present

## 2019-06-30 DIAGNOSIS — F319 Bipolar disorder, unspecified: Secondary | ICD-10-CM | POA: Diagnosis not present
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
## 2019-08-21 ENCOUNTER — Ambulatory Visit: Payer: Medicare HMO | Admitting: Gastroenterology

## 2023-02-24 ENCOUNTER — Encounter (INDEPENDENT_AMBULATORY_CARE_PROVIDER_SITE_OTHER): Payer: Self-pay | Admitting: *Deleted
